# Patient Record
Sex: Female | Born: 1975 | Hispanic: Yes | Smoking: Never smoker
Health system: Southern US, Community
[De-identification: ages and names within clinical notes are randomized; demographics above are authoritative.]

## PROBLEM LIST (undated history)

## (undated) ENCOUNTER — Inpatient Hospital Stay (HOSPITAL_COMMUNITY): Payer: Self-pay

## (undated) DIAGNOSIS — R001 Bradycardia, unspecified: Secondary | ICD-10-CM

## (undated) DIAGNOSIS — D649 Anemia, unspecified: Secondary | ICD-10-CM

## (undated) DIAGNOSIS — T8332XA Displacement of intrauterine contraceptive device, initial encounter: Secondary | ICD-10-CM

## (undated) DIAGNOSIS — N83209 Unspecified ovarian cyst, unspecified side: Secondary | ICD-10-CM

## (undated) DIAGNOSIS — E039 Hypothyroidism, unspecified: Secondary | ICD-10-CM

## (undated) DIAGNOSIS — J189 Pneumonia, unspecified organism: Secondary | ICD-10-CM

## (undated) DIAGNOSIS — B9689 Other specified bacterial agents as the cause of diseases classified elsewhere: Secondary | ICD-10-CM

## (undated) DIAGNOSIS — K219 Gastro-esophageal reflux disease without esophagitis: Secondary | ICD-10-CM

## (undated) DIAGNOSIS — N939 Abnormal uterine and vaginal bleeding, unspecified: Secondary | ICD-10-CM

## (undated) DIAGNOSIS — T8389XA Other specified complication of genitourinary prosthetic devices, implants and grafts, initial encounter: Secondary | ICD-10-CM

## (undated) DIAGNOSIS — R7303 Prediabetes: Secondary | ICD-10-CM

## (undated) DIAGNOSIS — N760 Acute vaginitis: Secondary | ICD-10-CM

---
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
## 2001-06-30 ENCOUNTER — Ambulatory Visit (HOSPITAL_COMMUNITY): Admission: RE | Admit: 2001-06-30 | Discharge: 2001-06-30 | Payer: Self-pay | Admitting: *Deleted

## 2001-10-24 ENCOUNTER — Inpatient Hospital Stay (HOSPITAL_COMMUNITY): Admission: AD | Admit: 2001-10-24 | Discharge: 2001-10-24 | Payer: Self-pay | Admitting: *Deleted

## 2001-11-18 ENCOUNTER — Inpatient Hospital Stay (HOSPITAL_COMMUNITY): Admission: AD | Admit: 2001-11-18 | Discharge: 2001-11-20 | Payer: Self-pay | Admitting: *Deleted

## 2007-12-20 LAB — CONVERTED CEMR LAB: Pap Smear: NORMAL

## 2008-12-14 ENCOUNTER — Ambulatory Visit: Payer: Self-pay | Admitting: Diagnostic Radiology

## 2008-12-14 ENCOUNTER — Ambulatory Visit: Payer: Self-pay | Admitting: Internal Medicine

## 2008-12-14 ENCOUNTER — Emergency Department (HOSPITAL_BASED_OUTPATIENT_CLINIC_OR_DEPARTMENT_OTHER): Admission: EM | Admit: 2008-12-14 | Discharge: 2008-12-14 | Payer: Self-pay | Admitting: Emergency Medicine

## 2008-12-14 DIAGNOSIS — D649 Anemia, unspecified: Secondary | ICD-10-CM | POA: Insufficient documentation

## 2008-12-14 DIAGNOSIS — K8020 Calculus of gallbladder without cholecystitis without obstruction: Secondary | ICD-10-CM | POA: Insufficient documentation

## 2008-12-24 ENCOUNTER — Ambulatory Visit: Payer: Self-pay | Admitting: Diagnostic Radiology

## 2008-12-24 ENCOUNTER — Encounter (INDEPENDENT_AMBULATORY_CARE_PROVIDER_SITE_OTHER): Payer: Self-pay | Admitting: Surgery

## 2008-12-24 ENCOUNTER — Encounter: Payer: Self-pay | Admitting: Emergency Medicine

## 2008-12-24 ENCOUNTER — Inpatient Hospital Stay (HOSPITAL_COMMUNITY): Admission: EM | Admit: 2008-12-24 | Discharge: 2008-12-25 | Payer: Self-pay | Admitting: Surgery

## 2009-07-10 ENCOUNTER — Ambulatory Visit (HOSPITAL_COMMUNITY): Admission: RE | Admit: 2009-07-10 | Discharge: 2009-07-10 | Payer: Self-pay | Admitting: Internal Medicine

## 2009-07-17 ENCOUNTER — Encounter: Admission: RE | Admit: 2009-07-17 | Discharge: 2009-07-17 | Payer: Self-pay | Admitting: Internal Medicine

## 2010-03-23 HISTORY — PX: LAPAROSCOPIC CHOLECYSTECTOMY: SUR755

## 2010-06-26 LAB — COMPREHENSIVE METABOLIC PANEL
Albumin: 4 g/dL (ref 3.5–5.2)
BUN: 11 mg/dL (ref 6–23)
Calcium: 8.7 mg/dL (ref 8.4–10.5)
Glucose, Bld: 90 mg/dL (ref 70–99)
Total Protein: 7.4 g/dL (ref 6.0–8.3)

## 2010-06-26 LAB — CBC
HCT: 34.3 % — ABNORMAL LOW (ref 36.0–46.0)
Hemoglobin: 11.3 g/dL — ABNORMAL LOW (ref 12.0–15.0)
MCHC: 33 g/dL (ref 30.0–36.0)
Platelets: 297 10*3/uL (ref 150–400)
RDW: 16.5 % — ABNORMAL HIGH (ref 11.5–15.5)

## 2010-06-26 LAB — PROTIME-INR
INR: 0.9 (ref 0.00–1.49)
Prothrombin Time: 12.5 seconds (ref 11.6–15.2)

## 2010-06-26 LAB — URINALYSIS, ROUTINE W REFLEX MICROSCOPIC
Nitrite: NEGATIVE
Protein, ur: NEGATIVE mg/dL
Urobilinogen, UA: 0.2 mg/dL (ref 0.0–1.0)

## 2010-06-26 LAB — DIFFERENTIAL
Lymphocytes Relative: 44 % (ref 12–46)
Lymphs Abs: 2.7 10*3/uL (ref 0.7–4.0)
Monocytes Absolute: 0.5 10*3/uL (ref 0.1–1.0)
Monocytes Relative: 8 % (ref 3–12)
Neutro Abs: 2.8 10*3/uL (ref 1.7–7.7)
Neutrophils Relative %: 46 % (ref 43–77)

## 2010-06-26 LAB — URINE MICROSCOPIC-ADD ON

## 2010-06-26 LAB — PREGNANCY, URINE: Preg Test, Ur: NEGATIVE

## 2010-06-27 LAB — URINALYSIS, ROUTINE W REFLEX MICROSCOPIC
Ketones, ur: NEGATIVE mg/dL
Nitrite: NEGATIVE
Protein, ur: NEGATIVE mg/dL

## 2010-06-27 LAB — COMPREHENSIVE METABOLIC PANEL
Alkaline Phosphatase: 73 U/L (ref 39–117)
BUN: 13 mg/dL (ref 6–23)
Glucose, Bld: 99 mg/dL (ref 70–99)
Potassium: 3.9 mEq/L (ref 3.5–5.1)
Total Bilirubin: 0.5 mg/dL (ref 0.3–1.2)
Total Protein: 7.4 g/dL (ref 6.0–8.3)

## 2010-06-27 LAB — POCT CARDIAC MARKERS
CKMB, poc: <1 ng/mL — ABNORMAL LOW (ref 1.0–8.0)
Troponin i, poc: <0.05 ng/mL (ref 0.00–0.09)

## 2010-06-27 LAB — PREGNANCY, URINE: Preg Test, Ur: NEGATIVE

## 2010-06-27 LAB — DIFFERENTIAL
Basophils Absolute: 0 10*3/uL (ref 0.0–0.1)
Basophils Relative: 1 % (ref 0–1)
Monocytes Relative: 9 % (ref 3–12)
Neutro Abs: 4.2 10*3/uL (ref 1.7–7.7)
Neutrophils Relative %: 48 % (ref 43–77)

## 2010-06-27 LAB — CBC
HCT: 33.7 % — ABNORMAL LOW (ref 36.0–46.0)
Hemoglobin: 11.1 g/dL — ABNORMAL LOW (ref 12.0–15.0)
MCV: 77.2 fL — ABNORMAL LOW (ref 78.0–100.0)
RDW: 16.2 % — ABNORMAL HIGH (ref 11.5–15.5)

## 2011-01-31 ENCOUNTER — Encounter: Payer: Self-pay | Admitting: Emergency Medicine

## 2011-01-31 ENCOUNTER — Inpatient Hospital Stay (HOSPITAL_BASED_OUTPATIENT_CLINIC_OR_DEPARTMENT_OTHER)
Admission: AD | Admit: 2011-01-31 | Discharge: 2011-01-31 | Disposition: A | Discharge status: Home / Self Care | Payer: Self-pay | Attending: Obstetrics & Gynecology | Admitting: Obstetrics & Gynecology

## 2011-01-31 ENCOUNTER — Emergency Department (INDEPENDENT_AMBULATORY_CARE_PROVIDER_SITE_OTHER): Payer: Self-pay

## 2011-01-31 ENCOUNTER — Inpatient Hospital Stay (HOSPITAL_COMMUNITY): Payer: Self-pay

## 2011-01-31 DIAGNOSIS — R1032 Left lower quadrant pain: Secondary | ICD-10-CM

## 2011-01-31 DIAGNOSIS — B9689 Other specified bacterial agents as the cause of diseases classified elsewhere: Secondary | ICD-10-CM | POA: Insufficient documentation

## 2011-01-31 DIAGNOSIS — Z30431 Encounter for routine checking of intrauterine contraceptive device: Secondary | ICD-10-CM | POA: Insufficient documentation

## 2011-01-31 DIAGNOSIS — N83209 Unspecified ovarian cyst, unspecified side: Secondary | ICD-10-CM

## 2011-01-31 DIAGNOSIS — A499 Bacterial infection, unspecified: Secondary | ICD-10-CM | POA: Insufficient documentation

## 2011-01-31 DIAGNOSIS — R52 Pain, unspecified: Secondary | ICD-10-CM

## 2011-01-31 DIAGNOSIS — N760 Acute vaginitis: Secondary | ICD-10-CM | POA: Insufficient documentation

## 2011-01-31 LAB — CBC
MCV: 77 fL — ABNORMAL LOW (ref 78.0–100.0)
Platelets: 302 10*3/uL (ref 150–400)
RBC: 4.26 MIL/uL (ref 3.87–5.11)
WBC: 7.9 10*3/uL (ref 4.0–10.5)

## 2011-01-31 LAB — WET PREP, GENITAL: Trich, Wet Prep: NONE SEEN

## 2011-01-31 LAB — BASIC METABOLIC PANEL
CO2: 26 mEq/L (ref 19–32)
Chloride: 105 mEq/L (ref 96–112)
Creatinine, Ser: 0.7 mg/dL (ref 0.50–1.10)
Potassium: 3.9 mEq/L (ref 3.5–5.1)

## 2011-01-31 LAB — URINALYSIS, ROUTINE W REFLEX MICROSCOPIC
Hgb urine dipstick: NEGATIVE
Leukocytes, UA: NEGATIVE
Protein, ur: NEGATIVE mg/dL
Urobilinogen, UA: 0.2 mg/dL (ref 0.0–1.0)

## 2011-01-31 MED ORDER — IBUPROFEN 800 MG PO TABS: 800.0000 mg | ORAL_TABLET | Freq: Once | ORAL | Status: DC

## 2011-01-31 MED ORDER — IOHEXOL 300 MG/ML  SOLN: 100.0000 mL | Freq: Once | INTRAMUSCULAR | Status: DC | PRN

## 2011-01-31 MED ORDER — MORPHINE SULFATE 4 MG/ML IJ SOLN
4.0000 mg | Freq: Once | INTRAMUSCULAR | Status: AC
  Administered 2011-01-31: 4 mg via INTRAVENOUS
  Filled 2011-01-31: qty 1

## 2011-01-31 MED ORDER — ONDANSETRON HCL 4 MG/2ML IJ SOLN
4.0000 mg | Freq: Once | INTRAMUSCULAR | Status: AC
  Administered 2011-01-31: 4 mg via INTRAVENOUS
  Filled 2011-01-31: qty 2

## 2011-01-31 MED ORDER — HYDROCODONE-ACETAMINOPHEN 5-500 MG PO TABS: 1.0000 | ORAL_TABLET | ORAL | Status: AC | PRN

## 2011-01-31 NOTE — Progress Notes (Signed)
Pt came [facility] for an [place]. Pt presents [place] with complaints of left sided abdominal pain that she noticed 2 months ago and never went to the Dr. Rock Nephew says the pain was so bad today that she had to come to the Dr. The pain is constant and hurts worse when she is walking. Pt rates the pain 7/10

## 2011-01-31 NOTE — ED Notes (Signed)
Report to [place] at [facility].

## 2011-01-31 NOTE — ED Notes (Signed)
Pt c/o middle lower abd pain, onset this am; denies vaginal bleeding or d/c

## 2011-01-31 NOTE — ED Provider Notes (Signed)
US Transvaginal Non-ob  01/31/2011  *RADIOLOGY REPORT*  Clinical Data: Pelvic pain.  TRANSABDOMINAL AND TRANSVAGINAL ULTRASOUND OF PELVIS Technique:  Both transabdominal and transvaginal ultrasound examinations of the pelvis were performed. Transabdominal technique was performed for global imaging of the pelvis including uterus, ovaries, adnexal regions, and pelvic cul-de-sac.  Comparison: CT scan, same date.   It was necessary to proceed with endovaginal exam following the transabdominal exam to visualize the ovaries and endometrium.  Findings:  Uterus: Measures 9.1 x 5.5 x 5.9 cm.  No myometrial abnormalities.  Endometrium: Upper limits of normal in thickness measuring 13.3 mm. There is an IUD in the endometrial canal in the lower uterine segment.  It appears to protrude into the myometrium on both sides. In retrospect I think this is evident on the CT scan also.  Right ovary:  Measures 4.1 x 2.1 x 2.1 cm.  No cysts or masses. A corpus luteum cyst is noted.Normal blood flow.  Left ovary: Measures 3.8 x 2.0 x 3.2 cm.  No cysts or masses. Normal blood flow.  Other findings: A trace amount of free pelvic fluid is noted.  IMPRESSION:  1.  The IUD is in the lower uterine segment and the "T" portion appears to protrude into the myometrium on both sides. 2.  Normal ovaries. 3.  Trace free pelvic fluid.  Original Report Authenticated By: P. Loralie Champagne, M.D.   US Pelvis Complete  01/31/2011  *RADIOLOGY REPORT*  Clinical Data: Pelvic pain.  TRANSABDOMINAL AND TRANSVAGINAL ULTRASOUND OF PELVIS Technique:  Both transabdominal and transvaginal ultrasound examinations of the pelvis were performed. Transabdominal technique was performed for global imaging of the pelvis including uterus, ovaries, adnexal regions, and pelvic cul-de-sac.  Comparison: CT scan, same date.   It was necessary to proceed with endovaginal exam following the transabdominal exam to visualize the ovaries and endometrium.  Findings:  Uterus:  Measures 9.1 x 5.5 x 5.9 cm.  No myometrial abnormalities.  Endometrium: Upper limits of normal in thickness measuring 13.3 mm. There is an IUD in the endometrial canal in the lower uterine segment.  It appears to protrude into the myometrium on both sides. In retrospect I think this is evident on the CT scan also.  Right ovary:  Measures 4.1 x 2.1 x 2.1 cm.  No cysts or masses. A corpus luteum cyst is noted.Normal blood flow.  Left ovary: Measures 3.8 x 2.0 x 3.2 cm.  No cysts or masses. Normal blood flow.  Other findings: A trace amount of free pelvic fluid is noted.  IMPRESSION:  1.  The IUD is in the lower uterine segment and the "T" portion appears to protrude into the myometrium on both sides. 2.  Normal ovaries. 3.  Trace free pelvic fluid.  Original Report Authenticated By: P. Loralie Champagne, M.D.   Ct Abdomen Pelvis W Contrast  01/31/2011  *RADIOLOGY REPORT*  Clinical Data: Sudden onset left lower quadrant abdominal pain, prior cholecystectomy  CT ABDOMEN AND PELVIS WITH CONTRAST  Technique:  Multidetector CT imaging of the abdomen and pelvis was performed following the standard protocol during bolus administration of intravenous contrast.  Contrast:  100 ml Omnipaque-300 IV  Comparison: None.  Findings: Lung bases are clear.  Liver, spleen, pancreas, and adrenal glands are within normal limits.  Status post cholecystectomy.  No intrahepatic or extrahepatic ductal dilatation.  Kidneys within normal limits.  No hydronephrosis.  No evidence of bowel obstruction.  Normal appendix.  Mild colonic diverticulosis, without associated inflammatory changes.  No evidence of  abdominal aortic aneurysm.  No suspicious abdominopelvic lymphadenopathy.  Moderate pelvic ascites, possibly complicated by hemorrhage.  Uterus is mildly heterogeneous.  Indwelling IUD in the lower uterine segment. The right ovary is unremarkable.  Left ovary is notable for a 2.7x 2.0 cm collapsing corpus luteal cyst (series 2/image 62).   Bladder is within normal limits.  Visualized osseous structures are within normal limits.  IMPRESSION: 2.7 cm collapsing left ovarian corpus luteal cyst.  Moderate pelvic ascites, possibly complicated by hemorrhage.  IUD in the lower uterine segment.  Clinical correlation is suggested.  Original Report Authenticated By: Charline Bills, M.D.    In light of the newly discovered malpositioned IUD, if pt's pain is still persistent in 2-3 weeks, she is advised to seek treatment to have it replaced.  Dr. Marice Potter consulted.

## 2011-01-31 NOTE — ED Provider Notes (Signed)
History     CSN: [id number] Arrival date & time: 01/31/2011 10:57 AM   First MD Initiated Contact with Patient 01/31/11 1159      Chief Complaint  Patient presents with  . Abdominal Pain    (Consider location/radiation/quality/duration/timing/severity/associated sxs/prior treatment) HPI Comments: Pt c/o left lower abdominal pain that started this morning:pt states that she had a history of similar symptoms about 2 weeks ago and the symptoms resolved on there own:pt states that she was on her period last time and she had a large clot which she things may have been a miscarriage, but this time she is not on her period and the symptoms are worse:pt asked if she had a problem with her iud moving  Patient is a 35 y.o. female presenting with abdominal pain. The history is provided by the patient. No language interpreter was used.  Abdominal Pain The primary symptoms of the illness include abdominal pain. The primary symptoms of the illness do not include nausea, vomiting, dysuria, vaginal discharge or vaginal bleeding. The current episode started 6 to 12 hours ago. The onset of the illness was sudden. The problem has not changed since onset. The patient states that she believes she is currently not pregnant. The patient has not had a change in bowel habit. Symptoms associated with the illness do not include urgency, hematuria, frequency or back pain.    History reviewed. No pertinent past medical history.  History reviewed. No pertinent past surgical history.  No family history on file.  History  Substance Use Topics  . Smoking status: Never Smoker   . Smokeless tobacco: Not on file  . Alcohol Use: No    OB History    Grav Para Term Preterm Abortions TAB SAB Ect Mult Living                  Review of Systems  Gastrointestinal: Positive for abdominal pain. Negative for nausea and vomiting.  Genitourinary: Negative for dysuria, urgency, frequency, hematuria, vaginal bleeding and  vaginal discharge.  Musculoskeletal: Negative for back pain.  All other systems reviewed and are negative.    Allergies  Review of patient's allergies indicates no known allergies.  Home Medications  No current outpatient prescriptions on file.  BP 118/67  Pulse 71  Temp(Src) 97.6 F (36.4 C) (Oral)  Resp 18  Ht 5\' 3"  (1.6 m)  Wt 138 lb (62.596 kg)  BMI 24.45 kg/m2  SpO2 100%  LMP 12/31/2010  Physical Exam  Nursing note and vitals reviewed. Constitutional: She appears well-developed and well-nourished.  HENT:  Head: Normocephalic and atraumatic.  Eyes: Pupils are equal, round, and reactive to light.  Neck: Normal range of motion.  Cardiovascular: Normal rate and regular rhythm.   Pulmonary/Chest: Effort normal and breath sounds normal.  Abdominal: Soft. There is tenderness in the left lower quadrant.  Genitourinary: Cervix exhibits no motion tenderness. Vaginal discharge found.       Left adnexal tenderness noted  Musculoskeletal: Normal range of motion.  Neurological: She is alert.  Skin: Skin is warm and dry.  Psychiatric: She has a normal mood and affect.    ED Course  Procedures (including critical care time)  Labs Reviewed  WET PREP, GENITAL - Abnormal; Notable for the following:    Clue Cells, Wet Prep TOO NUMEROUS TO COUNT (*)    WBC, Wet Prep HPF POC TOO NUMEROUS TO COUNT (*)    All other components within normal limits  CBC - Abnormal; Notable for the following:  Hemoglobin 10.6 (*)    HCT 32.8 (*)    MCV 77.0 (*)    MCH 24.9 (*)    RDW 17.3 (*)    All other components within normal limits  PREGNANCY, URINE  URINALYSIS, ROUTINE W REFLEX MICROSCOPIC  BASIC METABOLIC PANEL  GC/CHLAMYDIA PROBE AMP, GENITAL  RPR   Ct Abdomen Pelvis W Contrast  01/31/2011  *RADIOLOGY REPORT*  Clinical Data: Sudden onset left lower quadrant abdominal pain, prior cholecystectomy  CT ABDOMEN AND PELVIS WITH CONTRAST  Technique:  Multidetector CT imaging of the  abdomen and pelvis was performed following the standard protocol during bolus administration of intravenous contrast.  Contrast:  100 ml Omnipaque-300 IV  Comparison: None.  Findings: Lung bases are clear.  Liver, spleen, pancreas, and adrenal glands are within normal limits.  Status post cholecystectomy.  No intrahepatic or extrahepatic ductal dilatation.  Kidneys within normal limits.  No hydronephrosis.  No evidence of bowel obstruction.  Normal appendix.  Mild colonic diverticulosis, without associated inflammatory changes.  No evidence of abdominal aortic aneurysm.  No suspicious abdominopelvic lymphadenopathy.  Moderate pelvic ascites, possibly complicated by hemorrhage.  Uterus is mildly heterogeneous.  Indwelling IUD in the lower uterine segment. The right ovary is unremarkable.  Left ovary is notable for a 2.7x 2.0 cm collapsing corpus luteal cyst (series 2/image 62).  Bladder is within normal limits.  Visualized osseous structures are within normal limits.  IMPRESSION: 2.7 cm collapsing left ovarian corpus luteal cyst.  Moderate pelvic ascites, possibly complicated by hemorrhage.  IUD in the lower uterine segment.  Clinical correlation is suggested.  Original Report Authenticated By: Charline Bills, M.D.     1. Bacterial vaginosis   2. Ovarian cyst       MDM  Pt going to be sent to [facility] to have ultrasound to rule our torsion        Teressa Lower, NP 01/31/11 1557

## 2011-01-31 NOTE — Progress Notes (Signed)
Midwife Drenda Freeze Cres-Dishmon made aware of pt's last Bp of 94/42, and states that it is running the same as previous BP's.

## 2011-01-31 NOTE — ED Provider Notes (Signed)
Pt sent from [facility] for further evaluation of LLQ pain.  Please see notes by Lonna Cobb, NP. Dr. Marice Potter consulted.  Ordered pelvic u/s to r/o ovarian torsion

## 2011-02-01 ENCOUNTER — Other Ambulatory Visit (HOSPITAL_COMMUNITY): Payer: Self-pay | Admitting: Advanced Practice Midwife

## 2011-02-01 DIAGNOSIS — R52 Pain, unspecified: Secondary | ICD-10-CM

## 2011-02-01 NOTE — ED Provider Notes (Signed)
Medical screening examination/treatment/procedure(s) were performed by non-physician practitioner and as supervising physician I was immediately available for consultation/collaboration.  Ethelda Chick, MD 02/01/11 [phone]

## 2011-02-02 LAB — GC/CHLAMYDIA PROBE AMP, GENITAL: GC Probe Amp, Genital: NEGATIVE

## 2011-03-02 ENCOUNTER — Ambulatory Visit (INDEPENDENT_AMBULATORY_CARE_PROVIDER_SITE_OTHER): Payer: Self-pay | Admitting: Advanced Practice Midwife

## 2011-03-02 ENCOUNTER — Encounter: Payer: Self-pay | Admitting: Advanced Practice Midwife

## 2011-03-02 DIAGNOSIS — Z30011 Encounter for initial prescription of contraceptive pills: Secondary | ICD-10-CM

## 2011-03-02 DIAGNOSIS — Z30432 Encounter for removal of intrauterine contraceptive device: Secondary | ICD-10-CM

## 2011-03-02 DIAGNOSIS — Z3009 Encounter for other general counseling and advice on contraception: Secondary | ICD-10-CM

## 2011-03-02 DIAGNOSIS — T8339XA Other mechanical complication of intrauterine contraceptive device, initial encounter: Secondary | ICD-10-CM

## 2011-03-02 MED ORDER — NORGESTIMATE-ETH ESTRADIOL 0.25-35 MG-MCG PO TABS: 1.0000 | ORAL_TABLET | Freq: Every day | ORAL | Status: DC

## 2011-03-02 NOTE — Patient Instructions (Addendum)
Levonorgestrel intrauterine device (IUD) Qu es este medicamento? El LEVONORGESTREL (DIU) es un dispositivo anticonceptivo (control de natalidad). Se utiliza para evitar el embarazo durante un perodo de hasta 5 aos. Este medicamento puede ser utilizado para otros usos; si tiene alguna pregunta consulte con su proveedor de atencin mdica o con su farmacutico. Qu le debo informar a mi profesional de la salud antes de tomar este medicamento? Necesita saber si usted presenta alguno de los siguientes problemas o situaciones: -exmen de Papanicolaou anormal -cncer de mama, cuello del tero o tero -diabetes -endometritis -si tiene una infeccin plvica o genital actual o en el pasado -tiene ms de una pareja sexual o si su pareja tiene ms de una pareja -enfermedad cardiaca -antecedente de embarazo tubrico o ectpico -problemas del sistema inmunolgico -DIU colocado -enfermedad heptica o tumor del hgado -problemas con la coagulacin o si toma diluyentes sanguneos -usa medicamentos intravenoso -forma inusual del tero -sangrado vaginal que no tiene explicacin -una reaccin alrgica o inusual al levonorgestrel, a otras hormonas, a la silicona o polietilenos, a otros medicamentos, alimentos, colorantes o conservantes -si est embarazada o buscando quedar embarazada -si est amamantando a un beb Cmo debo utilizar este medicamento? Un profesional de la salud coloca este dispositivo en el tero. Hable con su pediatra para informarse acerca del uso de este medicamento en nios. Puede requerir atencin especial. Sobredosis: Pngase en contacto inmediatamente con un centro toxicolgico o una sala de urgencia si usted cree que haya tomado demasiado medicamento. ATENCIN: Este medicamento es solo para usted. No comparta este medicamento con nadie. Qu sucede si me olvido de una dosis? No se aplica en este caso. Qu puede interactuar con este medicamento? No tome esta medicina con  ninguno de los siguientes medicamentos: -amprenavir -bosentano -fosamprenavir Esta medicina tambin puede interactuar con los siguientes medicamentos: -aprepitant -barbitricos para producir el sueo o para el tratamiento de convulsiones -bexaroteno -griseofulvina -medicamentos para tratar los convulsiones, tales como carbamazepina, etotona, felbamato, oxcarbazepina, fenitona, topiramato -modafinilo -pioglitazona -rifabutina -rifampicina -rifapentina -algunos medicamentos para tratar el virus VIH, tales como atazanavir, indinavir, lopinavir, nelfinavir, tipranavir, ritonavir -hierba de [place] -warfarina Puede ser que esta lista no menciona todas las posibles interacciones. Informe a su profesional de la salud de todos los productos a base de hierbas, medicamentos de venta libre o suplementos nutritivos que est tomando. Si usted fuma, consume bebidas alcohlicas o si utiliza drogas ilegales, indqueselo tambin a su profesional de la salud. Algunas sustancias pueden interactuar con su medicamento. A qu debo estar atento al usar este medicamento? Visite a su mdico o a su profesional de la salud para chequear su evolucin peridicamente. Visite a su mdico si usted o su pareja tiene relaciones sexuales con otras personas, se vuelve VIH positivo o contrae una enfermedad de transmisin sexual. Este medicamento no la protege de la infeccin por VIH (SIDA) ni de ninguna otra enfermedad de transmisin sexual. Puede controlar la ubicacin del DIU usted misma palpando con sus dedos limpios los hilos en la parte anterior de la vagina. No tire de los hilos. Es un buen hbito controlar la ubicacin del dispositivo despus de cada perodo menstrual. Si no slo siente los hilos sino que adems siente otra parte ms del DIU o si no puede sentir los hilos, consulte a su mdico inmediatamente. El DIU puede salirse por s solo. Puede quedar embarazada si el dispositivo se sale de su lugar. Utilice un  mtodo anticonceptivo adicional, como preservativos, y consulte a su proveedor de atencin mdica s observa que   el DIU se sali de su lugar. La utilizacin de tampones no cambia la posicin del DIU y no hay inconvenientes en usarlos durante su perodo. Qu efectos secundarios puedo tener al utilizar este medicamento? Efectos secundarios que debe informar a su mdico o a su profesional de la salud tan pronto como sea posible: -reacciones alrgicas como erupcin cutnea, picazn o urticarias, hinchazn de la cara, labios o lengua -fiebre, sntomas gripales -llagas genitales -alta presin sangunea -ausencia de un perodo menstrual durante 6 semanas mientras lo utiliza -dolor, hinchazn o calor en las piernas -dolor o sensibilidad del plvico -dolor de cabeza repentino o severo -signos de embarazo -calambres estomacales -falta de aliento repentina -problemas de coordinacin, del habla, al caminar -sangrado, flujo vaginal inusual -color amarillento de los ojos o la piel Efectos secundarios que, por lo general, no requieren atencin mdica (debe informarlos a su mdico o a su profesional de la salud si persisten o si son molestos): -acn -dolor de pecho -cambios en el deseo sexual o capacidad -cambios de peso -calambres, mareos o sensacin de desmayo mientras se introduce el dispositivo -dolor de cabeza -sangrado menstruales irregulares en los primeros 3 a 6 meses de usar -nuseas Puede ser que esta lista no menciona todos los posibles efectos secundarios. Comunquese a su mdico por asesoramiento mdico sobre los efectos secundarios. Usted puede informar los efectos secundarios a la FDA por telfono al 1-800-FDA-1088. Dnde debo guardar mi medicina? No se aplica en este caso. ATENCIN: Este folleto es un resumen. Puede ser que no cubra toda la posible informacin. Si usted tiene preguntas acerca de esta medicina, consulte con su mdico, su farmacutico o su profesional de la salud.   2012, Elsevier/Gold Standard. (05/21/2008 3:17:12 PM) 

## 2011-03-02 NOTE — Progress Notes (Signed)
  Subjective:    Patient ID: Paula Villegas, female    DOB: 1975/05/14, 35 y.o.   MRN: [id number]  HPI: Pt here for removal of Paragard IUD. Seen in [facility] 02/02/11 for abd pain. IUD found to be in lower uterine segment. Pain improved, but intermittent mild-mod pain persists. Desires Paragard removal and Mirena insertion. Up to date on Pap at health dept.    Review of Systems: Otherwise neg.     Objective:   Physical Exam: IUD strings and end of device seen at external os. Strings grasped w/ ring forceps. Removed w/out difficulty. Pt tolerated well.     Assessment & Plan:  Applied for financial assistance for Mirena. Will return for Mirena placement.  Rx Sprintec in interim.  Dorathy Kinsman 03/02/2011 5:08 PM

## 2011-05-22 ENCOUNTER — Encounter (HOSPITAL_COMMUNITY): Admission: EM | Disposition: A | Discharge status: Home / Self Care | Payer: Self-pay | Source: Home / Self Care

## 2011-05-22 ENCOUNTER — Emergency Department (HOSPITAL_COMMUNITY): Payer: Medicaid Other

## 2011-05-22 ENCOUNTER — Encounter (HOSPITAL_COMMUNITY): Payer: Self-pay | Admitting: *Deleted

## 2011-05-22 ENCOUNTER — Encounter (HOSPITAL_COMMUNITY): Payer: Self-pay

## 2011-05-22 ENCOUNTER — Emergency Department (HOSPITAL_COMMUNITY): Payer: Medicaid Other | Admitting: *Deleted

## 2011-05-22 ENCOUNTER — Observation Stay (HOSPITAL_COMMUNITY)
Admission: EM | Admit: 2011-05-22 | Discharge: 2011-05-23 | Disposition: A | Discharge status: Home / Self Care | Payer: Medicaid Other | Attending: Surgery | Admitting: Surgery

## 2011-05-22 DIAGNOSIS — K37 Unspecified appendicitis: Secondary | ICD-10-CM | POA: Diagnosis present

## 2011-05-22 DIAGNOSIS — K3580 Unspecified acute appendicitis: Secondary | ICD-10-CM

## 2011-05-22 LAB — CBC
Hemoglobin: 11.3 g/dL — ABNORMAL LOW (ref 12.0–15.0)
Platelets: 299 10*3/uL (ref 150–400)
RBC: 4.49 MIL/uL (ref 3.87–5.11)
WBC: 14.9 10*3/uL — ABNORMAL HIGH (ref 4.0–10.5)

## 2011-05-22 LAB — LIPASE, BLOOD: Lipase: 14 U/L (ref 11–59)

## 2011-05-22 LAB — COMPREHENSIVE METABOLIC PANEL
BUN: 12 mg/dL (ref 6–23)
CO2: 24 mEq/L (ref 19–32)
Calcium: 8.8 mg/dL (ref 8.4–10.5)
Chloride: 107 mEq/L (ref 96–112)
Creatinine, Ser: 0.88 mg/dL (ref 0.50–1.10)
GFR calc non Af Amer: 83 mL/min — ABNORMAL LOW (ref >90)
Total Bilirubin: 0.8 mg/dL (ref 0.3–1.2)

## 2011-05-22 LAB — URINALYSIS, ROUTINE W REFLEX MICROSCOPIC
Ketones, ur: 15 mg/dL — AB
Nitrite: NEGATIVE
Specific Gravity, Urine: 1.029 (ref 1.005–1.030)
pH: 6 (ref 5.0–8.0)

## 2011-05-22 LAB — DIFFERENTIAL
Lymphocytes Relative: 12 % (ref 12–46)
Lymphs Abs: 1.8 10*3/uL (ref 0.7–4.0)
Monocytes Relative: 11 % (ref 3–12)
Neutro Abs: 11.5 10*3/uL — ABNORMAL HIGH (ref 1.7–7.7)
Neutrophils Relative %: 77 % (ref 43–77)

## 2011-05-22 LAB — URINE MICROSCOPIC-ADD ON

## 2011-05-22 LAB — PREGNANCY, URINE: Preg Test, Ur: NEGATIVE

## 2011-05-22 SURGERY — APPENDECTOMY, LAPAROSCOPIC
Anesthesia: General | Site: Abdomen | Wound class: Contaminated

## 2011-05-22 MED ORDER — ONDANSETRON HCL 4 MG/2ML IJ SOLN
4.0000 mg | Freq: Once | INTRAMUSCULAR | Status: AC
  Administered 2011-05-22: 4 mg via INTRAVENOUS
  Filled 2011-05-22: qty 2

## 2011-05-22 MED ORDER — FENTANYL CITRATE 0.05 MG/ML IJ SOLN
INTRAMUSCULAR | Status: DC | PRN
  Administered 2011-05-22 (×2): 25 ug via INTRAVENOUS
  Administered 2011-05-22: 50 ug via INTRAVENOUS
  Administered 2011-05-22 (×2): 25 ug via INTRAVENOUS
  Administered 2011-05-22: 100 ug via INTRAVENOUS

## 2011-05-22 MED ORDER — ACETAMINOPHEN 650 MG RE SUPP: 650.0000 mg | Freq: Four times a day (QID) | RECTAL | Status: DC | PRN

## 2011-05-22 MED ORDER — ONDANSETRON HCL 4 MG/2ML IJ SOLN: 4.0000 mg | Freq: Four times a day (QID) | INTRAMUSCULAR | Status: DC | PRN

## 2011-05-22 MED ORDER — ACETAMINOPHEN 325 MG PO TABS
650.0000 mg | ORAL_TABLET | ORAL | Status: DC | PRN
  Administered 2011-05-23: 650 mg via ORAL

## 2011-05-22 MED ORDER — MIDAZOLAM HCL 5 MG/5ML IJ SOLN
INTRAMUSCULAR | Status: DC | PRN
  Administered 2011-05-22: 2 mg via INTRAVENOUS

## 2011-05-22 MED ORDER — GLYCOPYRROLATE 0.2 MG/ML IJ SOLN
INTRAMUSCULAR | Status: DC | PRN
  Administered 2011-05-22: .5 mg via INTRAVENOUS

## 2011-05-22 MED ORDER — HYDROMORPHONE HCL PF 2 MG/ML IJ SOLN
INTRAMUSCULAR | Status: AC
  Filled 2011-05-22: qty 1

## 2011-05-22 MED ORDER — SODIUM CHLORIDE 0.9 % IV BOLUS (SEPSIS)
1000.0000 mL | Freq: Once | INTRAVENOUS | Status: AC
  Administered 2011-05-22: 1000 mL via INTRAVENOUS

## 2011-05-22 MED ORDER — HYDROMORPHONE HCL PF 1 MG/ML IJ SOLN
1.0000 mg | INTRAMUSCULAR | Status: DC | PRN
  Administered 2011-05-22: 2 mg via INTRAVENOUS

## 2011-05-22 MED ORDER — KCL IN DEXTROSE-NACL 20-5-0.45 MEQ/L-%-% IV SOLN
INTRAVENOUS | Status: DC
  Administered 2011-05-22: 14:00:00 via INTRAVENOUS
  Filled 2011-05-22 (×5): qty 1000

## 2011-05-22 MED ORDER — HYDROMORPHONE HCL PF 1 MG/ML IJ SOLN
1.0000 mg | Freq: Once | INTRAMUSCULAR | Status: AC
  Administered 2011-05-22: 1 mg via INTRAVENOUS
  Filled 2011-05-22: qty 1

## 2011-05-22 MED ORDER — LACTATED RINGERS IV SOLN
INTRAVENOUS | Status: DC | PRN
  Administered 2011-05-22: 17:00:00 via INTRAVENOUS

## 2011-05-22 MED ORDER — BUPIVACAINE-EPINEPHRINE 0.25% -1:200000 IJ SOLN
INTRAMUSCULAR | Status: DC | PRN
  Administered 2011-05-22: 20 mL

## 2011-05-22 MED ORDER — DIPHENHYDRAMINE HCL 12.5 MG/5ML PO ELIX
12.5000 mg | ORAL_SOLUTION | Freq: Four times a day (QID) | ORAL | Status: DC | PRN
  Filled 2011-05-22: qty 10

## 2011-05-22 MED ORDER — ROCURONIUM BROMIDE 100 MG/10ML IV SOLN
INTRAVENOUS | Status: DC | PRN
  Administered 2011-05-22: 30 mg via INTRAVENOUS

## 2011-05-22 MED ORDER — LIDOCAINE HCL (CARDIAC) 20 MG/ML IV SOLN
INTRAVENOUS | Status: DC | PRN
  Administered 2011-05-22: 50 mg via INTRAVENOUS

## 2011-05-22 MED ORDER — ONDANSETRON HCL 4 MG/2ML IJ SOLN
INTRAMUSCULAR | Status: DC | PRN
  Administered 2011-05-22: 4 mg via INTRAVENOUS

## 2011-05-22 MED ORDER — HYDROMORPHONE HCL PF 1 MG/ML IJ SOLN
1.0000 mg | INTRAMUSCULAR | Status: DC | PRN
  Administered 2011-05-22: 1 mg via INTRAVENOUS
  Filled 2011-05-22: qty 1

## 2011-05-22 MED ORDER — SODIUM CHLORIDE 0.9 % IR SOLN
Status: DC | PRN
  Administered 2011-05-22: 1000 mL

## 2011-05-22 MED ORDER — ONDANSETRON HCL 4 MG PO TABS: 4.0000 mg | ORAL_TABLET | Freq: Four times a day (QID) | ORAL | Status: DC | PRN

## 2011-05-22 MED ORDER — HYDROCODONE-ACETAMINOPHEN 5-325 MG PO TABS: 1.0000 | ORAL_TABLET | ORAL | Status: DC | PRN

## 2011-05-22 MED ORDER — KETOROLAC TROMETHAMINE 30 MG/ML IJ SOLN
INTRAMUSCULAR | Status: DC | PRN
  Administered 2011-05-22: 30 mg via INTRAVENOUS

## 2011-05-22 MED ORDER — HYDROMORPHONE HCL PF 1 MG/ML IJ SOLN
0.2500 mg | INTRAMUSCULAR | Status: DC | PRN
  Administered 2011-05-22: 0.5 mg via INTRAVENOUS

## 2011-05-22 MED ORDER — ONDANSETRON HCL 4 MG/2ML IJ SOLN: 4.0000 mg | Freq: Once | INTRAMUSCULAR | Status: DC | PRN

## 2011-05-22 MED ORDER — SODIUM CHLORIDE 0.9 % IV SOLN
1.0000 g | INTRAVENOUS | Status: DC
  Administered 2011-05-22: 1 g via INTRAVENOUS
  Filled 2011-05-22 (×2): qty 1

## 2011-05-22 MED ORDER — ONDANSETRON HCL 4 MG/2ML IJ SOLN
4.0000 mg | Freq: Four times a day (QID) | INTRAMUSCULAR | Status: DC | PRN
  Administered 2011-05-22: 4 mg via INTRAVENOUS
  Filled 2011-05-22: qty 2

## 2011-05-22 MED ORDER — ACETAMINOPHEN 325 MG PO TABS
650.0000 mg | ORAL_TABLET | Freq: Four times a day (QID) | ORAL | Status: DC | PRN
  Filled 2011-05-22: qty 2

## 2011-05-22 MED ORDER — METOCLOPRAMIDE HCL 5 MG/ML IJ SOLN
INTRAMUSCULAR | Status: DC | PRN
  Administered 2011-05-22: 10 mg via INTRAVENOUS

## 2011-05-22 MED ORDER — IOHEXOL 300 MG/ML  SOLN
80.0000 mL | Freq: Once | INTRAMUSCULAR | Status: AC | PRN
  Administered 2011-05-22: 80 mL via INTRAVENOUS

## 2011-05-22 MED ORDER — KCL IN DEXTROSE-NACL 20-5-0.45 MEQ/L-%-% IV SOLN
INTRAVENOUS | Status: DC
  Administered 2011-05-22: 20:00:00 via INTRAVENOUS
  Filled 2011-05-22 (×3): qty 1000

## 2011-05-22 MED ORDER — NEOSTIGMINE METHYLSULFATE 1 MG/ML IJ SOLN
INTRAMUSCULAR | Status: DC | PRN
  Administered 2011-05-22: 3 mg via INTRAVENOUS

## 2011-05-22 MED ORDER — PROPOFOL 10 MG/ML IV EMUL
INTRAVENOUS | Status: DC | PRN
  Administered 2011-05-22: 200 mg via INTRAVENOUS

## 2011-05-22 MED ORDER — DEXAMETHASONE SODIUM PHOSPHATE 4 MG/ML IJ SOLN
INTRAMUSCULAR | Status: DC | PRN
  Administered 2011-05-22: 4 mg via INTRAVENOUS

## 2011-05-22 MED ORDER — DIPHENHYDRAMINE HCL 50 MG/ML IJ SOLN: 12.5000 mg | Freq: Four times a day (QID) | INTRAMUSCULAR | Status: DC | PRN

## 2011-05-22 SURGICAL SUPPLY — 46 items
APL SKNCLS STERI-STRIP NONHPOA (GAUZE/BANDAGES/DRESSINGS) ×1
APPLIER CLIP ROT 10 11.4 M/L (STAPLE)
APR CLP MED LRG 11.4X10 (STAPLE)
BAG SPEC RTRVL LRG 6X4 10 (ENDOMECHANICALS) ×1
BENZOIN TINCTURE PRP APPL 2/3 (GAUZE/BANDAGES/DRESSINGS) ×2 IMPLANT
CANISTER SUCTION 2500CC (MISCELLANEOUS) ×2 IMPLANT
CHLORAPREP W/TINT 26ML (MISCELLANEOUS) ×2 IMPLANT
CLIP APPLIE ROT 10 11.4 M/L (STAPLE) IMPLANT
CLOSURE STERI STRIP 1/2 X4 (GAUZE/BANDAGES/DRESSINGS) ×1 IMPLANT
CLOTH BEACON ORANGE TIMEOUT ST (SAFETY) ×2 IMPLANT
COVER SURGICAL LIGHT HANDLE (MISCELLANEOUS) ×2 IMPLANT
CUTTER LINEAR ENDO 35 ETS (STAPLE) IMPLANT
CUTTER LINEAR ENDO 35 ETS TH (STAPLE) IMPLANT
DECANTER SPIKE VIAL GLASS SM (MISCELLANEOUS) ×2 IMPLANT
DRAPE UTILITY 15X26 W/TAPE STR (DRAPE) ×4 IMPLANT
DRSG TEGADERM 2.38X2.75 (GAUZE/BANDAGES/DRESSINGS) ×1 IMPLANT
ELECT REM PT RETURN 9FT ADLT (ELECTROSURGICAL) ×2
ELECTRODE REM PT RTRN 9FT ADLT (ELECTROSURGICAL) ×1 IMPLANT
ENDOLOOP SUT PDS II  0 18 (SUTURE)
ENDOLOOP SUT PDS II 0 18 (SUTURE) IMPLANT
GAUZE SPONGE 2X2 8PLY STRL LF (GAUZE/BANDAGES/DRESSINGS) ×1 IMPLANT
GLOVE BIO SURGEON STRL SZ 6.5 (GLOVE) ×2 IMPLANT
GLOVE SURG ORTHO 8.0 STRL STRW (GLOVE) ×2 IMPLANT
GOWN BRE IMP SLV AUR LG STRL (GOWN DISPOSABLE) ×1 IMPLANT
GOWN STRL NON-REIN LRG LVL3 (GOWN DISPOSABLE) ×4 IMPLANT
GOWN STRL REIN XL XLG (GOWN DISPOSABLE) ×2 IMPLANT
KIT BASIN OR (CUSTOM PROCEDURE TRAY) ×2 IMPLANT
KIT ROOM TURNOVER OR (KITS) ×2 IMPLANT
NS IRRIG 1000ML POUR BTL (IV SOLUTION) ×2 IMPLANT
PAD ARMBOARD 7.5X6 YLW CONV (MISCELLANEOUS) ×4 IMPLANT
POUCH SPECIMEN RETRIEVAL 10MM (ENDOMECHANICALS) ×2 IMPLANT
RELOAD /EVU35 (ENDOMECHANICALS) IMPLANT
RELOAD CUTTER ETS 35MM STAND (ENDOMECHANICALS) IMPLANT
SCALPEL HARMONIC ACE (MISCELLANEOUS) ×2 IMPLANT
SET IRRIG TUBING LAPAROSCOPIC (IRRIGATION / IRRIGATOR) ×2 IMPLANT
SPECIMEN JAR SMALL (MISCELLANEOUS) ×2 IMPLANT
SPONGE GAUZE 2X2 STER 10/PKG (GAUZE/BANDAGES/DRESSINGS) ×1
SUT MNCRL AB 4-0 PS2 18 (SUTURE) ×2 IMPLANT
TOWEL OR 17X24 6PK STRL BLUE (TOWEL DISPOSABLE) ×2 IMPLANT
TOWEL OR 17X26 10 PK STRL BLUE (TOWEL DISPOSABLE) ×2 IMPLANT
TRAY FOLEY CATH 14FR (SET/KITS/TRAYS/PACK) ×2 IMPLANT
TRAY LAPAROSCOPIC (CUSTOM PROCEDURE TRAY) ×2 IMPLANT
TROCAR XCEL BLUNT TIP 100MML (ENDOMECHANICALS) ×2 IMPLANT
TROCAR Z-THREAD FIOS 11X100 BL (TROCAR) ×2 IMPLANT
TROCAR Z-THREAD FIOS 5X100MM (TROCAR) ×2 IMPLANT
WATER STERILE IRR 1000ML POUR (IV SOLUTION) IMPLANT

## 2011-05-22 NOTE — ED Notes (Signed)
Pt to ct 

## 2011-05-22 NOTE — ED Notes (Signed)
Trauma PA at bedside, to be admitted and taken to OR.

## 2011-05-22 NOTE — H&P (Signed)
[facility] ([facility]) - ATTENDING: Patient seen and examined in the OR holding area.  EHR reviewed.  The risks and benefits of the procedure have been discussed at length with the patient.  The patient understands the proposed procedure, potential alternative treatments, and the course of recovery to be expected.  All of the patient's questions have been answered at this time.  The patient wishes to proceed with surgery.  Velora Heckler, MD, [facility] Office: [phone]

## 2011-05-22 NOTE — ED Notes (Signed)
[id number] Ready 

## 2011-05-22 NOTE — Preoperative (Signed)
Beta Blockers   Reason not to administer Beta Blockers:Not Applicable 

## 2011-05-22 NOTE — Transfer of Care (Signed)
Immediate Anesthesia Transfer of Care Note  Patient: Paula Villegas  Procedure(s) Performed: Procedure(s) (LRB): APPENDECTOMY LAPAROSCOPIC (N/A)  Patient Location: PACU  Anesthesia Type: General  Level of Consciousness: alert , oriented, patient cooperative and responds to stimulation  Airway & Oxygen Therapy: Patient Spontanous Breathing and Patient connected to nasal cannula oxygen  Post-op Assessment: Report given to PACU RN and Post -op Vital signs reviewed and stable  Post vital signs: Reviewed  Complications: No apparent anesthesia complications

## 2011-05-22 NOTE — ED Provider Notes (Signed)
History     CSN: [id number]  Arrival date & time 05/22/11  0865   First MD Initiated Contact with Patient 05/22/11 0900      No chief complaint on file.   HPI This generally well female presents with one day of abdominal pain.  He notes that his symptoms began gradually, approximately 24 hours ago.  Since onset the pain has been persistent.  The pain is sharp.  The pain is focally in the right lower quadrant.  Over the past few hours the pain has radiated towards her umbilicus.  No relief with anything.  The pain seems worse with particular positions.  She also complains of nausea.  She denies vomiting or diarrhea.  She denies fevers, chills, cough, dyspnea.  She also denies dysuria, vaginal complaints. The patient was evaluated for abdominal pain several months ago.  She notes that this pain is dissimilar to pain. She has a distant history of cholecystectomy. Past Medical History  Diagnosis Date  . No pertinent past medical history     Past Surgical History  Procedure Date  . Cholecystectomy     Family History  Problem Relation Age of Onset  . Hypertension Mother     History  Substance Use Topics  . Smoking status: Never Smoker   . Smokeless tobacco: Never Used  . Alcohol Use: No    OB History    Grav Para Term Preterm Abortions TAB SAB Ect Mult Living   4 3 2 1 1  1   3       Review of Systems  Constitutional:       HPI  HENT:       HPI otherwise negative  Eyes: Negative.   Respiratory:       HPI, otherwise negative  Cardiovascular:       HPI, otherwise nmegative  Gastrointestinal: Negative for vomiting.  Genitourinary:       HPI, otherwise negative  Musculoskeletal:       HPI, otherwise negative  Skin: Negative.   Neurological: Negative for syncope.    Allergies  Review of patient's allergies indicates no known allergies.  Home Medications  No current outpatient prescriptions on file.  There were no vitals taken for this visit.  Physical Exam    Nursing note and vitals reviewed. Constitutional: She is oriented to person, place, and time. She appears well-developed and well-nourished. No distress.  HENT:  Head: Normocephalic and atraumatic.  Eyes: Conjunctivae and EOM are normal.  Cardiovascular: Normal rate and regular rhythm.   Pulmonary/Chest: Effort normal and breath sounds normal. No stridor. No respiratory distress.  Abdominal: Normal appearance. She exhibits no distension. There is tenderness in the right upper quadrant, right lower quadrant and periumbilical area. There is guarding and tenderness at McBurney's point. There is no rigidity, no rebound and no CVA tenderness. No hernia.  Musculoskeletal: She exhibits no edema.  Neurological: She is alert and oriented to person, place, and time. No cranial nerve deficit.  Skin: Skin is warm and dry.  Psychiatric: She has a normal mood and affect.   Assessment/Plan: Rule out appendicitis.  Symptoms may be due to GU etiology as well. ED Course  Procedures (including critical care time)   Labs Reviewed  COMPREHENSIVE METABOLIC PANEL  CBC  DIFFERENTIAL  LIPASE, BLOOD  URINALYSIS, ROUTINE W REFLEX MICROSCOPIC  PREGNANCY, URINE   No results found.   No diagnosis found.   CT Reviewed by me  Pulse ox 100% ra -normal MDM  This generally  well 36 year old female presents with one day of abdominal pain, nausea.  On exam she is tenderness in her right lower quadrant.  The patient has leukocytosis.  Given her description of pain, her physical exam findings there was concern for appendicitis.  The patient's CT scan demonstrates this entity surgery has been called to evaluate the patient.        Gerhard Munch, MD 05/22/11 [phone]

## 2011-05-22 NOTE — Op Note (Signed)
Paula Villegas, SPEICH           ACCOUNT NO.:  [id number]  MEDICAL RECORD NO.:  [id number]  LOCATION:  5118                         FACILITY:  [facility]  PHYSICIAN:  Velora Heckler, MD      DATE OF BIRTH:  January 23, 1976  DATE OF PROCEDURE:  05/22/2011                               OPERATIVE REPORT   PREOPERATIVE DIAGNOSIS:  Acute appendicitis.  POSTOPERATIVE DIAGNOSIS:  Acute appendicitis.  PROCEDURE:  Laparoscopic appendectomy.  SURGEON:  Velora Heckler, MD, FACS  ANESTHESIA:  General.  ESTIMATED BLOOD LOSS:  Minimal.  PREPARATION:  ChloraPrep.  COMPLICATIONS:  None.  INDICATIONS:  The patient is a 36 year old Hispanic female who presented to the emergency department with 24-hour history of abdominal pain localizing to the right lower quadrant.  CT scan of abdomen and pelvis confirmed findings consistent with acute appendicitis.  The patient was prepared for the operating room.  BODY OF REPORT:  Procedure was done in OR #17 at the [facility].  The patient was brought to the operating room, placed in supine position on the operating room table.  Following administration of general anesthesia, the patient was positioned and then prepped and draped in the usual strict aseptic fashion.  After ascertaining that an adequate level of anesthesia had been achieved, an infraumbilical incision was reopened with a #15 blade.  Dissection was carried down to the fascia.  Fascia was incised in the midline, and the peritoneal cavity was entered cautiously.  A 0 Vicryl pursestring suture was placed in the fascia.  An Hasson cannula was introduced under direct vision and secured with a pursestring suture.  Abdomen was insufflated with carbon dioxide.  Laparoscope was introduced and the abdomen explored.  Operative ports were placed in the right upper quadrant and left lower quadrant.  There is an inflammatory process in the right lower quadrant.  There is adherent omentum.   Omentum was mobilized gently with blunt dissection.  The appendix is adhered to the lateral pelvic sidewall.  It was gently mobilized with blunt dissection. Appendix was closely approximated to the wall of the cecum.  With care, the mesoappendix was divided with the Harmonic Scalpel.  Appendix was dissected away from the cecal wall.  Remainder of the mesoappendix was divided with the Harmonic scalpel.  Dissection was carried down to the base of the appendix.  The base of the appendix was transected at its junction with the cecal wall with the Endo-GIA stapler.  The staple line appears complete with good hemostasis.  Remainder of the mesoappendix was then transected with the Harmonic scalpel, and the appendix was completely freed from the right lower quadrant.  It was inserted into an EndoCatch bag and withdrawn through the umbilical port without difficulty.  It was submitted to Pathology for review.  Right lower quadrant was irrigated with warm saline.  Good hemostasis was noted.  Fluid was evacuated.  Staple line shows good hemostasis. Cecal wall appears intact without any evidence of thermal injury. Pneumoperitoneum was released.  Ports were removed under direct vision. Good hemostasis was noted at all port sites.  0 Vicryl pursestring suture at the umbilicus was tied securely.  Wounds were anesthetized with local anesthetic.  Skin incisions were closed with interrupted 4-0 Monocryl subcuticular sutures.  Wounds were washed and dried, and benzoin Steri-Strips were applied.  Sterile dressings were applied.  The patient was awakened from anesthesia and brought to the recovery room. The patient tolerated the procedure well.   Velora Heckler, MD, FACS  TMG/MEDQ  D:  05/22/2011  T:  05/22/2011  Job:  [id number]

## 2011-05-22 NOTE — ED Notes (Signed)
PT TO OR.

## 2011-05-22 NOTE — ED Notes (Signed)
Pt complains of abd apin in right lower quad and center of abd. Denies n/v/d, urinary complaints or vaginal complaints. Gall bladder removed.

## 2011-05-22 NOTE — H&P (Signed)
Chief Complaint: Abd pain HPI: Paula Villegas is an 36 y.o. female who began having abd pain yesterday morning. It was mostly in the mid low abdomen. She had a BM but no better. She has been nauseated, but not vomited. She denies fever, chills. The pain worsened and focalized to the (R)LQ and she came to the ER this morning. Workup finds evidence of appendicitis on CT and labs. Surgery eval requested.  Past Medical History:  Past Medical History  Diagnosis Date  . No pertinent past medical history     Past Surgical History:  Past Surgical History  Procedure Date  . Cholecystectomy, October 2010-Dr. Cornett     Family History:  Family History  Problem Relation Age of Onset  . Hypertension Mother     Social History:  reports that she has never smoked. She has never used smokeless tobacco. She reports that she does not drink alcohol or use illicit drugs.  Allergies: No Known Allergies  Medications: No current outpatient prescriptions on file as of 05/22/2011.    Please HPI for pertinent positives, otherwise complete 10 system ROS negative.  Blood pressure 107/63, pulse 75, temperature 98.5 F (36.9 C), temperature source Oral, resp. rate 16, SpO2 99.00%. There is no height or weight on file to calculate BMI.   General Appearance:  Alert, cooperative, no distress, appears stated age, nontoxic  Head:  Normocephalic, without obvious abnormality, atraumatic  ENT: Unremarkable  Neck: Supple, symmetrical, trachea midline, no adenopathy, thyroid: not enlarged, symmetric, no tenderness/mass/nodules  Lungs:   Clear to auscultation bilaterally, no w/r/r, respirations unlabored without use of accessory muscles.  Chest Wall:  No tenderness or deformity  Heart:  Regular rate and rhythm, S1, S2 normal, no murmur, rub or gallop. Carotids 2+ without bruit.  Abdomen:   Soft. Flat, but point tender RLQ with +guarding and rebound. Bowel sounds active all four quadrants,  no masses, no  organomegaly.  Genitalia:  Normal. No hernias  Rectal:  Deferred.  Extremities: Extremities normal, atraumatic, no cyanosis or edema  Pulses: 2+ and symmetric  Skin: Skin color, texture, turgor normal, no rashes or lesions  Neurologic: Normal affect, no gross deficits.   Results for orders placed during the hospital encounter of 05/22/11 (from the past 48 hour(s))  COMPREHENSIVE METABOLIC PANEL     Status: Abnormal   Collection Time   05/22/11  9:16 AM      Component Value Range Comment   Sodium 139  135 - 145 (mEq/L)    Potassium 3.8  3.5 - 5.1 (mEq/L)    Chloride 107  96 - 112 (mEq/L)    CO2 24  19 - 32 (mEq/L)    Glucose, Bld 114 (*) 70 - 99 (mg/dL)    BUN 12  6 - 23 (mg/dL)    Creatinine, Ser 4.09  0.50 - 1.10 (mg/dL)    Calcium 8.8  8.4 - 10.5 (mg/dL)    Total Protein 7.5  6.0 - 8.3 (g/dL)    Albumin 3.4 (*) 3.5 - 5.2 (g/dL)    AST 12  0 - 37 (U/L)    ALT 7  0 - 35 (U/L)    Alkaline Phosphatase 52  39 - 117 (U/L)    Total Bilirubin 0.8  0.3 - 1.2 (mg/dL)    GFR calc non Af Amer 83 (*) >90 (mL/min)    GFR calc Af Amer >90  >90 (mL/min)   CBC     Status: Abnormal   Collection Time  05/22/11  9:16 AM      Component Value Range Comment   WBC 14.9 (*) 4.0 - 10.5 (K/uL)    RBC 4.49  3.87 - 5.11 (MIL/uL)    Hemoglobin 11.3 (*) 12.0 - 15.0 (g/dL)    HCT 91.4 (*) 78.2 - 46.0 (%)    MCV 76.6 (*) 78.0 - 100.0 (fL)    MCH 25.2 (*) 26.0 - 34.0 (pg)    MCHC 32.8  30.0 - 36.0 (g/dL)    RDW 95.6 (*) 21.3 - 15.5 (%)    Platelets 299  150 - 400 (K/uL)   DIFFERENTIAL     Status: Abnormal   Collection Time   05/22/11  9:16 AM      Component Value Range Comment   Neutrophils Relative 77  43 - 77 (%)    Neutro Abs 11.5 (*) 1.7 - 7.7 (K/uL)    Lymphocytes Relative 12  12 - 46 (%)    Lymphs Abs 1.8  0.7 - 4.0 (K/uL)    Monocytes Relative 11  3 - 12 (%)    Monocytes Absolute 1.6 (*) 0.1 - 1.0 (K/uL)    Eosinophils Relative 0  0 - 5 (%)    Eosinophils Absolute 0.0  0.0 - 0.7 (K/uL)     Basophils Relative 0  0 - 1 (%)    Basophils Absolute 0.0  0.0 - 0.1 (K/uL)   LIPASE, BLOOD     Status: Normal   Collection Time   05/22/11  9:16 AM      Component Value Range Comment   Lipase 14  11 - 59 (U/L)   URINALYSIS, ROUTINE W REFLEX MICROSCOPIC     Status: Abnormal   Collection Time   05/22/11  9:35 AM      Component Value Range Comment   Color, Urine AMBER (*) YELLOW  BIOCHEMICALS MAY BE AFFECTED BY COLOR   APPearance CLOUDY (*) CLEAR     Specific Gravity, Urine 1.029  1.005 - 1.030     pH 6.0  5.0 - 8.0     Glucose, UA NEGATIVE  NEGATIVE (mg/dL)    Hgb urine dipstick NEGATIVE  NEGATIVE     Bilirubin Urine SMALL (*) NEGATIVE     Ketones, ur 15 (*) NEGATIVE (mg/dL)    Protein, ur NEGATIVE  NEGATIVE (mg/dL)    Urobilinogen, UA 0.2  0.0 - 1.0 (mg/dL)    Nitrite NEGATIVE  NEGATIVE     Leukocytes, UA LARGE (*) NEGATIVE    PREGNANCY, URINE     Status: Normal   Collection Time   05/22/11  9:35 AM      Component Value Range Comment   Preg Test, Ur NEGATIVE  NEGATIVE    URINE MICROSCOPIC-ADD ON     Status: Abnormal   Collection Time   05/22/11  9:35 AM      Component Value Range Comment   Squamous Epithelial / LPF MANY (*) RARE     WBC, UA 21-50  <3 (WBC/hpf)    RBC / HPF 0-2  <3 (RBC/hpf)    Bacteria, UA RARE  RARE     Urine-Other MUCOUS PRESENT      Ct Abdomen Pelvis W Contrast  05/22/2011  *RADIOLOGY REPORT*  Clinical Data: Right lower quadrant pain, question appendicitis.  CT ABDOMEN AND PELVIS WITH CONTRAST  Technique:  Multidetector CT imaging of the abdomen and pelvis was performed following the standard protocol during bolus administration of intravenous contrast.  Contrast: 80mL OMNIPAQUE IOHEXOL 300 MG/ML  IJ SOLN  Comparison:  01/31/2011  Findings: Lung bases are clear.  No effusions.  Heart is normal size.  Prior cholecystectomy.  No focal lesion in the liver.  The appendix is visualized.  The mid to distal portion of the appendix is dilated with surrounding inflammatory  change compatible with acute appendicitis.  Distal appendix measures up to 12 mm.  Spleen, pancreas, adrenals and kidneys are normal.  Trace free fluid in the pelvis.  Uterus and adnexa as well as urinary bladder grossly unremarkable.  No acute bony abnormality  IMPRESSION: Mid to distal appendix is dilated with surrounding inflammatory change compatible with acute appendicitis.  Original Report Authenticated By: Cyndie Chime, M.D.    Assessment/Plan Principal Problem:  *Appendicitis Will start IV abx and IVF Needs to go to OR for appendectomy. Explained procedure and risks. Explained post-op course and limitations. Will obtain informed consent.   Marianna Fuss PA-C 05/22/2011, 12:33 PM

## 2011-05-22 NOTE — Anesthesia Preprocedure Evaluation (Signed)
Anesthesia Evaluation  Patient identified by MRN, date of birth, ID band Patient awake    Reviewed: Allergy & Precautions, H&P , NPO status , Patient's Chart, lab work & pertinent test results  Airway Mallampati: I      Dental   Pulmonary  breath sounds clear to auscultation        Cardiovascular Rhythm:Regular Rate:Normal     Neuro/Psych    GI/Hepatic   Endo/Other    Renal/GU      Musculoskeletal   Abdominal   Peds  Hematology   Anesthesia Other Findings   Reproductive/Obstetrics                           Anesthesia Physical Anesthesia Plan  ASA: I  Anesthesia Plan: General   Post-op Pain Management:    Induction: Intravenous  Airway Management Planned: Oral ETT  Additional Equipment:   Intra-op Plan:   Post-operative Plan: Extubation in OR  Informed Consent: I have reviewed the patients History and Physical, chart, labs and discussed the procedure including the risks, benefits and alternatives for the proposed anesthesia with the patient or authorized representative who has indicated his/her understanding and acceptance.   Dental advisory given  Plan Discussed with:   Anesthesia Plan Comments:         Anesthesia Quick Evaluation

## 2011-05-22 NOTE — Brief Op Note (Signed)
05/22/2011  6:20 PM  PATIENT:  Paula Villegas  36 y.o. female  PRE-OPERATIVE DIAGNOSIS:  Acute Appendicitis  POST-OPERATIVE DIAGNOSIS:  Acute Appendicitis  PROCEDURE:  Procedure(s) (LRB): APPENDECTOMY LAPAROSCOPIC (N/A)  SURGEON:  Surgeon(s) and Role:    * Velora Heckler, MD - Primary  ASSISTANTS: none   ANESTHESIA:   general  EBL:  Total I/O In: 2000 [I.V.:2000] Out: -   BLOOD ADMINISTERED:none  DRAINS: none   LOCAL MEDICATIONS USED:  MARCAINE     SPECIMEN:  Excision  DISPOSITION OF SPECIMEN:  PATHOLOGY  COUNTS:  YES  TOURNIQUET:  * No tourniquets in log *  DICTATION: .Other Dictation: Dictation Number [id number]  PLAN OF CARE: Admit for overnight observation  PATIENT DISPOSITION:  PACU - hemodynamically stable.   Delay start of Pharmacological VTE agent (>24hrs) due to surgical blood loss or risk of bleeding: yes  Velora Heckler, MD, [facility] Office: [phone]

## 2011-05-22 NOTE — Anesthesia Postprocedure Evaluation (Signed)
  Anesthesia Post-op Note  Patient: Paula Villegas  Procedure(s) Performed: Procedure(s) (LRB): APPENDECTOMY LAPAROSCOPIC (N/A)  Patient Location: PACU  Anesthesia Type: General  Level of Consciousness: awake  Airway and Oxygen Therapy: Patient Spontanous Breathing  Post-op Pain: mild  Post-op Assessment: Post-op Vital signs reviewed  Post-op Vital Signs: stable  Complications: No apparent anesthesia complications

## 2011-05-22 NOTE — Anesthesia Procedure Notes (Signed)
Procedure Name: Intubation Date/Time: 05/22/2011 5:34 PM Performed by: Wray Kearns A Pre-anesthesia Checklist: Patient identified, Timeout performed, Emergency Drugs available, Suction available and Patient being monitored Patient Re-evaluated:Patient Re-evaluated prior to inductionOxygen Delivery Method: Circle system utilized Preoxygenation: Pre-oxygenation with 100% oxygen Intubation Type: IV induction and Cricoid Pressure applied Ventilation: Mask ventilation without difficulty Laryngoscope Size: Mac and 3 Grade View: Grade I Tube type: Oral Tube size: 7.5 mm Number of attempts: 1 Airway Equipment and Method: Stylet and LTA kit utilized Placement Confirmation: ETT inserted through vocal cords under direct vision and breath sounds checked- equal and bilateral Secured at: 21 cm Tube secured with: Tape Dental Injury: Teeth and Oropharynx as per pre-operative assessment

## 2011-05-23 LAB — CBC
Hemoglobin: 9.9 g/dL — ABNORMAL LOW (ref 12.0–15.0)
Platelets: 251 10*3/uL (ref 150–400)
RBC: 3.92 MIL/uL (ref 3.87–5.11)

## 2011-05-23 MED ORDER — HYDROCODONE-ACETAMINOPHEN 5-325 MG PO TABS: 1.0000 | ORAL_TABLET | ORAL | Status: DC | PRN

## 2011-05-23 NOTE — Progress Notes (Signed)
1 Day Post-Op  Subjective: Pt ok. Sore. Voiding well No N/V, tol reg diet  Objective: Vital signs in last 24 hours: Temp:  [97.5 F (36.4 C)-98.7 F (37.1 C)] 98 F (36.7 C) (03/02 0549) Pulse Rate:  [63-88] 63  (03/02 0549) Resp:  [14-22] 18  (03/02 0549) BP: (91-127)/(46-68) 94/49 mmHg (03/02 0549) SpO2:  [96 %-100 %] 99 % (03/02 0549) Weight:  [60.37 kg (133 lb 1.5 oz)] 60.37 kg (133 lb 1.5 oz) (03/02 0213)    Intake/Output this shift:    Physical Exam: BP 94/49  Pulse 63  Temp(Src) 98 F (36.7 C) (Oral)  Resp 18  Ht 5\' 4"  (1.626 m)  Wt 60.37 kg (133 lb 1.5 oz)  BMI 22.85 kg/m2  SpO2 99%  LMP 05/08/2011 Lungs: CTA without w/r/r Heart: Regular Abdomen: soft, ND, appropriately tender   Incisions all c/d/i without erythema or hematoma. Ext: No edema or tenderness   Labs: CBC  Basename 05/23/11 0630 05/22/11 0916  WBC 8.9 14.9*  HGB 9.9* 11.3*  HCT 30.5* 34.4*  PLT 251 299   BMET  Basename 05/22/11 0916  NA 139  K 3.8  CL 107  CO2 24  GLUCOSE 114*  BUN 12  CREATININE 0.88  CALCIUM 8.8   LFT  Basename 05/22/11 0916  PROT 7.5  ALBUMIN 3.4*  AST 12  ALT 7  ALKPHOS 52  BILITOT 0.8  BILIDIR --  IBILI --  LIPASE 14   PT/INR No results found for this basename: LABPROT:2,INR:2 in the last 72 hours ABG No results found for this basename: PHART:2,PCO2:2,PO2:2,HCO3:2 in the last 72 hours  Studies/Results: Ct Abdomen Pelvis W Contrast  05/22/2011  *RADIOLOGY REPORT*  Clinical Data: Right lower quadrant pain, question appendicitis.  CT ABDOMEN AND PELVIS WITH CONTRAST  Technique:  Multidetector CT imaging of the abdomen and pelvis was performed following the standard protocol during bolus administration of intravenous contrast.  Contrast: 80mL OMNIPAQUE IOHEXOL 300 MG/ML IJ SOLN  Comparison:  01/31/2011  Findings: Lung bases are clear.  No effusions.  Heart is normal size.  Prior cholecystectomy.  No focal lesion in the liver.  The appendix is  visualized.  The mid to distal portion of the appendix is dilated with surrounding inflammatory change compatible with acute appendicitis.  Distal appendix measures up to 12 mm.  Spleen, pancreas, adrenals and kidneys are normal.  Trace free fluid in the pelvis.  Uterus and adnexa as well as urinary bladder grossly unremarkable.  No acute bony abnormality  IMPRESSION: Mid to distal appendix is dilated with surrounding inflammatory change compatible with acute appendicitis.  Original Report Authenticated By: Cyndie Chime, M.D.    Assessment: Principal Problem:  *Appendicitis   Procedure(s): APPENDECTOMY LAPAROSCOPIC  Plan: DC home  LOS: 1 day    Marianna Fuss PA-C 05/23/2011 9:05 AM

## 2011-05-23 NOTE — Discharge Summary (Signed)
Physician Discharge Summary  Patient ID: Paula Villegas MRN: [id number] DOB/AGE: [birth year] 35 y.o.  Admit date: 05/22/2011 Discharge date: 05/23/2011  Admission Diagnoses: Appendicitis Discharge Diagnoses:  Principal Problem:  *Appendicitis    Procedures: Procedure(s): APPENDECTOMY LAPAROSCOPIC  Discharged Condition: good  Hospital Course: HPI: Paula Villegas is an 36 y.o. female who began having abd pain yesterday morning. It was mostly in the mid low abdomen. She had a BM but no better. She has been nauseated, but not vomited. She denies fever, chills.  The pain worsened and focalized to the (R)LQ and she came to the ER this morning.  Workup finds evidence of appendicitis on CT and labs.  Surgery eval requested.  She was admitted and taken to the OR on 3/1. She underwent uncomplicated lap appy. No intra-op or post-op issues or complications. She is stable for discharge on POD#1  Consults: None   Discharge Exam: Blood pressure 94/49, pulse 63, temperature 98 F (36.7 C), temperature source Oral, resp. rate 18, height 5\' 4"  (1.626 m), weight 60.37 kg (133 lb 1.5 oz), last menstrual period 05/08/2011, SpO2 99.00%. Lungs: CTA without w/r/r Heart: Regular Abdomen: soft, ND, appropriately tender   Incisions all c/d/i without erythema or hematoma. Ext: No edema or tenderness   Disposition: 01-Home or Self Care  Discharge Orders    Future Orders Please Complete By Expires   Diet general      Increase activity slowly      May shower / Bathe      No dressing needed      Call MD for:  severe uncontrolled pain      Call MD for:  persistant nausea and vomiting      Call MD for:  temperature >100.4      Call MD for:  redness, tenderness, or signs of infection (pain, swelling, redness, odor or green/yellow discharge around incision site)        Medication List  As of 05/23/2011  9:08 AM   TAKE these medications         HYDROcodone-acetaminophen 5-325 MG per tablet   Commonly known as: NORCO   Take 1-2 tablets by mouth every 4 (four) hours as needed.           Follow-up Information    Follow up with CCS,MD, MD on 06/02/2011. (DOW clinic 2:50pm)    Contact information:   [facility] [address] [place] [address] [phone]          Signed: Alyse Low 05/23/2011, 9:08 AM

## 2011-05-23 NOTE — Progress Notes (Signed)
[facility] ([facility]) - ATTENDING: Agree. Velora Heckler, MD, [facility] Office: [phone]

## 2011-05-23 NOTE — Discharge Instructions (Signed)
[facility] ______[facility] °LAPAROSCOPIC SURGERY: POST OP INSTRUCTIONS °Always review your discharge instruction sheet given to you by the facility where your surgery was performed. °IF YOU HAVE DISABILITY OR FAMILY LEAVE FORMS, YOU MUST BRING THEM TO THE OFFICE FOR PROCESSING.   °DO NOT GIVE THEM TO YOUR DOCTOR. ° °1. A prescription for pain medication may be given to you upon discharge.  Take your pain medication as prescribed, if needed.  If narcotic pain medicine is not needed, then you may take acetaminophen (Tylenol) or ibuprofen (Advil) as needed. °2. Take your usually prescribed medications unless otherwise directed. °3. If you need a refill on your pain medication, please contact your pharmacy.  They will contact our office to request authorization. Prescriptions will not be filled after 5pm or on week-ends. °4. You should follow a light diet the first few days after arrival home, such as soup and crackers, etc.  Be sure to include lots of fluids daily. °5. Most patients will experience some swelling and bruising in the area of the incisions.  Ice packs will help.  Swelling and bruising can take several days to resolve.  °6. It is common to experience some constipation if taking pain medication after surgery.  Increasing fluid intake and taking a stool softener (such as Colace) will usually help or prevent this problem from occurring.  A mild laxative (Milk of Magnesia or Miralax) should be taken according to package instructions if there are no bowel movements after 48 hours. °7. Unless discharge instructions indicate otherwise, you may remove your bandages 24-48 hours after surgery, and you may shower at that time.  You may have steri-strips (small skin tapes) in place directly over the incision.  These strips should be left on the skin for 7-10 days.  If your surgeon used skin glue on the incision, you may shower in 24 hours.  The glue will flake off over the next 2-3 weeks.  Any sutures or  staples will be removed at the office during your follow-up visit. °8. ACTIVITIES:  You may resume regular (light) daily activities beginning the next day--such as daily self-care, walking, climbing stairs--gradually increasing activities as tolerated.  You may have sexual intercourse when it is comfortable.  Refrain from any heavy lifting or straining until approved by your doctor. °a. You may drive when you are no longer taking prescription pain medication, you can comfortably wear a seatbelt, and you can safely maneuver your car and apply brakes. °b. RETURN TO WORK:  __________________________________________________________ °9. You should see your doctor in the office for a follow-up appointment approximately 2-3 weeks after your surgery.  Make sure that you call for this appointment within a day or two after you arrive home to insure a convenient appointment time. °10. OTHER INSTRUCTIONS: __________________________________________________________________________________________________________________________ __________________________________________________________________________________________________________________________ °WHEN TO CALL YOUR DOCTOR: °1. Fever over 101.0 °2. Inability to urinate °3. Continued bleeding from incision. °4. Increased pain, redness, or drainage from the incision. °5. Increasing abdominal pain ° °The clinic staff is available to answer your questions during regular business hours.  Please don’t hesitate to call and ask to speak to one of the nurses for clinical concerns.  If you have a medical emergency, go to the nearest emergency room or call 911.  A surgeon from [facility] is always on call at the hospital. °[address], [address], [place], [place]  [address] ? [address] [address], [place], [place]   [address] °[phone] ? [phone] ? FAX [phone] °Web site:   [email or web address] °

## 2011-05-26 ENCOUNTER — Encounter (HOSPITAL_COMMUNITY): Payer: Self-pay | Admitting: Surgery

## 2011-06-02 ENCOUNTER — Ambulatory Visit (INDEPENDENT_AMBULATORY_CARE_PROVIDER_SITE_OTHER): Payer: Self-pay | Admitting: General Surgery

## 2011-06-02 ENCOUNTER — Encounter (INDEPENDENT_AMBULATORY_CARE_PROVIDER_SITE_OTHER): Payer: Self-pay | Admitting: General Surgery

## 2011-06-02 VITALS — BP 120/74 | HR 68 | Temp 97.8°F | Resp 18 | Ht 62.75 in | Wt 131.0 lb

## 2011-06-02 DIAGNOSIS — Z9889 Other specified postprocedural states: Secondary | ICD-10-CM

## 2011-06-02 DIAGNOSIS — K3580 Unspecified acute appendicitis: Secondary | ICD-10-CM

## 2011-06-02 NOTE — Progress Notes (Signed)
Paula Villegas 11-25-1975 [id number] 06/02/2011   History of Present Illness: Paula Villegas is a  36 y.o. female who presents today status post lap appy by Dr. Gerrit Friends.  Pathology reveals acute suppurative appendicitis with necrosis and serositis.  The patient is tolerating a regular diet, having normal bowel movements, has good pain control.  She  is back to most normal activities.   Physical Exam: Abd: soft, nontender, active bowel sounds, nondistended.  All incisions are well healed.  Impression: 1.  Acute appendicitis, s/p lap appy  Plan: She  is able to return to normal activities. She  may follow up on a prn basis.

## 2011-06-02 NOTE — Patient Instructions (Signed)
Follow up as needed

## 2012-03-23 DIAGNOSIS — N83209 Unspecified ovarian cyst, unspecified side: Secondary | ICD-10-CM

## 2012-03-23 HISTORY — DX: Unspecified ovarian cyst, unspecified side: N83.209

## 2012-03-23 HISTORY — PX: APPENDECTOMY: SHX54

## 2012-10-04 LAB — OB RESULTS CONSOLE RUBELLA ANTIBODY, IGM: Rubella: IMMUNE

## 2012-10-05 ENCOUNTER — Encounter (HOSPITAL_COMMUNITY): Payer: Self-pay

## 2012-10-05 ENCOUNTER — Inpatient Hospital Stay (HOSPITAL_COMMUNITY): Payer: Self-pay

## 2012-10-05 ENCOUNTER — Inpatient Hospital Stay (HOSPITAL_COMMUNITY)
Admission: AD | Admit: 2012-10-05 | Discharge: 2012-10-05 | Disposition: A | Discharge status: Home / Self Care | Payer: Self-pay | Source: Ambulatory Visit | Attending: Obstetrics & Gynecology | Admitting: Obstetrics & Gynecology

## 2012-10-05 DIAGNOSIS — O2390 Unspecified genitourinary tract infection in pregnancy, unspecified trimester: Secondary | ICD-10-CM | POA: Insufficient documentation

## 2012-10-05 DIAGNOSIS — O209 Hemorrhage in early pregnancy, unspecified: Secondary | ICD-10-CM | POA: Insufficient documentation

## 2012-10-05 DIAGNOSIS — B9689 Other specified bacterial agents as the cause of diseases classified elsewhere: Secondary | ICD-10-CM | POA: Insufficient documentation

## 2012-10-05 DIAGNOSIS — N949 Unspecified condition associated with female genital organs and menstrual cycle: Secondary | ICD-10-CM | POA: Insufficient documentation

## 2012-10-05 DIAGNOSIS — A499 Bacterial infection, unspecified: Secondary | ICD-10-CM | POA: Insufficient documentation

## 2012-10-05 DIAGNOSIS — N760 Acute vaginitis: Secondary | ICD-10-CM | POA: Insufficient documentation

## 2012-10-05 LAB — WET PREP, GENITAL
Trich, Wet Prep: NONE SEEN
Yeast Wet Prep HPF POC: NONE SEEN

## 2012-10-05 LAB — URINALYSIS, ROUTINE W REFLEX MICROSCOPIC
Bilirubin Urine: NEGATIVE
Nitrite: NEGATIVE
Protein, ur: NEGATIVE mg/dL
Specific Gravity, Urine: 1.02 (ref 1.005–1.030)
Urobilinogen, UA: 0.2 mg/dL (ref 0.0–1.0)

## 2012-10-05 LAB — URINE MICROSCOPIC-ADD ON

## 2012-10-05 LAB — CBC
HCT: 33.1 % — ABNORMAL LOW (ref 36.0–46.0)
MCH: 24.6 pg — ABNORMAL LOW (ref 26.0–34.0)
MCHC: 32.9 g/dL (ref 30.0–36.0)
MCV: 74.7 fL — ABNORMAL LOW (ref 78.0–100.0)
Platelets: 280 10*3/uL (ref 150–400)
RDW: 17.9 % — ABNORMAL HIGH (ref 11.5–15.5)
WBC: 7.3 10*3/uL (ref 4.0–10.5)

## 2012-10-05 MED ORDER — METRONIDAZOLE 500 MG PO TABS: 500.0000 mg | ORAL_TABLET | Freq: Two times a day (BID) | ORAL | Status: DC

## 2012-10-05 NOTE — MAU Note (Signed)
Patient is in with c/o spotting since last night. She denies recent intercourse or pain.

## 2012-10-05 NOTE — MAU Note (Signed)
Patient states she had a positive pregnancy test x 3 at home. States she started spotting bright red last night and a little more than spotting this am. Denies pain.

## 2012-10-05 NOTE — MAU Provider Note (Signed)
History     CSN: [id number]  Arrival date and time: 10/05/12 0865   First Provider Initiated Contact with Patient 10/05/12 1028      Chief Complaint  Patient presents with  . Possible Pregnancy  . Vaginal Bleeding   HPI  Pt is here with report of vaginal bleeding and "little" pelvic pain.  Patient's last menstrual period was 08/06/2012.  Uncertain with dates.  Reports small amount of bleeding in June x 1.  Spotting of blood last night, but the bleeding has picked up today.     Past Medical History  Diagnosis Date  . No pertinent past medical history     Past Surgical History  Procedure Laterality Date  . Cholecystectomy    . Laparoscopic appendectomy  05/22/2011    Procedure: APPENDECTOMY LAPAROSCOPIC;  Surgeon: Velora Heckler, MD;  Location: [facility] OR;  Service: General;  Laterality: N/A;    Family History  Problem Relation Age of Onset  . Hypertension Mother     History  Substance Use Topics  . Smoking status: Never Smoker   . Smokeless tobacco: Never Used  . Alcohol Use: No    Allergies: No Known Allergies  No prescriptions prior to admission    Review of Systems  Gastrointestinal: Positive for abdominal pain (cramping).  Genitourinary:       Vaginal bleeding  All other systems reviewed and are negative.   Physical Exam   Blood pressure 117/69, pulse 62, temperature 99.2 F (37.3 C), temperature source Oral, resp. rate 16, height 5\' 3"  (1.6 m), weight 64.411 kg (142 lb), last menstrual period 08/06/2012, SpO2 100.00%.  Physical Exam  Constitutional: She is oriented to person, place, and time. She appears well-developed and well-nourished. No distress.  HENT:  Head: Normocephalic.  Neck: Normal range of motion. Neck supple.  Cardiovascular: Normal rate, regular rhythm and normal heart sounds.   Respiratory: Effort normal and breath sounds normal. No respiratory distress.  GI: Soft. There is no tenderness.  Genitourinary: Uterus is enlarged. Right  adnexum displays no mass, no tenderness and no fullness. Left adnexum displays no mass, no tenderness and no fullness. There is bleeding (blood tinged mucus) around the vagina.  Musculoskeletal: Normal range of motion.  Neurological: She is alert and oriented to person, place, and time.  Skin: Skin is warm and dry.    MAU Course  Procedures  Results for orders placed during the hospital encounter of 10/05/12 (from the past 24 hour(s))  URINALYSIS, ROUTINE W REFLEX MICROSCOPIC     Status: Abnormal   Collection Time    10/05/12 10:10 AM      Result Value Range   Color, Urine YELLOW  YELLOW   APPearance CLEAR  CLEAR   Specific Gravity, Urine 1.020  1.005 - 1.030   pH 6.5  5.0 - 8.0   Glucose, UA NEGATIVE  NEGATIVE mg/dL   Hgb urine dipstick MODERATE (*) NEGATIVE   Bilirubin Urine NEGATIVE  NEGATIVE   Ketones, ur NEGATIVE  NEGATIVE mg/dL   Protein, ur NEGATIVE  NEGATIVE mg/dL   Urobilinogen, UA 0.2  0.0 - 1.0 mg/dL   Nitrite NEGATIVE  NEGATIVE   Leukocytes, UA TRACE (*) NEGATIVE  URINE MICROSCOPIC-ADD ON     Status: Abnormal   Collection Time    10/05/12 10:10 AM      Result Value Range   Squamous Epithelial / LPF FEW (*) RARE   WBC, UA 0-2  <3 WBC/hpf   RBC / HPF 0-2  <3 RBC/hpf  Urine-Other MUCOUS PRESENT    POCT PREGNANCY, URINE     Status: Abnormal   Collection Time    10/05/12 10:14 AM      Result Value Range   Preg Test, Ur POSITIVE (*) NEGATIVE  WET PREP, GENITAL     Status: Abnormal   Collection Time    10/05/12 10:50 AM      Result Value Range   Yeast Wet Prep HPF POC NONE SEEN  NONE SEEN   Trich, Wet Prep NONE SEEN  NONE SEEN   Clue Cells Wet Prep HPF POC MANY (*) NONE SEEN   WBC, Wet Prep HPF POC MANY (*) NONE SEEN  CBC     Status: Abnormal   Collection Time    10/05/12 11:09 AM      Result Value Range   WBC 7.3  4.0 - 10.5 K/uL   RBC 4.43  3.87 - 5.11 MIL/uL   Hemoglobin 10.9 (*) 12.0 - 15.0 g/dL   HCT 16.1 (*) 09.6 - 04.5 %   MCV 74.7 (*) 78.0 - 100.0  fL   MCH 24.6 (*) 26.0 - 34.0 pg   MCHC 32.9  30.0 - 36.0 g/dL   RDW 40.9 (*) 81.1 - 91.4 %   Platelets 280  150 - 400 K/uL  ABO/RH     Status: None   Collection Time    10/05/12 11:09 AM      Result Value Range   ABO/RH(D) O POS    HCG, QUANTITATIVE, PREGNANCY     Status: Abnormal   Collection Time    10/05/12 11:10 AM      Result Value Range   hCG, Beta Chain, Quant, S [id number] (*) <5 mIU/mL   Ultrasound:  Assessment and Plan  Bleeding in Early Pregnancy Bacterial Vaginosis  Plan: Discharge to home RX Flagyl Bleeding precautions given Begin prenatal care  [facility] 10/05/2012, 10:42 AM

## 2012-10-06 LAB — GC/CHLAMYDIA PROBE AMP: CT Probe RNA: NEGATIVE

## 2012-11-10 LAB — CULTURE, OB URINE: Urine Culture, OB: NEGATIVE

## 2012-11-10 LAB — OB RESULTS CONSOLE HIV ANTIBODY (ROUTINE TESTING): HIV: NONREACTIVE

## 2012-11-10 LAB — CYTOLOGY - PAP: CYTOLOGY - PAP: NEGATIVE

## 2012-11-10 LAB — OB RESULTS CONSOLE VARICELLA ZOSTER ANTIBODY, IGG: Varicella: IMMUNE

## 2012-11-10 LAB — OB RESULTS CONSOLE GC/CHLAMYDIA
CHLAMYDIA, DNA PROBE: NEGATIVE
GC PROBE AMP, GENITAL: NEGATIVE

## 2012-11-10 LAB — GLUCOSE TOLERANCE, 1 HOUR: Glucose, 1 Hour GTT: 76

## 2013-01-02 ENCOUNTER — Other Ambulatory Visit (HOSPITAL_COMMUNITY): Payer: Self-pay | Admitting: Nurse Practitioner

## 2013-01-02 DIAGNOSIS — Z3689 Encounter for other specified antenatal screening: Secondary | ICD-10-CM

## 2013-01-06 ENCOUNTER — Ambulatory Visit (HOSPITAL_COMMUNITY)
Admission: RE | Admit: 2013-01-06 | Discharge: 2013-01-06 | Disposition: A | Discharge status: Home / Self Care | Payer: Self-pay | Source: Ambulatory Visit | Attending: Nurse Practitioner | Admitting: Nurse Practitioner

## 2013-01-06 ENCOUNTER — Ambulatory Visit (HOSPITAL_COMMUNITY): Admission: RE | Admit: 2013-01-06 | Payer: Self-pay | Source: Ambulatory Visit

## 2013-01-06 DIAGNOSIS — Z3689 Encounter for other specified antenatal screening: Secondary | ICD-10-CM

## 2013-02-02 ENCOUNTER — Other Ambulatory Visit (HOSPITAL_COMMUNITY): Payer: Self-pay | Admitting: Nurse Practitioner

## 2013-02-02 DIAGNOSIS — O09529 Supervision of elderly multigravida, unspecified trimester: Secondary | ICD-10-CM

## 2013-02-10 ENCOUNTER — Ambulatory Visit (HOSPITAL_COMMUNITY)
Admission: RE | Admit: 2013-02-10 | Discharge: 2013-02-10 | Disposition: A | Discharge status: Home / Self Care | Payer: Self-pay | Source: Ambulatory Visit | Attending: Nurse Practitioner | Admitting: Nurse Practitioner

## 2013-02-10 DIAGNOSIS — Z3689 Encounter for other specified antenatal screening: Secondary | ICD-10-CM | POA: Insufficient documentation

## 2013-02-10 DIAGNOSIS — O09529 Supervision of elderly multigravida, unspecified trimester: Secondary | ICD-10-CM | POA: Insufficient documentation

## 2013-02-10 NOTE — Progress Notes (Signed)
Paula Villegas  was seen today for an ultrasound appointment.  See full report in [facility].  Impression: Single IUP a 24 5/7 weeks Advanced maternal age Normal interval anatomy - the fetal anatomic survey is now complete Fetal growth is appropriate (73rd %tile) Fundal placenta without previa - the previously noted succenturate lobe is not appreciated on today's study Normal amniotic fluid volume  Recommendations: Follow-up ultrasounds as clinically indicated.   Alpha Gula, MD

## 2013-03-13 LAB — OB RESULTS CONSOLE HGB/HCT, BLOOD
HCT: 38 %
HEMOGLOBIN: 12.6 g/dL

## 2013-03-13 LAB — OB RESULTS CONSOLE RPR: RPR: NONREACTIVE

## 2013-03-22 LAB — GLUCOSE TOLERANCE, 3 HOURS
Glucose, 1 Hour GTT: 146
Glucose, Fasting: 71 mg/dL (ref 60–109)

## 2013-03-23 HISTORY — PX: INTRAUTERINE DEVICE INSERTION: SHX323

## 2013-03-23 NOTE — L&D Delivery Note (Signed)
Delivery Note At 2:53 PM a viable female was delivered via Vaginal, Spontaneous Delivery (Presentation: Left Occiput Anterior).  APGAR: 9, 9; weight .   Placenta status: Intact, Spontaneous.  Cord: 3 vessels with the following complications: None.  Cord pH: NA  Anesthesia: None  Episiotomy: None Lacerations: 2nd degree;Perineal Suture Repair: 3.0 monocryl on CT Est. Blood Loss (mL): 300  Mom to postpartum.  Baby to Couplet care / Skin to Skin.  NSVD over 2nd degree tear. Viable female to maternal abodmen. Active management of 3rd stage with pit and traction delivered intact placenta w/ 3v cord. Tear repaired in usual fasion w/ 3.0 monocryl. EBL 300. hemostatis and counts correct.  Paula Villegas, Paula Villegas 05/18/2013, 3:16 PM

## 2013-03-27 ENCOUNTER — Inpatient Hospital Stay (HOSPITAL_COMMUNITY)
Admission: AD | Admit: 2013-03-27 | Discharge: 2013-03-27 | Disposition: A | Discharge status: Home / Self Care | Payer: Self-pay | Source: Ambulatory Visit | Attending: Obstetrics & Gynecology | Admitting: Obstetrics & Gynecology

## 2013-03-27 ENCOUNTER — Encounter (HOSPITAL_COMMUNITY): Payer: Self-pay | Admitting: *Deleted

## 2013-03-27 DIAGNOSIS — B3731 Acute candidiasis of vulva and vagina: Secondary | ICD-10-CM

## 2013-03-27 DIAGNOSIS — O4700 False labor before 37 completed weeks of gestation, unspecified trimester: Secondary | ICD-10-CM | POA: Insufficient documentation

## 2013-03-27 DIAGNOSIS — B373 Candidiasis of vulva and vagina: Secondary | ICD-10-CM

## 2013-03-27 LAB — URINALYSIS, ROUTINE W REFLEX MICROSCOPIC
Bilirubin Urine: NEGATIVE
GLUCOSE, UA: NEGATIVE mg/dL
HGB URINE DIPSTICK: NEGATIVE
Ketones, ur: NEGATIVE mg/dL
Nitrite: NEGATIVE
PH: 6.5 (ref 5.0–8.0)
Protein, ur: NEGATIVE mg/dL
SPECIFIC GRAVITY, URINE: 1.02 (ref 1.005–1.030)
Urobilinogen, UA: 1 mg/dL (ref 0.0–1.0)

## 2013-03-27 LAB — URINE MICROSCOPIC-ADD ON

## 2013-03-27 LAB — OB RESULTS CONSOLE GC/CHLAMYDIA
Chlamydia: NEGATIVE
GC PROBE AMP, GENITAL: NEGATIVE

## 2013-03-27 MED ORDER — BETAMETHASONE SOD PHOS & ACET 6 (3-3) MG/ML IJ SUSP: 12.0000 mg | Freq: Once | INTRAMUSCULAR | Status: DC

## 2013-03-27 MED ORDER — NIFEDIPINE 10 MG PO CAPS
10.0000 mg | ORAL_CAPSULE | Freq: Three times a day (TID) | ORAL | Status: DC
  Administered 2013-03-27: 10 mg via ORAL
  Filled 2013-03-27: qty 1

## 2013-03-27 MED ORDER — FLUCONAZOLE 150 MG PO TABS: 150.0000 mg | ORAL_TABLET | Freq: Once | ORAL | Status: DC

## 2013-03-27 MED ORDER — NIFEDIPINE 10 MG PO CAPS: 10.0000 mg | ORAL_CAPSULE | Freq: Three times a day (TID) | ORAL | Status: DC

## 2013-03-27 MED ORDER — BETAMETHASONE SOD PHOS & ACET 6 (3-3) MG/ML IJ SUSP
12.0000 mg | INTRAMUSCULAR | Status: DC
  Administered 2013-03-27: 12 mg via INTRAMUSCULAR
  Filled 2013-03-27: qty 2

## 2013-03-27 NOTE — MAU Note (Signed)
abd pain and contractions, started last Thurs, comes and goes.

## 2013-03-27 NOTE — Discharge Instructions (Signed)
Informacin sobre Government social research officer  (Preterm Labor Information) El parto prematuro comienza antes de la semana 37 de [place]. La duracin de un embarazo normal es de 39 a 41 semanas.  CAUSAS  Generalmente no hay una causa que pueda identificarse del motivo por el que una mujer comienza un trabajo de parto prematuro. Sin embargo, una de las causas conocidas ms frecuentes son las infecciones. Las infecciones del tero, el cuello, la vagina, el lquido [place], la vejiga, los riones y Teacher, adult education de los pulmones (neumona) pueden hacer que el trabajo de parto se inicie. Otras causas que pueden sospecharse son:   Infecciones urogenitales, como infecciones por hongos y vaginosis bacteriana.   Anormalidades uterinas (forma del tero, sptum uterino, fibromas, hemorragias en la placenta).   Un cuello que ha sido operado (puede ser que no permanezca cerrado).   Malformaciones del feto.   Gestaciones mltiples (mellizos, trillizos y ms).   Ruptura del saco amnitico.  FACTORES DE RIESGO   Historia previa de parto prematuro.   Tener ruptura prematura de las membranas (RPM).   La placenta cubre la abertura del cuello (placenta previa).   La placenta se separa del tero (abrupcin placentaria).   El cuello es demasiado dbil para contener al beb en el tero (cuello incompetente).   Hay mucho lquido en el saco amnitico (polihidramnios).   Consumo de drogas o hbito de fumar durante Firefighter.   No aumentar de peso lo suficiente durante el [employer].   Mujeres menores de 18 aos o mayores de [address] [place].   Nivel socioeconmico bajo.   Pertenecer a Engineer, production. SNTOMAS  Los signos y sntomas del trabajo de parto prematuro son:   Public librarian similares a los Designer, jewellery, dolor abdominal o dolor de espalda.  Contracciones uterinas regulares, tan frecuentes como seis por hora, sin importar su intensidad (pueden ser suaves o dolorosas).  Contracciones que comienzan  en la parte superior del tero y se expanden hacia abajo, a la zona inferior del abdomen y la espalda.   Sensacin de aumento de presin en la pelvis.   Aparece una secrecin acuosa o sanguinolenta por la vagina.  TRATAMIENTO  Segn el tiempo del embarazo y otras [place], el mdico puede indicar reposo en cama. Si es necesario, le indicarn medicamentos para TEFL teacher las contracciones y para Customer service manager los pulmones del feto. Si el trabajo de parto se inicia antes de las 34 semanas de [place], se recomienda la hospitalizacin. El tratamiento depende de las condiciones en que se encuentren usted y el feto.  QU DEBE HACER SI PIENSA QUE EST EN TRABAJO DE PARTO PREMATURO?  Comunquese con su mdico inmediatamente. Debe concurrir al hospital para ser controlada inmediatamente.  CMO PUEDE EVITAR EL TRABAJO DE PARTO PREMATURO EN FUTUROS EMBARAZOS?  Usted debe:   Si fuma, abandonar el hbito.  Mantener un peso saludable y evitar sustancias qumicas y drogas innecesarias.  Controlar todo tipo de infeccin.  Informe a su mdico si tiene una historia conocida de trabajo de parto prematuro. Document Released: 06/16/2007 Document Revised: 11/09/2012 [facility] Patient Information 2014 [place], [place].  Infeccin por cndida en adultos (Candida Infection, Adult) Una infeccin por Cndida (tambin llamado infeccin por hongos levaduriformes, o infeccin por Monilia) es un crecimiento excesivo de hongos que puede ocurrir en cualquier parte del cuerpo. Una infeccin por cndida comnmente ocurre en las zonas ms calientes y hmedas del cuerpo. Usualmente, la infeccin permanece localizada pero puede diseminarse y convertirse en una infeccin sistmica. Una infeccin por cndida puede ser signo  de un trastorno ms grave como diabetes, leucemia, o SIDA. Una infeccin por cndida puede ocurrir tanto en hombres como en mujeres. En mujeres, la vaginitis Cndida es una infeccin vaginal. Se trata de una  de las causas ms frecuentes de la vaginitis. Los hombres no suelen tener sntomas [place] que se le aparecen otros problemas. Pueden descubrir que tienen una infeccin por hongos porque su compaera sexual los tiene. Es ms probable que los hombres no circuncisos adquieran una infeccin por hongo que aquellos que estn circuncindados. Esto se debe a que el glande no circuncidado no est expuesto al aire y no se mantiene tan seco como un glande circuncidado. Los ONEOKadultos mayores pueden desarrollar infecciones por cndida en la zona de la dentadura. CAUSAS Mujeres  Antibiticos.  Medicamento con esteroides Network engineerdurante mucho tiempo.  Tener sobrepeso (obesidad).  Diabetes.  Sistema inmune deficiente.  Ciertas enfermedades.  Medicamentos inmunosupresores para pacientes con trasplante de rganos.  Quimioterapia.  El Rhodhissembarazo.  Menstruacin.  Estrs o fatiga.  Consumo de drogas de forma intravenosa.  Anticonceptivos orales.  Utilizar ropa ajustada en la zona de la entrepierna.  Contagio a partir de un compaero sexual que ya tiene la infeccin.  Los espermicidas.  Catteres intravenosos, urinarios, u de otro tipo. Hombres  Contagio a partir de una compaera sexual que ya tiene la infeccin.  Tener sexo oral o anal con una persona que tiene la infeccin.  Los espermicidas.  Diabetes.  Antibiticos.  Sistema inmune deficiente.  Medicamentos que suprimen el sistema inmune.  El uso de drogas por va intravenosa.  Intravenosa, urinarias o catteres otros. SNTOMAS Mujeres  Flujo vaginal espeso y blanco.  Picazn vaginal.  Enrojecimiento e hinchazn en la zona de la vagina.  Irritacin de los labios de la vagina y perineo.  lceras en labios vaginales y perineo.  Relaciones sexuales dolorosas.  Bajo nivel de Bankerazcar en la sangre (hipoglucemia).  Dolor al Beatrix Shipperorinar.  Infecciones en la vejiga.  Problemas intestinales como constipacin, indigestin, mal aliento,  hinchazn, gases, diarrea o heces blandas. Hombres  Primero los hombres desarrollan problemas intestinales como constipacin, indigestin, mal aliento, hinchazn, gases, diarrea o heces blandas.  Piel del pene seca y Sudanquebradiza con picazn o molestias.  Prurito de Estate agentla ingle.  Piel seca y escamosa.  Pie de atleta.  Hipoglucemia. DIAGNSTICO Mujeres  Se revisa el historial y se realiza un anlisis.  La supuracin se observa con un microscopio.  Deber tomarse un cultivo de Administrator, artsla secrecin. Hombres  Se revisa el historial y se realiza un anlisis.  Se observar cualquier secrecin proveniente del pene y la piel quebrada en el microscopio y se Education officer, environmentalrealizar un cultivo.  Podrn realizarle cultivos de heces. TRATAMIENTO Mujeres  Supositorios y cremas antifngicos vaginales.  Cremas medicadas para disminuir la picazn e irritacin de la zona externa de la vagina.  Aplique una bolsa caliente en la zona del perineo para disminuir molestias o inflamacin.  Medicamentos antifngicos orales.  Supositorios vaginales o cremas medicinales, para infecciones repetidas o recurrentes.  Lave y seque la zona por completo antes de Comptrolleraplicar la crema.  La ingesta de yogur con lactobacillus le ayudar con la prevencin y Terminousel tratamiento.  Si las cremas y supositorios no funcionan, la aplicacin de solucin violeta de genciana puede ayudar. Hombres  Cremas anti hongos y medicamentos orales anti hongos.  A veces el tratamiento deber continuar por 30 das despus de que los sntomas desaparecen para prevenir la recurrencia. INSTRUCCIONES PARA EL CUIDADO DOMICILIARIO Mujeres  Utilice ropa interior de  algodn y evite las ropas ajustadas.  Evite el papel higinico de color o perfumado y los tampones o toallitas con desodorante.  No utilice duchas vaginales.  Mantenga la diabetes bajo control.  Tome todos los medicamentos tal como se le indic.  Mantenga su piel limpia y [place].  Consuma  leche o yogur con lactobacillus de [place] regular. Si contrae infecciones por levaduras frecuentes y cree saber cul es la infeccin, existen medicamentos de [employer]. Si la infeccin no parece curarse en 3 das, hable con el mdico.  Comunique a su compaero sexual que padece una infeccin por hongos. El tambin Network engineer, en especial si la infeccin no desaparece o es recurrente. Hombres  Mantenga su piel limpia y [place].  Mantenga la diabetes bajo control.  Tome todos los medicamentos tal como se le indic.  Comunique a su compaera sexual que sufre una infeccin por hongos. SOLICITE ANTENCIN MDICA SI:  Los sntomas no desaparecen o empeoran luego de 1 semana de tratamiento.  Usted tiene una temperatura oral de ms de 38,9 C (102 F).  Presenta dificultades para tragar o para comer durante un tiempo prolongado.  Aparecen ampollas en los genitales.  Si aparece una hemorragia vaginal y no es el momento del perodo.  Siente dolor abdominal.  Presentara problemas intestinales como los ya descritos.  Si se siente dbil o desfalleciente.  Siente dolor al Geographical information systems officer u observa una mayor cantidad [place].  Tiene dolor durante las [employer]. ASEGRESE DE QUE:  Comprende esas instrucciones para el alta mdica.  Controlar su enfermedad.  Pedir ayuda de inmediato si no mejora o empeora. Document Released: 03/09/2005 Document Revised: 06/01/2011 [facility] Patient Information 2014 [place], [place].

## 2013-03-27 NOTE — MAU Provider Note (Signed)
History     CSN: [id number]  Arrival date and time: 03/27/13 1313   First Provider Initiated Contact with Patient 03/27/13 1449      Chief Complaint  Patient presents with  . Labor Eval   HPI Pt is a 37yo [id number] at 31.1. She presents with 5 days of worsening contractions, as often as every 10 minutes but usually more like every 20-30 minutes. She has not had any bleeding or loss of fluid and is feeling normal fetal movement. [facility] for rouitine prenatal visit and her cervix was checked. At that time it was 1 cm dilated. Patient reports a prior pregnancy where she began having contractions at 32 weeks and was admitted for 4 days before giving birth.   OB History   Grav Para Term Preterm Abortions TAB SAB Ect Mult Living   5 3 2 1 1  1   3       Past Medical History  Diagnosis Date  . No pertinent past medical history   . Preterm labor     PTL x2, PTD x1    Past Surgical History  Procedure Laterality Date  . Cholecystectomy    . Laparoscopic appendectomy  05/22/2011    Procedure: APPENDECTOMY LAPAROSCOPIC;  Surgeon: Velora Hecklerodd M Gerkin, MD;  [facility];  Service: General;  Laterality: N/A;    Family History  Problem Relation Age of Onset  . Hypertension Mother   . Hearing loss Neg Hx     History  Substance Use Topics  . Smoking status: Never Smoker   . Smokeless tobacco: Never Used  . Alcohol Use: No    Allergies: No Known Allergies  Prescriptions prior to admission  Medication Sig Dispense Refill  . Prenatal Vit-Fe Fumarate-FA (PRENATAL MULTIVITAMIN) TABS tablet Take 1 tablet by mouth daily at 12 noon.      . ranitidine (ZANTAC) 150 MG tablet Take 150 mg by mouth at bedtime.        ROS No bleeding, LOF, endorses lower abdominal pain and vaginal itching Physical Exam   Blood pressure 108/73, pulse 88, temperature 98 F (36.7 C), temperature source Oral, resp. rate 16, height 5' 2.5" (1.588 m), weight 70.217 kg (154 lb 12.8  oz), last menstrual period 08/06/2012, SpO2 100.00%.  Physical Exam Gen: healthy woman, NAD, lying in bed Dilation: 1 Effacement (%): Thick Station: Ballotable Exam by:: Bacilio Abascal CNM  MAU Course  Procedures  MDM   Assessment and Plan  Given patient's history of preterm birth with idiopathic onset of labor at 32 weeks and not being treated with 17P, we will initiate BMZ treatment now. Patient to return at same time tomorrow for repeat injection. Will prescribe procardia to decrease contractions and consider repeat exam and fetal fibronectin when patient returns tomorrow.  Beverely Lowdamo, Elena 03/27/2013, 3:26 PM   . Evaluation and management procedures were performed by Resident physician under my supervision/collaboration. Chart reviewed, patient examined by me and I agree with management and plan. I consulted with Dr. Marice Potterove re POC and I did cx exam. FHR  Reactive and UI on EFM.  Results for orders placed during the hospital encounter of 03/27/13 (from the past 24 hour(s))  URINALYSIS, ROUTINE W REFLEX MICROSCOPIC     Status: Abnormal   Collection Time    03/27/13  1:45 PM      Result Value Range   Color, Urine YELLOW  YELLOW   APPearance HAZY (*) CLEAR   Specific Gravity, Urine 1.020  1.005 - 1.030   pH 6.5  5.0 - 8.0   Glucose, UA NEGATIVE  NEGATIVE mg/dL   Hgb urine dipstick NEGATIVE  NEGATIVE   Bilirubin Urine NEGATIVE  NEGATIVE   Ketones, ur NEGATIVE  NEGATIVE mg/dL   Protein, ur NEGATIVE  NEGATIVE mg/dL   Urobilinogen, UA 1.0  0.0 - 1.0 mg/dL   Nitrite NEGATIVE  NEGATIVE   Leukocytes, UA SMALL (*) NEGATIVE  URINE MICROSCOPIC-ADD ON     Status: Abnormal   Collection Time    03/27/13  1:45 PM      Result Value Range   Squamous Epithelial / LPF FEW (*) RARE   Crystals CA OXALATE CRYSTALS (*) NEGATIVE  Leianne Callins Colin Mulders, CNM 03/27/2013 5:50 PM

## 2013-03-27 NOTE — MAU Note (Signed)
Patient states she has been having contractions irregular since last week. States she [facility] and was 1 cm. Sent to MAU for evaluation. Denies bleeding or leaking and reports good fetal movement.

## 2013-03-28 ENCOUNTER — Inpatient Hospital Stay (HOSPITAL_COMMUNITY)
Admission: AD | Admit: 2013-03-28 | Discharge: 2013-03-28 | Disposition: A | Discharge status: Home / Self Care | Payer: Self-pay | Source: Ambulatory Visit | Attending: Obstetrics and Gynecology | Admitting: Obstetrics and Gynecology

## 2013-03-28 ENCOUNTER — Encounter (HOSPITAL_COMMUNITY): Payer: Self-pay | Admitting: *Deleted

## 2013-03-28 DIAGNOSIS — R109 Unspecified abdominal pain: Secondary | ICD-10-CM | POA: Insufficient documentation

## 2013-03-28 DIAGNOSIS — O368390 Maternal care for abnormalities of the fetal heart rate or rhythm, unspecified trimester, not applicable or unspecified: Secondary | ICD-10-CM | POA: Insufficient documentation

## 2013-03-28 DIAGNOSIS — O4700 False labor before 37 completed weeks of gestation, unspecified trimester: Secondary | ICD-10-CM | POA: Insufficient documentation

## 2013-03-28 DIAGNOSIS — O368190 Decreased fetal movements, unspecified trimester, not applicable or unspecified: Secondary | ICD-10-CM | POA: Insufficient documentation

## 2013-03-28 DIAGNOSIS — O368191 Decreased fetal movements, unspecified trimester, fetus 1: Secondary | ICD-10-CM

## 2013-03-28 MED ORDER — BETAMETHASONE SOD PHOS & ACET 6 (3-3) MG/ML IJ SUSP
12.0000 mg | Freq: Once | INTRAMUSCULAR | Status: AC
  Administered 2013-03-28: 12 mg via INTRAMUSCULAR
  Filled 2013-03-28: qty 2

## 2013-03-28 NOTE — Progress Notes (Signed)
Dr Ike Benedom on unit and aware of pt's admission and status. In to see pt

## 2013-03-28 NOTE — Progress Notes (Signed)
DR Ike Benedom discussed d/c plan with pt. Written and verbal d/c instructions given and understanding voiced

## 2013-03-28 NOTE — MAU Note (Signed)
Pt here for 2nd Betamethasone injection. States stomach is hurting and baby not moving like usual today.

## 2013-03-28 NOTE — MAU Provider Note (Signed)
First Provider Initiated Contact with Patient 03/28/13 1719      Chief Complaint:  Injections, Abdominal Pain and Decreased Fetal Movement   Paula Villegas is  38 y.o. [phone] at 1852w2d presents complaining of Injections, Abdominal Pain and Decreased Fetal Movement Pt states that she has been having occasional contractions but had significant decreased fetal movement this morning. Pt has had some movement this afternoon but not as much as normal.   Obstetrical/Gynecological History: OB History   Grav Para Term Preterm Abortions TAB SAB Ect Mult Living   5 3 2 1 1  1   3      Past Medical History: Past Medical History  Diagnosis Date  . No pertinent past medical history   . Preterm labor     PTL x2, PTD x1    Past Surgical History: Past Surgical History  Procedure Laterality Date  . Cholecystectomy    . Laparoscopic appendectomy  05/22/2011    Procedure: APPENDECTOMY LAPAROSCOPIC;  Surgeon: Velora Hecklerodd M Gerkin, MD;  [facility];  Service: General;  Laterality: N/A;    Family History: Family History  Problem Relation Age of Onset  . Hypertension Mother   . Hearing loss Neg Hx     Social History: History  Substance Use Topics  . Smoking status: Never Smoker   . Smokeless tobacco: Never Used  . Alcohol Use: No    Allergies: No Known Allergies  Meds:  Prescriptions prior to admission  Medication Sig Dispense Refill  . fluconazole (DIFLUCAN) 150 MG tablet Take 1 tablet (150 mg total) by mouth once.  2 tablet  0  . NIFEdipine (PROCARDIA) 10 MG capsule Take 1 capsule (10 mg total) by mouth every 8 (eight) hours.  30 capsule  0  . Prenatal Vit-Fe Fumarate-FA (PRENATAL MULTIVITAMIN) TABS tablet Take 1 tablet by mouth daily at 12 noon.      . ranitidine (ZANTAC) 150 MG tablet Take 150 mg by mouth at bedtime.        Review of Systems -   Review of Systems  No f/c, sob, n/v, d/c, urinary symptoms  No LOF, no VB, +ctx occasional Dec fetal movement    Physical Exam   Blood pressure 115/64, pulse 66, temperature 98.3 F (36.8 C), resp. rate 18, last menstrual period 08/06/2012. GENERAL: Well-developed, well-nourished female in no acute distress.     FHT:  Baseline rate 120s bpm   Variability moderate  Accelerations present   Decelerations one early Contractions: Every 10+ mins  Limited 3rd trim US performed by Dr. Birdena Jubileedom SIUP, FHT 145, Vtx, post/fund placenta, AFI 13.1   Labs: No results found for this or any previous visit (from the past 24 hour(s)). Imaging Studies:  No results found.  Assessment: Paula Villegas is  38 y.o. [phone] at 5152w2d presents with dec fetal movement and receiving her steroid injection.  Reassuring and reactive NST, normal AFI, reassured. Discharged home to follow up on Friday in clinic.   Shaleigh Laubscher RYAN 1/6/20155:31 PM

## 2013-03-28 NOTE — MAU Note (Signed)
I haven't felt the baby move much today. More white d/c today than yest and did see pink d/c x 1. My stomach is hurting around the middle section. Pain is constant and cramping

## 2013-03-29 ENCOUNTER — Inpatient Hospital Stay (HOSPITAL_COMMUNITY)
Admission: AD | Admit: 2013-03-29 | Discharge: 2013-03-29 | Disposition: A | Discharge status: Home / Self Care | Payer: Self-pay | Source: Ambulatory Visit | Attending: Obstetrics and Gynecology | Admitting: Obstetrics and Gynecology

## 2013-03-29 ENCOUNTER — Encounter (HOSPITAL_COMMUNITY): Payer: Self-pay

## 2013-03-29 DIAGNOSIS — O4700 False labor before 37 completed weeks of gestation, unspecified trimester: Secondary | ICD-10-CM | POA: Insufficient documentation

## 2013-03-29 DIAGNOSIS — O368190 Decreased fetal movements, unspecified trimester, not applicable or unspecified: Secondary | ICD-10-CM | POA: Insufficient documentation

## 2013-03-29 DIAGNOSIS — O368131 Decreased fetal movements, third trimester, fetus 1: Secondary | ICD-10-CM

## 2013-03-29 NOTE — MAU Provider Note (Signed)
Attestation of Attending Supervision of Advanced Practitioner (CNM/NP): Evaluation and management procedures were performed by the Advanced Practitioner under my supervision and collaboration.  I have reviewed the Advanced Practitioner's note and chart, and I agree with the management and plan.  Remigio Mcmillon 03/29/2013 7:32 AM   

## 2013-03-29 NOTE — MAU Note (Signed)
Only felt baby once today at 4pm. Decreased fetal movement since Sunday. Contractions 4x per hour since 7pm. Denies LOF or VB. Denies fever or cough, but does have a sore throat.

## 2013-03-29 NOTE — MAU Note (Signed)
Chief Complaint:  Decreased Fetal Movement  Contact first initiated at 9:01 PM     HPI: Paula Villegas is a 38 y.o. [id number] at 9135w3d who presents to maternity admissions reporting decreased fetal movement.  Pt states since she was started on Procardia two days ago, she feels the baby has not been moving as much as previous.  She was seen in the MAU yesterday for similar complaint, had bedside US performed by Dr. Ike Benedom ( SIUP, FHT 145, Vtx, post/fund placenta, AFI 13.1) which was reassuring, had a reassuring strip and was sent home with f/u Friday in clinic.  Pt since that time states she has only felt the baby move 3-4x's despite having changed position.  Denies regular contractions and denies any abdominal pain, vaginal d/c or bleeding, HA, edema.   Pregnancy Course: Uncomplicated   Past Medical History: Past Medical History  Diagnosis Date  . No pertinent past medical history   . Preterm labor     PTL x2, PTD x1    Past obstetric history: OB History  Gravida Para Term Preterm AB SAB TAB Ectopic Multiple Living  5 3 2 1 1 1    3     # Outcome Date GA Lbr Len/2nd Weight Sex Delivery Anes PTL Lv  5 CUR           4 SAB 2006          3 TRM 2003     SVD     2 PRE 1998 7529w0d    SVD   Y  1 TRM 1994     SVD         Past Surgical History: Past Surgical History  Procedure Laterality Date  . Cholecystectomy    . Laparoscopic appendectomy  05/22/2011    Procedure: APPENDECTOMY LAPAROSCOPIC;  Surgeon: Velora Hecklerodd M Gerkin, MD;  [facility];  Service: General;  Laterality: N/A;  . Appendectomy       Family History: Family History  Problem Relation Age of Onset  . Hypertension Mother   . Hearing loss Neg Hx     Social History: History  Substance Use Topics  . Smoking status: Never Smoker   . Smokeless tobacco: Never Used  . Alcohol Use: No    Allergies: No Known Allergies  Meds:  Prescriptions prior to admission  Medication Sig Dispense Refill  . NIFEdipine (PROCARDIA) 10  MG capsule Take 1 capsule (10 mg total) by mouth every 8 (eight) hours.  30 capsule  0  . Prenatal Vit-Fe Fumarate-FA (PRENATAL MULTIVITAMIN) TABS tablet Take 1 tablet by mouth daily at 12 noon.      . ranitidine (ZANTAC) 150 MG tablet Take 150 mg by mouth 2 (two) times daily as needed for heartburn.         ROS: Pertinent findings in history of present illness.  Physical Exam  Temperature 99 F (37.2 C), temperature source Oral, resp. rate 18, height 5\' 2"  (1.575 m), weight 69.763 kg (153 lb 12.8 oz), last menstrual period 08/06/2012. GENERAL: Well-developed, well-nourished female in no acute distress.  HEENT: normocephalic HEART: normal rate RESP: normal effort ABDOMEN: Soft, non-tender, gravid appropriate for gestational age EXTREMITIES: Nontender, no edema NEURO: alert and oriented    FHT:  Baseline 150 , moderate variability, accelerations present, no decelerations Contractions: q 10-15 mins, irregular    Labs: No results found for this or any previous visit (from the past 24 hour(s)).  Imaging:  No results found. MAU Course:   Assessment: 1.  Decreased fetal movement, third trimester, fetus 1     Plan: Reassuring/Reactive NST and exam Discharge home Labor precautions and fetal kick counts explained at length with pt and her brother today.  Close f/u with her OB or return to MAU if continued decreased fetal movement.     Medication List    ASK your doctor about these medications       NIFEdipine 10 MG capsule  Commonly known as:  PROCARDIA  Take 1 capsule (10 mg total) by mouth every 8 (eight) hours.     prenatal multivitamin Tabs tablet  Take 1 tablet by mouth daily at 12 noon.     ranitidine 150 MG tablet  Commonly known as:  ZANTAC  Take 150 mg by mouth 2 (two) times daily as needed for heartburn.        Briscoe Deutscher, DO 03/29/2013 9:05 PM  I have seen and examined this patient and I agree with the above. Cam Hai 10:25  PM 03/29/2013

## 2013-03-29 NOTE — Discharge Instructions (Signed)
Evaluacin de los movimientos fetales  (Fetal Movement Counts) Nombre del paciente: __________________________________________________ Paula ChapmanFecha de parto estimada: ____________________ Paula HammanLa evaluacin de los movimientos fetales es muy recomendable en los embarazos de alto riesgo, pero tambin es una buena idea que lo hagan todas las Paula Villegas. El Paula Villegas le indicar que comience a contarlos a las 28 semanas de Paula Villegas. Los movimientos fetales suelen aumentar:   Despus de Animatoruna comida completa.  Despus de la actividad fsica.  Despus de comer o beber Paula Villegas dulce o fro.  En reposo. Preste atencin cuando sienta que el beb est ms activo. Esto le ayudar a notar un patrn de ciclos de vigilia y sueo de su beb y cules son los factores que contribuyen a un aumento de los movimientos fetales. Es importante llevar a cabo un recuento de movimientos fetales, al mismo tiempo cada da, cuando el beb normalmente est ms activo.  CMO CONTAR LOS MOVIMIENTOS FETALES 1. Busque un lugar tranquilo y cmodo para sentarse o recostarse sobre el lado izquierdo. Al recostarse sobre su lado izquierdo, le proporciona una mejor circulacin de [name] y oxgeno al beb. 2. Anote el da y la hora en una hoja de papel o en un diario. 3. Comience contando las pataditas, revoloteos, chasquidos, vueltas o pinchazos en un perodo de 2 horas. Debe sentir al menos 10 movimientos en 2 horas. 4. Si no siente 10 movimientos en 2 horas, espere 2  3 horas y cuente de nuevo. Busque cambios en el patrn o si no cuenta lo suficiente en 2 horas. SOLICITE ATENCIN MDICA SI:   Siente menos de 10 pataditas en 2 horas, en dos intentos.  No hay movimientos durante una hora.  El patrn se modifica o le lleva ms tiempo Art gallery managercada da contar las 10 pataditas.  Siente que el beb no se mueve como lo hace habitualmente. Fecha: ____________ Movimientos: ____________ Paula BornHora de inicio: ____________ Paula BornHora de finalizacin: ____________  Paula NonesFecha:  ____________ Movimientos: ____________ Paula BornHora de inicio: ____________ Paula BornHora de finalizacin: ____________  Paula NonesFecha: ____________ Movimientos: ____________ Paula BornHora de inicio: ____________ Paula BornHora de finalizacin: ____________  Paula NonesFecha: ____________ Movimientos: ____________ Paula BornHora de inicio: ____________ Paula BornHora de finalizacin: ____________  Paula NonesFecha: ____________ Movimientos: ____________ Paula BornHora de inicio: ____________ Paula BornHora de finalizacin: ____________  Paula NonesFecha: ____________ Movimientos: ____________ Paula BornHora de inicio: ____________ Paula RussianHora de finalizacin: ____________  Paula NonesFecha: ____________ Movimientos: ____________ Paula BornHora de inicio: ____________ Paula BornHora de finalizacin: ____________  Paula NonesFecha: ____________ Movimientos: ____________ Paula BornHora de inicio: ____________ Paula BornHora de finalizacin: ____________  Paula NonesFecha: ____________ Movimientos: ____________ Paula BornHora de inicio: ____________ Paula BornHora de finalizacin: ____________  Paula NonesFecha: ____________ Movimientos: ____________ Paula BornHora de inicio: ____________ Paula RussianHora de finalizacin: ____________

## 2013-03-30 LAB — OB RESULTS CONSOLE GBS: STREP GROUP B AG: POSITIVE

## 2013-04-05 ENCOUNTER — Encounter (HOSPITAL_COMMUNITY): Payer: Self-pay | Admitting: *Deleted

## 2013-04-05 ENCOUNTER — Inpatient Hospital Stay (HOSPITAL_COMMUNITY)
Admission: AD | Admit: 2013-04-05 | Discharge: 2013-04-06 | Disposition: A | Discharge status: Home / Self Care | Payer: Medicaid Other | Source: Ambulatory Visit | Attending: Obstetrics and Gynecology | Admitting: Obstetrics and Gynecology

## 2013-04-05 DIAGNOSIS — O4700 False labor before 37 completed weeks of gestation, unspecified trimester: Secondary | ICD-10-CM | POA: Insufficient documentation

## 2013-04-05 DIAGNOSIS — O479 False labor, unspecified: Secondary | ICD-10-CM

## 2013-04-05 LAB — URINE MICROSCOPIC-ADD ON

## 2013-04-05 LAB — URINALYSIS, ROUTINE W REFLEX MICROSCOPIC
Bilirubin Urine: NEGATIVE
Glucose, UA: NEGATIVE mg/dL
Hgb urine dipstick: NEGATIVE
Ketones, ur: 15 mg/dL — AB
Nitrite: NEGATIVE
PH: 5.5 (ref 5.0–8.0)
Protein, ur: NEGATIVE mg/dL
Urobilinogen, UA: 0.2 mg/dL (ref 0.0–1.0)

## 2013-04-05 MED ORDER — NIFEDIPINE 10 MG PO CAPS
10.0000 mg | ORAL_CAPSULE | ORAL | Status: DC | PRN
  Administered 2013-04-05 (×3): 10 mg via ORAL
  Filled 2013-04-05 (×3): qty 1

## 2013-04-05 MED ORDER — LACTATED RINGERS IV SOLN
INTRAVENOUS | Status: DC
  Administered 2013-04-05 (×2): via INTRAVENOUS

## 2013-04-05 MED ORDER — TERBUTALINE SULFATE 1 MG/ML IJ SOLN
0.2500 mg | Freq: Once | INTRAMUSCULAR | Status: AC
  Administered 2013-04-05: 0.25 mg via SUBCUTANEOUS
  Filled 2013-04-05: qty 1

## 2013-04-05 NOTE — MAU Note (Signed)
Pt G5 P3 at 32.3wks having contractions since 0700. Taking procardia which is not relieving the contractions. Yellowish thick discharge with a little pink color.

## 2013-04-05 NOTE — MAU Provider Note (Signed)
History     CSN: [id number]  Arrival date and time: 04/05/13 1928   None     Chief Complaint  Patient presents with  . Contractions   HPI Ms Paula Villegas, Paula Villegas is a 38yo [id number] @ 32.3wks who presents for eval of ctx that have increased this afternoon despite regular Procardia dosing. Reports seeing a small pink spot on her pantyliner earlier today. Denies leaking or BRB. No recent IC. No dysuria, N/V/D or fever. [facility] and has been remarkable for 1) hx of 32wk del with 2nd baby 2) AMA   OB History   Grav Para Term Preterm Abortions TAB SAB Ect Mult Living   5 3 2 1 1  1   3       Past Medical History  Diagnosis Date  . No pertinent past medical history   . Preterm labor     PTL x2, PTD x1    Past Surgical History  Procedure Laterality Date  . Cholecystectomy    . Laparoscopic appendectomy  05/22/2011    Procedure: APPENDECTOMY LAPAROSCOPIC;  Surgeon: Velora Hecklerodd M Gerkin, MD;  [facility];  Service: General;  Laterality: N/A;  . Appendectomy      Family History  Problem Relation Age of Onset  . Hypertension Mother   . Hearing loss Neg Hx     History  Substance Use Topics  . Smoking status: Never Smoker   . Smokeless tobacco: Never Used  . Alcohol Use: No    Allergies: No Known Allergies  Prescriptions prior to admission  Medication Sig Dispense Refill  . NIFEdipine (PROCARDIA) 10 MG capsule Take 1 capsule (10 mg total) by mouth every 8 (eight) hours.  30 capsule  0  . Prenatal Vit-Fe Fumarate-FA (PRENATAL MULTIVITAMIN) TABS tablet Take 1 tablet by mouth daily at 12 noon.      . ranitidine (ZANTAC) 150 MG tablet Take 150 mg by mouth 2 (two) times daily as needed for heartburn.         ROS Physical Exam   Blood pressure 110/58, pulse 71, temperature 98 F (36.7 C), temperature source Oral, resp. rate 18, height 5\' 2"  (1.575 m), weight 69.31 kg (152 lb 12.8 oz), last menstrual period 08/06/2012.  Physical Exam  Constitutional:  She is oriented to person, place, and time. She appears well-developed.  HENT:  Head: Normocephalic.  Neck: Normal range of motion.  Cardiovascular: Normal rate.   Respiratory: Effort normal.  GI:  EFM 130s +accels, no decels Ctx q 4-8 mins with interspersed irritability  Genitourinary: Vagina normal.  Cx 1/thick/-3 (unchanged)  Musculoskeletal: Normal range of motion.  Neurological: She is alert and oriented to person, place, and time.  Skin: Skin is warm and dry.  Psychiatric: She has a normal mood and affect. Her behavior is normal. Thought content normal.   Urinalysis    Component Value Date/Time   COLORURINE YELLOW 04/05/2013 2010   APPEARANCEUR CLEAR 04/05/2013 2010   LABSPEC >1.030* 04/05/2013 2010   PHURINE 5.5 04/05/2013 2010   GLUCOSEU NEGATIVE 04/05/2013 2010   HGBUR NEGATIVE 04/05/2013 2010   BILIRUBINUR NEGATIVE 04/05/2013 2010   KETONESUR 15* 04/05/2013 2010   PROTEINUR NEGATIVE 04/05/2013 2010   UROBILINOGEN 0.2 04/05/2013 2010   NITRITE NEGATIVE 04/05/2013 2010   LEUKOCYTESUR SMALL* 04/05/2013 2010     MAU Course  Procedures  MDM Will give Procardia 10mg  q 20 mins x max of 3 doses as well as IVF and reeval. Cx unchanged, but  still with ctx approx 5-6/hr. Rev'd with Dr Emelda Fear- will give one dose of SQ Terb. Still with some ctx after terb, but had some chest discomfort with terb so will give Ambien and d/c home. S/p 2 L IVF.  Assessment and Plan  IUP at 32.3wks Preterm ctx without cx change  D/C home with Ambien Preterm labor precautions F/U at [facility] as sched  Cam Hai 04/05/2013, 9:39 PM

## 2013-04-06 ENCOUNTER — Inpatient Hospital Stay (HOSPITAL_COMMUNITY)
Admission: AD | Admit: 2013-04-06 | Discharge: 2013-04-10 | DRG: 778 | Disposition: A | Discharge status: Home / Self Care | Payer: Medicaid Other | Source: Ambulatory Visit | Attending: Obstetrics & Gynecology | Admitting: Obstetrics & Gynecology

## 2013-04-06 ENCOUNTER — Encounter (HOSPITAL_COMMUNITY): Payer: Self-pay | Admitting: *Deleted

## 2013-04-06 DIAGNOSIS — O4700 False labor before 37 completed weeks of gestation, unspecified trimester: Principal | ICD-10-CM | POA: Diagnosis present

## 2013-04-06 DIAGNOSIS — Z22330 Carrier of Group B streptococcus: Secondary | ICD-10-CM

## 2013-04-06 DIAGNOSIS — O9989 Other specified diseases and conditions complicating pregnancy, childbirth and the puerperium: Secondary | ICD-10-CM

## 2013-04-06 DIAGNOSIS — O99891 Other specified diseases and conditions complicating pregnancy: Secondary | ICD-10-CM

## 2013-04-06 DIAGNOSIS — O09529 Supervision of elderly multigravida, unspecified trimester: Secondary | ICD-10-CM | POA: Diagnosis present

## 2013-04-06 LAB — CBC
HCT: 36.5 % (ref 36.0–46.0)
Hemoglobin: 12.5 g/dL (ref 12.0–15.0)
MCH: 30.9 pg (ref 26.0–34.0)
MCHC: 34.2 g/dL (ref 30.0–36.0)
MCV: 90.1 fL (ref 78.0–100.0)
PLATELETS: 262 10*3/uL (ref 150–400)
RBC: 4.05 MIL/uL (ref 3.87–5.11)
RDW: 13.9 % (ref 11.5–15.5)
WBC: 9.7 10*3/uL (ref 4.0–10.5)

## 2013-04-06 LAB — TYPE AND SCREEN
ABO/RH(D): O POS
ANTIBODY SCREEN: NEGATIVE

## 2013-04-06 MED ORDER — MAGNESIUM SULFATE BOLUS VIA INFUSION
4.0000 g | Freq: Once | INTRAVENOUS | Status: AC
  Administered 2013-04-06: 4 g via INTRAVENOUS
  Filled 2013-04-06: qty 500

## 2013-04-06 MED ORDER — PRENATAL MULTIVITAMIN CH
1.0000 | ORAL_TABLET | Freq: Every day | ORAL | Status: DC
  Administered 2013-04-07 – 2013-04-10 (×4): 1 via ORAL
  Filled 2013-04-06 (×4): qty 1

## 2013-04-06 MED ORDER — DOCUSATE SODIUM 100 MG PO CAPS
100.0000 mg | ORAL_CAPSULE | Freq: Every day | ORAL | Status: DC
  Administered 2013-04-06 – 2013-04-10 (×5): 100 mg via ORAL
  Filled 2013-04-06 (×5): qty 1

## 2013-04-06 MED ORDER — ZOLPIDEM TARTRATE 5 MG PO TABS: 5.0000 mg | ORAL_TABLET | Freq: Every evening | ORAL | Status: DC | PRN

## 2013-04-06 MED ORDER — FAMOTIDINE 20 MG PO TABS
20.0000 mg | ORAL_TABLET | Freq: Every day | ORAL | Status: DC
  Administered 2013-04-06 – 2013-04-10 (×5): 20 mg via ORAL
  Filled 2013-04-06 (×5): qty 1

## 2013-04-06 MED ORDER — MAGNESIUM SULFATE 40 G IN LACTATED RINGERS - SIMPLE
2.0000 g/h | INTRAVENOUS | Status: DC
  Administered 2013-04-06: 2 g/h via INTRAVENOUS
  Administered 2013-04-07 – 2013-04-08 (×2): 3 g/h via INTRAVENOUS
  Filled 2013-04-06 (×4): qty 500

## 2013-04-06 MED ORDER — ACETAMINOPHEN 325 MG PO TABS
650.0000 mg | ORAL_TABLET | ORAL | Status: DC | PRN
  Administered 2013-04-07 – 2013-04-09 (×3): 650 mg via ORAL
  Filled 2013-04-06 (×3): qty 2

## 2013-04-06 MED ORDER — LACTATED RINGERS IV SOLN
INTRAVENOUS | Status: DC
  Administered 2013-04-06 – 2013-04-08 (×5): via INTRAVENOUS
  Administered 2013-04-08: 100 mL via INTRAVENOUS
  Administered 2013-04-09: 01:00:00 via INTRAVENOUS

## 2013-04-06 MED ORDER — CALCIUM CARBONATE ANTACID 500 MG PO CHEW
2.0000 | CHEWABLE_TABLET | ORAL | Status: DC | PRN
  Administered 2013-04-07: 400 mg via ORAL
  Filled 2013-04-06 (×2): qty 1

## 2013-04-06 MED ORDER — ZOLPIDEM TARTRATE 5 MG PO TABS
5.0000 mg | ORAL_TABLET | Freq: Every evening | ORAL | Status: DC | PRN
  Administered 2013-04-06: 5 mg via ORAL
  Filled 2013-04-06: qty 1

## 2013-04-06 MED ORDER — TERBUTALINE SULFATE 1 MG/ML IJ SOLN: 0.2500 mg | Freq: Once | INTRAMUSCULAR | Status: DC

## 2013-04-06 NOTE — MAU Provider Note (Signed)
`````  Attestation of Attending Supervision of Advanced Practitioner: Evaluation and management procedures were performed by the PA/NP/CNM/OB Fellow under my supervision/collaboration. Chart reviewed and agree with management and plan.  Derion Kreiter V 04/06/2013 3:22 PM

## 2013-04-06 NOTE — MAU Note (Signed)
Pt discharged last night from MAU pt states she had some vaginal bleeding when she got home , "just a little drop" today.

## 2013-04-06 NOTE — Progress Notes (Signed)
Paula Paula Villegas Paula Villegas is a 38 y.o. [phone] at 7722w4d  admitted for Preterm labor  Subjective: Pt is comfortable with less painful ctx with magnesium  Objective: BP 105/63  Pulse 71  Temp(Src) 98.1 F (36.7 Villegas) (Oral)  Resp 16  Ht 5\' 2"  (1.575 m)  Wt 69.31 kg (152 lb 12.8 oz)  BMI 27.94 kg/m2  SpO2 98%  LMP 08/06/2012      FHT:  FHR: 130s bpm, variability: moderate,  accelerations:  Present,  decelerations:  Absent UC:   regular, every 5-6 minutes SVE:   Dilation: 3 Effacement (%): Thick Station: -2 Exam by:: Dr Ike Benedom  Labs: Lab Results  Component Value Date   WBC 9.7 04/06/2013   HGB 12.5 04/06/2013   HCT 36.5 04/06/2013   MCV 90.1 04/06/2013   PLT 262 04/06/2013    Assessment / Plan: Preterm labor without cervical change over 2hrs  Labor: Attempting tocolysis with magnesium Fetal Wellbeing:  Category I Pain Control:  will tx as needed I/D:  GBS +, will treat if patient starts making cervical change Anticipated MOD:  NSVD  Paula Paula Villegas 04/06/2013, 6:10 PM

## 2013-04-06 NOTE — H&P (Signed)
Agree with admission as outlined above  Adam PhenixJames G Arnold, MD 04/06/2013

## 2013-04-06 NOTE — Consult Note (Signed)
Neonatology Consult  Note:  At the request of the patients obstetrician Dr. Roselie Awkward I met with Ms. Tassinari who is at 59 4 wks currently with pregnancy complicated by PTL, history of 32 week preterm delivery and multiple MAU visits for contractions during this pregnancy.   She was started on Procardia however is now on magnesium for tocolysis.  s/p betamethasone on 1/5 and 1/6.  Her son is now 40 yrs old and was born in [place].  She remembers very little of his NICU course however he was not intubated and seems to have had an uneventful course.  She states that he is now doing very well.  We reviewed initial delivery room management, including CPAP, [place], and low but certainly possible need for intubation for surfactant administration.  We discussed feeding immaturity and need for full po intake with multiple days of good weight gain before discharge.  We reviewed increased risk of jaundice, infection, and temperature instability.    Discussed likely length of stay. Thank you for allowing [place] to participate in her care.  Please call with questions.  Higinio Roger, DO  Neonatologist   The total length of face-to-face or floor / unit time for this encounter was 20 minutes.

## 2013-04-06 NOTE — Progress Notes (Signed)
Very  Small amount of blood noted

## 2013-04-06 NOTE — Plan of Care (Signed)
Problem: Consults Goal: Birthing Suites Patient Information Press F2 to bring up selections list   Pt < [redacted] weeks EGA     

## 2013-04-06 NOTE — Progress Notes (Signed)
Uterine irritability with occasional contractions noted

## 2013-04-06 NOTE — Discharge Instructions (Signed)
Informacin sobre el parto prematuro  (Preterm Labor Information) El parto prematuro comienza antes de la semana 37 de embarazo. La duracin de un embarazo normal es de 39 a 41 semanas.  CAUSAS  Generalmente no hay una causa que pueda identificarse del motivo por el que una mujer comienza un trabajo de parto prematuro. Sin embargo, una de las causas conocidas ms frecuentes son las infecciones. Las infecciones del tero, el cuello, la vagina, el lquido amnitico, la vejiga, los riones y hasta de los pulmones (neumona) pueden hacer que el trabajo de parto se inicie. Otras causas que pueden sospecharse son:   Infecciones urogenitales, como infecciones por hongos y vaginosis bacteriana.   Anormalidades uterinas (forma del tero, sptum uterino, fibromas, hemorragias en la placenta).   Un cuello que ha sido operado (puede ser que no permanezca cerrado).   Malformaciones del feto.   Gestaciones mltiples (mellizos, trillizos y ms).   Ruptura del saco amnitico.  FACTORES DE RIESGO   Historia previa de parto prematuro.   Tener ruptura prematura de las membranas (RPM).   La placenta cubre la abertura del cuello (placenta previa).   La placenta se separa del tero (abrupcin placentaria).   El cuello es demasiado dbil para contener al beb en el tero (cuello incompetente).   Hay mucho lquido en el saco amnitico (polihidramnios).   Consumo de drogas o hbito de fumar durante el embarazo.   No aumentar de peso lo suficiente durante el embarazo.   Mujeres menores de 18 aos o mayores de 35 aos.   Nivel socioeconmico bajo.   Pertenecer a la raza afroamericana. SNTOMAS  Los signos y sntomas del trabajo de parto prematuro son:   Clicos similares a los menstruales, dolor abdominal o dolor de espalda.  Contracciones uterinas regulares, tan frecuentes como seis por hora, sin importar su intensidad (pueden ser suaves o dolorosas).  Contracciones que comienzan  en la parte superior del tero y se expanden hacia abajo, a la zona inferior del abdomen y la espalda.   Sensacin de aumento de presin en la pelvis.   Aparece una secrecin acuosa o sanguinolenta por la vagina.  TRATAMIENTO  Segn el tiempo del embarazo y otras circunstancias, el mdico puede indicar reposo en cama. Si es necesario, le indicarn medicamentos para detener las contracciones y para madurar los pulmones del feto. Si el trabajo de parto se inicia antes de las 34 semanas de embarazo, se recomienda la hospitalizacin. El tratamiento depende de las condiciones en que se encuentren usted y el feto.  QU DEBE HACER SI PIENSA QUE EST EN TRABAJO DE PARTO PREMATURO?  Comunquese con su mdico inmediatamente. Debe concurrir al hospital para ser controlada inmediatamente.  CMO PUEDE EVITAR EL TRABAJO DE PARTO PREMATURO EN FUTUROS EMBARAZOS?  Usted debe:   Si fuma, abandonar el hbito.  Mantener un peso saludable y evitar sustancias qumicas y drogas innecesarias.  Controlar todo tipo de infeccin.  Informe a su mdico si tiene una historia conocida de trabajo de parto prematuro. Document Released: 06/16/2007 Document Revised: 11/09/2012 [employer] Patient Information 2014 [employer].  

## 2013-04-06 NOTE — H&P (Signed)
Paula Villegas is a 38 y.o. female [phone] at [redacted]w[redacted]d presenting for contractions and bleeding. She reports she has been having contractions off and on for the past few weeks and has been taking procardia for over a week. She has been having some small bloody streaks in her mucous discharge since she was checked in the MAU last night. She reports that her contractions had become more rare but today are getting more painful.   Maternal Medical History:  Reason for admission: Contractions.  Preterm labor, cervical dilation over the past 16 hours from 1cm to 3cm.  Contractions: Onset was less than 1 hour ago.   Frequency: irregular.   Perceived severity is moderate.    Fetal activity: Perceived fetal activity is normal.   Last perceived fetal movement was within the past hour.    Prenatal complications: Bleeding and preterm labor.   Prenatal Complications - Diabetes: none.    OB History   Grav Para Term Preterm Abortions TAB SAB Ect Mult Living   5 3 2 1 1  1   3      Past Medical History  Diagnosis Date  . No pertinent past medical history   . Preterm labor     PTL x2, PTD x1   Past Surgical History  Procedure Laterality Date  . Cholecystectomy    . Laparoscopic appendectomy  05/22/2011    Procedure: APPENDECTOMY LAPAROSCOPIC;  Surgeon: Velora Heckler, MD;  Location: [facility] OR;  Service: General;  Laterality: N/A;  . Appendectomy     Family History: family history includes Hypertension in her mother. There is no history of Hearing loss. Social History:  reports that she has never smoked. She has never used smokeless tobacco. She reports that she does not drink alcohol or use illicit drugs.   Prenatal Transfer Tool  Maternal Diabetes: No Genetic Screening: Normal Maternal Ultrasounds/Referrals: Normal Fetal Ultrasounds or other Referrals:  None Maternal Substance Abuse:  No Significant Maternal Medications:  None Significant Maternal Lab Results:  None Other Comments:   None  Review of Systems  Gastrointestinal: Positive for abdominal pain.  Neurological: Negative for headaches.  All other systems reviewed and are negative.   Dilation: 3 Effacement (%): Thick Station: -2 Exam by:: Dr. Ike Bene Blood pressure 104/66, pulse 88, temperature 98.3 F (36.8 C), temperature source Oral, resp. rate 18, last menstrual period 08/06/2012.  Maternal Exam:  Uterine Assessment: Contraction strength is moderate.  Contraction duration is 60 seconds. Abdomen: Patient reports no abdominal tenderness. Fetal presentation: vertex  Introitus: Normal vulva. Vagina is positive for vaginal discharge.  Ferning test: not done.  Nitrazine test: not done.  Pelvis: adequate for delivery.   Cervix: Cervix evaluated by sterile speculum exam and digital exam.     Fetal Exam Fetal Monitor Review: Mode: ultrasound.   Baseline rate: 130.  Variability: moderate (6-25 bpm).   Pattern: accelerations present and variable decelerations.    Fetal State Assessment: Category II - tracings are indeterminate.     Physical Exam  Nursing note and vitals reviewed. Constitutional: She is oriented to person, place, and time. She appears well-developed and well-nourished. No distress.  HENT:  Head: Normocephalic and atraumatic.  Eyes: Conjunctivae are normal. Right eye exhibits no discharge. Left eye exhibits no discharge. No scleral icterus.  Neck: Normal range of motion.  Cardiovascular: Normal rate, regular rhythm and normal heart sounds.   No murmur heard. Respiratory: Effort normal and breath sounds normal. No respiratory distress. She has no wheezes.  GI: Soft.  Gravid uterus  Genitourinary: Uterus normal. Vaginal discharge found.  Musculoskeletal: Normal range of motion. She exhibits no edema and no tenderness.  Neurological: She is alert and oriented to person, place, and time.  Skin: Skin is warm and dry. She is not diaphoretic.  Psychiatric: She has a normal mood and affect.  Her behavior is normal.    Prenatal labs: ABO, Rh: --/--/O POS (07/16 1109) Antibody:  neg Rubella:  immune RPR:   neg HBsAg:   neg HIV:   neg GBS:   positive  Assessment/Plan: Pt is a 38 y.o. [id number] at 2847w4d with a history of 32 week preterm delivery and multiple MAU visits for contractions during this pregnancy. She presents today with a small amount of bleeding and contractions which are less frequent than yesterday. She has dilated her cervix from 1cm to 3cm since last night and is contracting about ~5-10 minutes on the monitors.  - FWB: reactive on monitors, see above, vertex presentation on cervical check and u/s - admit to antenatal for preterm labor - initiate mag tocolysis, 4g bolus followed by 2g/hr continuous - continuous toco and fetal monitoring - neonatology consult  - s/p betamethasone on 1/5 and 1/6. Will not repeat at this time - will start GBS prophylaxis if unable to tocolyse pt and labor continues to progress  Beverely Lowdamo, Elena 04/06/2013, 4:34 PM  I spoke with and examined patient and agree with resident's note and plan of care.  Tawana ScaleMichael Ryan Marshea Wisher, MD OB Fellow 04/06/2013 6:00 PM

## 2013-04-07 LAB — MAGNESIUM: Magnesium: 6.1 mg/dL (ref 1.5–2.5)

## 2013-04-07 MED ORDER — SALINE SPRAY 0.65 % NA SOLN
1.0000 | NASAL | Status: DC | PRN
  Administered 2013-04-07: 1 via NASAL
  Filled 2013-04-07: qty 44

## 2013-04-07 NOTE — Progress Notes (Signed)
Patient feels dizzy. She is also experiencing nasal congestion.  BP 90/40.  I left message for Dr Ike Benedom to call after he is done in a delivery.

## 2013-04-07 NOTE — Progress Notes (Signed)
UR completed 

## 2013-04-07 NOTE — Progress Notes (Signed)
Patient ID: Paula Villegas, female   DOB: [birth year], 38 y.o.   MRN: [id number] FACULTY PRACTICE ANTEPARTUM(COMPREHENSIVE) NOTE  Paula Villegas is a 38 y.o. [id number] at 6322w5d by early ultrasound who is admitted for Preterm labor.   Fetal presentation is cephalic. Length of Stay:  1  Days  Subjective: Few contractions, less strong Patient reports the fetal movement as active. Patient reports uterine contraction  activity as irregular, every 15-20 minutes. Patient reports  vaginal bleeding as spotting. Patient describes fluid per vagina as None.  Vitals:  Blood pressure 88/46, pulse 82, temperature 98 F (36.7 C), temperature source Oral, resp. rate 16, height 5\' 2"  (1.575 m), weight 152 lb 12.8 oz (69.31 kg), last menstrual period 08/06/2012, SpO2 98.00%. Physical Examination:  General appearance - alert, well appearing, and in no distress Heart - normal rate and regular rhythm Abdomen - soft, nontender, nondistended Fundal Height:  size equals dates Cervical Exam: Evaluated by digital exam., Position: posterior, Dilation: 2cm, Thickness: 2 cm and Consistency: soft Floating and fetal presentation is cephalic. Extremities: extremities normal Membranes:intact  Fetal Monitoring:  Baseline: 145 bpm, Variability: Good {> 6 bpm), Accelerations: Reactive and Decelerations: Absent  Labs:  Results for orders placed during the hospital encounter of 04/06/13 (from the past 24 hour(s))  CBC   Collection Time    04/06/13  4:37 PM      Result Value Range   WBC 9.7  4.0 - 10.5 K/uL   RBC 4.05  3.87 - 5.11 MIL/uL   Hemoglobin 12.5  12.0 - 15.0 g/dL   HCT 13.036.5  86.536.0 - 78.446.0 %   MCV 90.1  78.0 - 100.0 fL   MCH 30.9  26.0 - 34.0 pg   MCHC 34.2  30.0 - 36.0 g/dL   RDW 69.613.9  29.511.5 - 28.415.5 %   Platelets 262  150 - 400 K/uL  TYPE AND SCREEN   Collection Time    04/06/13  4:37 PM      Result Value Range   ABO/RH(D) O POS     Antibody Screen NEG     Sample Expiration 04/09/2013       Imaging Studies:      Currently EPIC will not allow sonographic studies to automatically populate into notes.  In the meantime, copy and paste results into note or free text.  Medications:  Scheduled . docusate sodium  100 mg Oral Daily  . famotidine  20 mg Oral Daily  . prenatal multivitamin  1 tablet Oral Q1200   I have reviewed the patient's current medications.  ASSESSMENT: Patient Active Problem List   Diagnosis Date Noted  . Preterm labor 04/06/2013  . Appendicitis 05/22/2011  . IUD migration 03/02/2011  . ANEMIA-NOS 12/14/2008  . BILIARY COLIC 12/14/2008    PLAN: Continue magnesium increase to 3 grams per hour but consider d/c after 24 hr  Alyda Megna 04/07/2013,6:56 AM

## 2013-04-07 NOTE — Progress Notes (Signed)
Dr Ike Benedom gave order to stop magnesium sulfate and get magnesium level drawn.

## 2013-04-08 LAB — MAGNESIUM: Magnesium: 7 mg/dL (ref 1.5–2.5)

## 2013-04-08 MED ORDER — NIFEDIPINE 10 MG PO CAPS
10.0000 mg | ORAL_CAPSULE | Freq: Three times a day (TID) | ORAL | Status: DC
  Administered 2013-04-08 – 2013-04-10 (×7): 10 mg via ORAL
  Filled 2013-04-08 (×7): qty 1

## 2013-04-08 NOTE — Progress Notes (Signed)
Patient ID: Paula Villegas, female   DOB: 09/25/1975, 38 y.o.   MRN: [id number] Patient ID: Paula Villegas, female   DOB: 09/25/1975, 38 y.o.   MRN: [id number] FACULTY PRACTICE ANTEPARTUM(COMPREHENSIVE) NOTE  Paula Villegas is a 38 y.o. [id number] at 7431w6d  by early ultrasound who is admitted for Preterm labor.   Fetal presentation is cephalic. Length of Stay:  2  Days  Subjective: Still having contractions despite Mag level of 7, she is actually having chest pain and I am cutting the Mag back to 2 Patient reports the fetal movement as active. Patient reports uterine contraction  activity as irregular, every 15-20 minutes. Patient reports  vaginal bleeding as spotting. Patient describes fluid per vagina as None.  Vitals:  Blood pressure 109/62, pulse 83, temperature 98.2 F (36.8 C), temperature source Oral, resp. rate 16, height 5\' 2"  (1.575 m), weight 152 lb 12.8 oz (69.31 kg), last menstrual period 08/06/2012, SpO2 100.00%. Physical Examination:  General appearance - alert, well appearing, and in no distress Heart - normal rate and regular rhythm Abdomen - soft, nontender, nondistended Fundal Height:  size equals dates Cervical Exam: Evaluated by digital exam., Position: posterior, Dilation: 2cm, Thickness: 2 cm and Consistency: soft Floating and fetal presentation is cephalic. Extremities: extremities normal Membranes:intact  Fetal Monitoring:  Baseline: 145 bpm, Variability: Good {> 6 bpm), Accelerations: Reactive and Decelerations: Absent  Labs:  Results for orders placed during the hospital encounter of 04/06/13 (from the past 24 hour(s))  MAGNESIUM   Collection Time    04/07/13 12:34 PM      Result Value Range   Magnesium 6.1 (*) 1.5 - 2.5 mg/dL  MAGNESIUM   Collection Time    04/08/13  6:44 AM      Result Value Range   Magnesium 7.0 (*) 1.5 - 2.5 mg/dL    Imaging Studies:      Currently EPIC will not allow sonographic studies to automatically populate  into notes.  In the meantime, copy and paste results into note or free text.  Medications:  Scheduled . docusate sodium  100 mg Oral Daily  . famotidine  20 mg Oral Daily  . NIFEdipine  10 mg Oral Q8H  . prenatal multivitamin  1 tablet Oral Q1200   I have reviewed the patient's current medications.  ASSESSMENT: Patient Active Problem List   Diagnosis Date Noted  . Preterm labor 04/06/2013  . Appendicitis 05/22/2011  . IUD migration 03/02/2011  . ANEMIA-NOS 12/14/2008  . BILIARY COLIC 12/14/2008    Restart procardia and begin to wean magnesium, s/p betamethasone last week  Niyana Chesbro H 04/08/2013,7:39 AM

## 2013-04-09 DIAGNOSIS — O9989 Other specified diseases and conditions complicating pregnancy, childbirth and the puerperium: Secondary | ICD-10-CM

## 2013-04-09 DIAGNOSIS — O09529 Supervision of elderly multigravida, unspecified trimester: Secondary | ICD-10-CM

## 2013-04-09 DIAGNOSIS — O99891 Other specified diseases and conditions complicating pregnancy: Secondary | ICD-10-CM

## 2013-04-09 DIAGNOSIS — Z22330 Carrier of Group B streptococcus: Secondary | ICD-10-CM

## 2013-04-09 DIAGNOSIS — O4700 False labor before 37 completed weeks of gestation, unspecified trimester: Principal | ICD-10-CM

## 2013-04-09 LAB — TYPE AND SCREEN
ABO/RH(D): O POS
ANTIBODY SCREEN: NEGATIVE

## 2013-04-09 MED ORDER — SODIUM CHLORIDE 0.9 % IV SOLN: 250.0000 mL | INTRAVENOUS | Status: DC | PRN

## 2013-04-09 MED ORDER — TERBUTALINE SULFATE 1 MG/ML IJ SOLN
0.2500 mg | Freq: Once | INTRAMUSCULAR | Status: AC
  Administered 2013-04-09: 0.25 mg via SUBCUTANEOUS
  Filled 2013-04-09: qty 1

## 2013-04-09 MED ORDER — SODIUM CHLORIDE 0.9 % IJ SOLN: 3.0000 mL | INTRAMUSCULAR | Status: DC | PRN

## 2013-04-09 MED ORDER — SODIUM CHLORIDE 0.9 % IJ SOLN
3.0000 mL | Freq: Two times a day (BID) | INTRAMUSCULAR | Status: DC
  Administered 2013-04-09: 3 mL via INTRAVENOUS

## 2013-04-09 NOTE — Progress Notes (Signed)
Patient ID: Paula Villegas, female   DOB: [birth year], 38 y.o.   MRN: [id number] FACULTY PRACTICE ANTEPARTUM(COMPREHENSIVE) NOTE  Paula Villegas is a 38 y.o. [id number] at 8575w0d  by early ultrasound who is admitted for Preterm labor.   Fetal presentation is cephalic. Length of Stay:  3  Days  Subjective: Magnesium sulfate discontinued yesterday, on Procardia. Still having irregular contractions Patient reports the fetal movement as active. Patient reports uterine contraction  activity as irregular, every 15-20 minutes. Patient reports  vaginal bleeding as spotting. Patient describes fluid per vagina as None.  Vitals:  Blood pressure 97/56, pulse 76, temperature 98 F (36.7 C), temperature source Oral, resp. rate 18, height 5\' 2"  (1.575 m), weight 152 lb 12.8 oz (69.31 kg), last menstrual period 08/06/2012, SpO2 98.00%. Physical Examination: General appearance - alert, well appearing, and in no distress Abdomen - soft, nontender, nondistended Fundal Height:  size equals dates Cervical Exam: Evaluated by digital exam., Position: posterior, Dilation: 2.5 cm, Thick, High and fetal presentation is cephalic. Extremities: extremities normal Membranes:intact  Fetal Monitoring:  Baseline: 145 bpm, Variability: Good {> 6 bpm), Accelerations: Reactive and Decelerations: Absent  Labs:  No results found for this or any previous visit (from the past 24 hour(s)).  Medications:  Scheduled . docusate sodium  100 mg Oral Daily  . famotidine  20 mg Oral Daily  . NIFEdipine  10 mg Oral Q8H  . prenatal multivitamin  1 tablet Oral Q1200  . sodium chloride  3 mL Intravenous Q12H   I have reviewed the patient's current medications.  ASSESSMENT: Patient Active Problem List   Diagnosis Date Noted  . Preterm labor 04/06/2013  . Appendicitis 05/22/2011  . IUD migration 03/02/2011  . ANEMIA-NOS 12/14/2008  . BILIARY COLIC 12/14/2008    PLAN: Continue Procardia s/p betamethasone last week on  1/5 and 1/6 May discharge later today if stable Routine antental care   [facility] LLCNYANWU,UGONNA A, M.D. 04/09/2013,7:38 AM

## 2013-04-10 NOTE — Discharge Instructions (Signed)
Informacin sobre el parto prematuro  (Preterm Labor Information) El parto prematuro comienza antes de la semana 37 de embarazo. La duracin de un embarazo normal es de 39 a 41 semanas.  CAUSAS  Generalmente no hay una causa que pueda identificarse del motivo por el que una mujer comienza un trabajo de parto prematuro. Sin embargo, una de las causas conocidas ms frecuentes son las infecciones. Las infecciones del tero, el cuello, la vagina, el lquido amnitico, la vejiga, los riones y hasta de los pulmones (neumona) pueden hacer que el trabajo de parto se inicie. Otras causas que pueden sospecharse son:   Infecciones urogenitales, como infecciones por hongos y vaginosis bacteriana.   Anormalidades uterinas (forma del tero, sptum uterino, fibromas, hemorragias en la placenta).   Un cuello que ha sido operado (puede ser que no permanezca cerrado).   Malformaciones del feto.   Gestaciones mltiples (mellizos, trillizos y ms).   Ruptura del saco amnitico.  FACTORES DE RIESGO   Historia previa de parto prematuro.   Tener ruptura prematura de las membranas (RPM).   La placenta cubre la abertura del cuello (placenta previa).   La placenta se separa del tero (abrupcin placentaria).   El cuello es demasiado dbil para contener al beb en el tero (cuello incompetente).   Hay mucho lquido en el saco amnitico (polihidramnios).   Consumo de drogas o hbito de fumar durante el embarazo.   No aumentar de peso lo suficiente durante el embarazo.   Mujeres menores de 18 aos o mayores de 35 aos.   Nivel socioeconmico bajo.   Pertenecer a la raza afroamericana. SNTOMAS  Los signos y sntomas del trabajo de parto prematuro son:   Clicos similares a los menstruales, dolor abdominal o dolor de espalda.  Contracciones uterinas regulares, tan frecuentes como seis por hora, sin importar su intensidad (pueden ser suaves o dolorosas).  Contracciones que comienzan  en la parte superior del tero y se expanden hacia abajo, a la zona inferior del abdomen y la espalda.   Sensacin de aumento de presin en la pelvis.   Aparece una secrecin acuosa o sanguinolenta por la vagina.  TRATAMIENTO  Segn el tiempo del embarazo y otras circunstancias, el mdico puede indicar reposo en cama. Si es necesario, le indicarn medicamentos para detener las contracciones y para madurar los pulmones del feto. Si el trabajo de parto se inicia antes de las 34 semanas de embarazo, se recomienda la hospitalizacin. El tratamiento depende de las condiciones en que se encuentren usted y el feto.  QU DEBE HACER SI PIENSA QUE EST EN TRABAJO DE PARTO PREMATURO?  Comunquese con su mdico inmediatamente. Debe concurrir al hospital para ser controlada inmediatamente.  CMO PUEDE EVITAR EL TRABAJO DE PARTO PREMATURO EN FUTUROS EMBARAZOS?  Usted debe:   Si fuma, abandonar el hbito.  Mantener un peso saludable y evitar sustancias qumicas y drogas innecesarias.  Controlar todo tipo de infeccin.  Informe a su mdico si tiene una historia conocida de trabajo de parto prematuro. Document Released: 06/16/2007 Document Revised: 11/09/2012 [employer] Patient Information 2014 [employer].  

## 2013-04-10 NOTE — Progress Notes (Signed)
Pt voiced no concerns regarding dc instructions  [facility]

## 2013-04-10 NOTE — Discharge Summary (Signed)
Physician Discharge Summary  Patient ID: Paula GarbeMaria C Villegas MRN: [id number] DOB/AGE: 40-Sep-1977 40 y.o.  Admit date: 04/06/2013 Discharge date: 04/10/2013  Admission Diagnoses: 3764w1d PTL Discharge Diagnoses:  Active Problems:   Preterm labor   Discharged Condition: stable  Hospital Course: Magnesium tocolysis, resumed her procardai, received Betamethasone in her previous hospitalization  Consults: None  Significant Diagnostic Studies: none  Treatments: magnesium tocolysis  Discharge Exam: Blood pressure 114/53, pulse 56, temperature 97.9 F (36.6 C), temperature source Oral, resp. rate 18, height 5\' 2"  (1.575 m), weight 152 lb 12.8 oz (69.31 kg), last menstrual period 08/06/2012, SpO2 96.00%. Benign abdomen  Disposition: 01-Home or Self Care  Discharge Orders   Future Orders Complete By Expires   Diet - low sodium heart healthy  As directed    Increase activity slowly  As directed    Sexual Activity Restrictions  As directed    Comments:     No sex       Medication List         NIFEdipine 10 MG capsule  Commonly known as:  PROCARDIA  Take 1 capsule (10 mg total) by mouth every 8 (eight) hours.     prenatal multivitamin Tabs tablet  Take 1 tablet by mouth daily at 12 noon.     ranitidine 150 MG tablet  Commonly known as:  ZANTAC  Take 150 mg by mouth daily as needed for heartburn.           Follow-up Information   [facility] ([facility])    Contact information:   [address] [place] [id number]       Signed: Lazaro ArmsURE,LUTHER H 04/10/2013, 8:06 AM

## 2013-04-10 NOTE — Progress Notes (Signed)
Patient ID: Paula Villegas, female   DOB: September 09, 1975, 38 y.o.   MRN: [id number] CTSP by Marrion CoyLisa Brewer, RN, with concern pt is uncomfortable with UCs and having bloody show. Looks comfortable.  UCs irregular; FHR 140s, reactive VE: post, soft 2-3/thick/-3, scant dark show (no change.  A/P [id number] at 7435w5d. PTUCs Will discharge on Procardia as planned. Labor precautions and F/U at [facility] reviewed.

## 2013-04-10 NOTE — Progress Notes (Signed)
Pt states that she is having lower abd pressure that comes and goes and is spotting dark red MD is aware of spotting

## 2013-04-11 ENCOUNTER — Telehealth: Payer: Self-pay | Admitting: General Practice

## 2013-04-11 NOTE — Telephone Encounter (Signed)
Patient called to front office requesting a note for work for light duty at work. Patient's new OB appt with us is 1/29. Told patient that unfortunately since she has not been seen here in the clinic by one of our doctors we could not provide that note at this time but she could ask the provider she sees next week about that kind of note. Patient verbalized understanding and stated that was fine. Patient had no further questions

## 2013-04-20 ENCOUNTER — Encounter: Payer: Self-pay | Admitting: Obstetrics and Gynecology

## 2013-04-20 ENCOUNTER — Ambulatory Visit (INDEPENDENT_AMBULATORY_CARE_PROVIDER_SITE_OTHER): Payer: Medicaid Other | Admitting: Obstetrics and Gynecology

## 2013-04-20 DIAGNOSIS — O09529 Supervision of elderly multigravida, unspecified trimester: Secondary | ICD-10-CM

## 2013-04-20 DIAGNOSIS — O09893 Supervision of other high risk pregnancies, third trimester: Secondary | ICD-10-CM

## 2013-04-20 DIAGNOSIS — O09213 Supervision of pregnancy with history of pre-term labor, third trimester: Principal | ICD-10-CM

## 2013-04-20 DIAGNOSIS — O09219 Supervision of pregnancy with history of pre-term labor, unspecified trimester: Secondary | ICD-10-CM

## 2013-04-20 DIAGNOSIS — O09523 Supervision of elderly multigravida, third trimester: Secondary | ICD-10-CM

## 2013-04-20 LAB — POCT URINALYSIS DIP (DEVICE)
BILIRUBIN URINE: NEGATIVE
Glucose, UA: NEGATIVE mg/dL
Hgb urine dipstick: NEGATIVE
KETONES UR: NEGATIVE mg/dL
Nitrite: NEGATIVE
PROTEIN: NEGATIVE mg/dL
Specific Gravity, Urine: 1.02 (ref 1.005–1.030)
Urobilinogen, UA: 0.2 mg/dL (ref 0.0–1.0)
pH: 6.5 (ref 5.0–8.0)

## 2013-04-20 NOTE — Progress Notes (Signed)
Patient given an appointment with Fredrik RiggerJohn Triplett, PA on 04/24/13 at 815 [facility]

## 2013-04-20 NOTE — Progress Notes (Signed)
Patient was not seen today. Chart reviewed. Patient with h/o preterm delivery x 2 and declined 17-P. Patient s/p BMZ 1/5 and 1/6. Patient now at 6454w4d no need to transfer care this late in gestation as no further interventions will be taken. [facility] Misty StanleyLisa will ensure patient has follow up appointment with HD

## 2013-04-20 NOTE — Addendum Note (Signed)
Addended by: Franchot MimesALFARO, Taji Barretto on: 04/20/2013 10:27 AM   Modules accepted: Orders

## 2013-04-21 LAB — PRESCRIPTION MONITORING PROFILE (19 PANEL)
Amphetamine/Meth: NEGATIVE ng/mL
Barbiturate Screen, Urine: NEGATIVE ng/mL
Benzodiazepine Screen, Urine: NEGATIVE ng/mL
Buprenorphine, Urine: NEGATIVE ng/mL
CANNABINOID SCRN UR: NEGATIVE ng/mL
CARISOPRODOL, URINE: NEGATIVE ng/mL
CREATININE, URINE: 133.98 mg/dL (ref >20.0)
Cocaine Metabolites: NEGATIVE ng/mL
Fentanyl, Ur: NEGATIVE ng/mL
MDMA URINE: NEGATIVE ng/mL
MEPERIDINE UR: NEGATIVE ng/mL
METHADONE SCREEN, URINE: NEGATIVE ng/mL
METHAQUALONE SCREEN (URINE): NEGATIVE ng/mL
NITRITES URINE, INITIAL: NEGATIVE ug/mL
OXYCODONE SCRN UR: NEGATIVE ng/mL
Opiate Screen, Urine: NEGATIVE ng/mL
PHENCYCLIDINE, UR: NEGATIVE ng/mL
Propoxyphene: NEGATIVE ng/mL
Tapentadol, urine: NEGATIVE ng/mL
Tramadol Scrn, Ur: NEGATIVE ng/mL
Zolpidem, Urine: NEGATIVE ng/mL
pH, Initial: 6.7 pH (ref 4.5–8.9)

## 2013-04-24 ENCOUNTER — Encounter: Payer: Self-pay | Admitting: *Deleted

## 2013-05-06 ENCOUNTER — Inpatient Hospital Stay (HOSPITAL_COMMUNITY)
Admission: AD | Admit: 2013-05-06 | Discharge: 2013-05-06 | Disposition: A | Discharge status: Home / Self Care | Payer: Medicaid Other | Source: Ambulatory Visit | Attending: Obstetrics & Gynecology | Admitting: Obstetrics & Gynecology

## 2013-05-06 ENCOUNTER — Encounter (HOSPITAL_COMMUNITY): Payer: Self-pay | Admitting: *Deleted

## 2013-05-06 DIAGNOSIS — O4700 False labor before 37 completed weeks of gestation, unspecified trimester: Secondary | ICD-10-CM | POA: Insufficient documentation

## 2013-05-06 LAB — URINALYSIS, ROUTINE W REFLEX MICROSCOPIC
BILIRUBIN URINE: NEGATIVE
Glucose, UA: NEGATIVE mg/dL
HGB URINE DIPSTICK: NEGATIVE
KETONES UR: 40 mg/dL — AB
Leukocytes, UA: NEGATIVE
Nitrite: NEGATIVE
Protein, ur: NEGATIVE mg/dL
Specific Gravity, Urine: 1.01 (ref 1.005–1.030)
UROBILINOGEN UA: 0.2 mg/dL (ref 0.0–1.0)
pH: 6 (ref 5.0–8.0)

## 2013-05-06 NOTE — Discharge Instructions (Signed)

## 2013-05-06 NOTE — MAU Note (Signed)
Patient presents with complaint of irregular contractions since yesterday that progressed to every 10 minutes at midnight and have remained so but gotten stronger.

## 2013-05-08 ENCOUNTER — Inpatient Hospital Stay (HOSPITAL_COMMUNITY)
Admission: AD | Admit: 2013-05-08 | Discharge: 2013-05-09 | Disposition: A | Discharge status: Home / Self Care | Payer: Medicaid Other | Source: Ambulatory Visit | Attending: Obstetrics & Gynecology | Admitting: Obstetrics & Gynecology

## 2013-05-08 ENCOUNTER — Encounter (HOSPITAL_COMMUNITY): Payer: Self-pay | Admitting: *Deleted

## 2013-05-08 DIAGNOSIS — O09893 Supervision of other high risk pregnancies, third trimester: Secondary | ICD-10-CM

## 2013-05-08 DIAGNOSIS — O368390 Maternal care for abnormalities of the fetal heart rate or rhythm, unspecified trimester, not applicable or unspecified: Secondary | ICD-10-CM | POA: Insufficient documentation

## 2013-05-08 DIAGNOSIS — O09523 Supervision of elderly multigravida, third trimester: Secondary | ICD-10-CM

## 2013-05-08 DIAGNOSIS — O479 False labor, unspecified: Secondary | ICD-10-CM | POA: Insufficient documentation

## 2013-05-08 DIAGNOSIS — O09529 Supervision of elderly multigravida, unspecified trimester: Secondary | ICD-10-CM

## 2013-05-08 DIAGNOSIS — O09213 Supervision of pregnancy with history of pre-term labor, third trimester: Secondary | ICD-10-CM

## 2013-05-08 NOTE — MAU Note (Signed)
SAYS SHE WAS IN CLINIC AT 4 PM  AND FHR- WAS 108-112- TOLD TO COME HERE.-  SO SHE HAD TO WAIT FOR  HER HUSBAND ,  SAYS SHE WAS HAVING UC Q 7 MIN..   DENIES HSV AND MRSA.

## 2013-05-08 NOTE — MAU Provider Note (Signed)
History     CSN: [id number]  Arrival date and time: 05/08/13 2218   None     No chief complaint on file.  HPI  Pt is a 38 y/o [id number] at 37.1 [facility] for fetal heart rate of 108 and frequent contractions. She went home and had to wait several hours to wait for her husband to come and drive her to the MAU. In the interim her contractions have continued to be approx q 10 minutes and her pain has worsened. She states her pelvic pain and contractions are a 7/10. She also complains of intermittent scotoma and headache with contractions, she also complains of worsening edema. SHe denies vaginal bleeding or decreased fetal movement. She states she has had some thick white vaginal discharge today.   OB History   Grav Para Term Preterm Abortions TAB SAB Ect Mult Living   5 3 2 1 1  1   3       Past Medical History  Diagnosis Date  . No pertinent past medical history   . Preterm labor     PTL x2, PTD x1    Past Surgical History  Procedure Laterality Date  . Cholecystectomy    . Laparoscopic appendectomy  05/22/2011    Procedure: APPENDECTOMY LAPAROSCOPIC;  Surgeon: Velora Hecklerodd M Gerkin, MD;  [facility];  Service: General;  Laterality: N/A;  . Appendectomy    . Cholecystectomy N/A     Family History  Problem Relation Age of Onset  . Hypertension Mother   . Cancer Mother   . Hearing loss Neg Hx     History  Substance Use Topics  . Smoking status: Never Smoker   . Smokeless tobacco: Never Used  . Alcohol Use: Yes     Comment: not while pregnant    Allergies: No Known Allergies  Prescriptions prior to admission  Medication Sig Dispense Refill  . Prenatal Vit-Fe Fumarate-FA (PRENATAL MULTIVITAMIN) TABS tablet Take 1 tablet by mouth daily at 12 noon.      . ranitidine (ZANTAC) 150 MG tablet Take 150 mg by mouth daily as needed for heartburn.       Marland Kitchen. NIFEdipine (PROCARDIA) 10 MG capsule Take 1 capsule (10 mg total) by mouth every 8 (eight) hours.  30  capsule  0    ROS Per HPI Physical Exam   Blood pressure 116/75, pulse 79, temperature 97.8 F (36.6 C), temperature source Oral, resp. rate 16, height 5\' 3"  (1.6 m), weight 70.308 kg (155 lb), last menstrual period 08/06/2012.  Physical Exam General: well appearing pregnant female Head: NCAT Abd: Gravid, no tenderness to palpation Extremities: trace edema BL LE  Dilation: 3 Effacement (%): 60 Station: -3 Exam by:: Elie ConferK. Weiss RN  FHT: Baseline HR 115, Moderate variability, Positive accels, no decels Toco: Irregular contractions q 3 to 15 minutes  MAU Course  Procedures    Assessment and Plan   38 y/o [id number] @[redacted]w[redacted]d  with Hx of premature labor s/p BMZ sent for labor evaluation with concern for fetal bradycardia. FHT are reassuring and her contractions are irregular with a stable cervix after re-check. She has a normal blood pressure making pre-eclampsia unlikely. She was given IM pain meds and discharged home after thorough discussion about signs of labor and reasons to return.    Kevin FentonBradshaw, Samuel 05/08/2013, 11:05 PM   I have seen and examined this patient and agree with above documentation in the resident's note. 38 yo [id number] @ 2934w2d who presents from  the HD for a low FHR at 108.  She was watched here with a reactive category I tacing and baseline 115s.  Also irregularly contracting but no cervical change. D/c to home and f/u at HD or here in MAU for labor.   Rulon Abide, M.D. [facility] Fellow 05/09/2013 1:43 AM

## 2013-05-09 DIAGNOSIS — O479 False labor, unspecified: Secondary | ICD-10-CM

## 2013-05-09 MED ORDER — NALBUPHINE HCL 10 MG/ML IJ SOLN
10.0000 mg | Freq: Once | INTRAMUSCULAR | Status: AC
Start: 2013-05-09 — End: 2013-05-09
  Administered 2013-05-09: 10 mg via INTRAMUSCULAR

## 2013-05-09 MED ORDER — NALBUPHINE HCL 10 MG/ML IJ SOLN
10.0000 mg | Freq: Once | INTRAMUSCULAR | Status: DC
  Filled 2013-05-09: qty 1

## 2013-05-09 NOTE — Discharge Instructions (Signed)
Tercer trimestre del embarazo  (Third Trimester of Pregnancy) El tercer trimestre del embarazo abarca desde la semana 29 hasta la semana 42, desde el 7 mes hasta el 9. En este trimestre el feto se desarrolla muy rpidamente. Hacia el final del noveno mes, el beb que an no ha nacido mide alrededor de 20 pulgadas (45 cm) de largo y pesa entre 6 y 10 libras (2,700 y 4,500 kg).  CAMBIOS CORPORALES  Su organismo atravesar numerosos cambios durante el embarazo. Los cambios varan de una mujer a otra.   Seguir aumentando de peso. Es esperable que aumente entre 25 y 35 libras (11 16 kg) hacia el final del embarazo.  Podrn aparecer las primeras estras en las caderas, abdomen y mamas.  Tendr necesidad de orinar con ms frecuencia porque el feto baja hacia la pelvis y presiona en la vejiga.  Como consecuencia del embarazo, podr sentir acidez estomacal continuamente.  Podr estar constipada ya que ciertas hormonas hacen que los msculos que hacen progresar los desechos a travs de los intestinos trabajen ms lentamente.  Pueden aparecer hemorroides o abultarse e hincharse las venas (venas varicosas).  Podr sentir dolor plvico debido al aumento de peso ya que las hormonas del embarazo relajan las articulaciones entre los huesos de la pelvis. El dolor de espalda puede ser consecuencia de la exigencia de los msculos que soportan la postura.  Sus mamas seguirn desarrollndose y estarn ms sensibles. A veces sale una secrecin amarilla de las mamas, que se llama calostro.  El ombligo puede salir hacia afuera.  Podr sentir que le falta el aire debido a que se expande el tero.  Podr notar que el feto "baja" o que se siente ms bajo en el abdomen.  Podr tener una prdida de secrecin mucosa con sangre. Esto suele ocurrir entre unos pocos das y una semana antes del parto.  El cuello se vuelve delgado y blando (se borra) cerca de la fecha de parto. QU DEBE ESPERAR EN LAS CONSULTAS  PRENATALES  Le harn exmenes prenatales cada 2 semanas hasta la semana 36. A partir de ese momento le harn exmenes semanales. Durante una visita prenatal de rutina:   La pesarn para verificar que usted y el feto se encuentran dentro de los lmites normales.  Le tomarn la presin arterial.  Le medirn el abdomen para verificar el desarrollo del beb.  Escucharn los latidos fetales.  Se evaluarn los resultados de los estudios solicitados en visitas anteriores.  Le controlarn el cuello del tero cuando est prxima la fecha de parto para ver si se ha borrado. Alrededor de la semana 36 el mdico controlar el cuello del tero. Al mismo tiempo realizar un anlisis de las secreciones del tejido vaginal. Este examen es para determinar si hay un tipo de bacteria, estreptococo Grupo B. El mdico le explicar esto con ms detalle.  El mdico podr preguntarle:   Como le gustara que fuera el parto.  Cmo se siente.  Si siente los movimientos del beb.  Si tiene sntomas anormales, como prdida de lquido, sangrado, dolores de cabeza intenso o clicos abdominales.  Si tiene alguna duda. Otros estudios que podrn realizarse durante el tercer trimestre son:   Anlisis de sangre para controlar sus niveles de hierro (anemia).  Controles fetales para determinar su salud, el nivel de actividad y su desarrollo. Si tiene alguna enfermedad o si tuvo problemas durante el embarazo, le harn estudios. FALSO TRABAJO DE PARTO  Es posible que sienta contracciones pequeas e irregulares que finalmente   desaparecen. Se llaman contracciones de Braxton Hicks o falso trabajo de parto. Las contracciones pueden durar horas, das o an semanas antes de que el verdadero trabajo de parto se inicie. Si las contracciones tienen intervalos regulares, se intensifican o se hacen dolorosas, lo mejor es que la revise su mdico.  SIGNOS DE TRABAJO DE PARTO   Espasmos del tipo menstrual.  Contracciones cada 5  minutos o menos.  Contracciones que comienzan en la parte superior del tero y se expanden hacia abajo, a la zona inferior del abdomen y la espalda.  Sensacin de presin que aumenta en la pelvis o dolor en la espalda.  Aparece una secrecin acuosa o sanguinolenta por la vagina. Si tiene alguno de estos signos antes de la semana 37 del embarazo, llame a su mdico inmediatamente. Debe concurrir al hospital para ser controlada inmediatamente.  INSTRUCCIONES PARA EL CUIDADO EN EL HOGAR   Evite fumar, consumir hierbas, beber alcohol y utilizar frmacos que no le hayan recetado. Estas sustancias qumicas afectan la formacin y el desarrollo del beb.  Siga las indicaciones del profesional con respecto a como tomar los medicamentos. Durante el embarazo, hay medicamentos que son seguros y otros no lo son.  Realice actividad fsica slo segn las indicaciones del mdico. Sentir clicos uterinos es el mejor signo para detener la actividad fsica.  Contine haciendo comidas regulares y sanas.  Use un sostn que le brinde buen soporte si sus mamas estn sensibles.  No utilice la baera con agua caliente, baos turcos o saunas.  Colquese el cinturn de seguridad cuando conduzca.  Evite comer carne cruda queso sin cocinar y el contacto con los utensilios y desperdicios de los gatos. Estos elementos contienen grmenes que pueden causar defectos de nacimiento en el beb.  Tome las vitaminas indicadas para la etapa prenatal.  Pruebe un laxante (si el mdico la autoriza) si tiene constipacin. Consuma ms alimentos ricos en fibra, como vegetales y frutas frescos y cereales enteros. Beba gran cantidad de lquido para mantener la orina de tono claro o amarillo plido.  Tome baos de agua tibia para calmar el dolor o las molestias causadas por las hemorroides. Use una crema para las hemorroides si el mdico la autoriza.  Si tiene venas varicosas, use medias de soporte. Eleve los pies durante 15 minutos,  3 4 veces por da. Limite el consumo de sal en su dieta.  Evite levantar objetos pesados, use zapatos de tacones bajos y mantenga una buena postura.  Descanse con las piernas elevadas si tiene calambres o dolor de cintura.  Visite a su dentista si no lo ha hecho durante el embarazo. Use un cepillo de dientes blando para higienizarse los dientes y use suavemente el hilo dental.  Puede continuar su vida sexual excepto que el mdico le indique otra cosa.  No haga viajes largos excepto que sea absolutamente necesario y slo con la aprobacin de su mdico.  Tome clases prenatales para entender, practicar y hacer preguntas sobre el trabajo de parto y el alumbramiento.  Haga un ensayo sobre la partida al hospital.  Prepare el bolso que llevar al hospital.  Prepare la habitacin del beb.  Contine concurriendo a todas las visitas prenatales segn las indicaciones de su mdico. SOLICITE ATENCIN MDICA SI:   No est segura si est en trabajo de parto o ha roto la bolsa de aguas.  Tiene mareos.  Siente clicos leves, presin en la pelvis o dolor persistente en el abdomen.  Tiene nuseas o vmitos persistentes.  Observa una   secrecin vaginal con mal olor.  Siente dolor al orinar. SOLICITE ATENCIN MDICA DE INMEDIATO SI:   Tiene fiebre.  Pierde lquido o sangre por la vagina.  Tiene sangrado o pequeas prdidas vaginales.  Siente dolor intenso o clicos en el abdomen.  Sube o baja de peso rpidamente.  Le falta el aire y le duele el pecho al respirar.  Sbitamente se le hincha el rostro, las manos, los tobillos, los pies o las piernas de manera extrema.  No ha sentido los movimientos del beb durante una hora.  Siente un dolor de cabeza intenso que no se alivia con medicamentos.  Su visin se modifica. Document Released: 12/17/2004 Document Revised: 11/09/2012 [employer] Patient Information 2014 [employer].  

## 2013-05-10 NOTE — MAU Provider Note (Signed)
Attestation of Attending Supervision of Fellow: Evaluation and management procedures were performed by the Fellow under my supervision and collaboration.  I have reviewed the Fellow's note and chart, and I agree with the management and plan.    

## 2013-05-17 ENCOUNTER — Inpatient Hospital Stay (HOSPITAL_COMMUNITY)
Admission: AD | Admit: 2013-05-17 | Discharge: 2013-05-17 | Disposition: A | Discharge status: Home / Self Care | Payer: Medicaid Other | Source: Ambulatory Visit | Attending: Obstetrics & Gynecology | Admitting: Obstetrics & Gynecology

## 2013-05-17 ENCOUNTER — Encounter (HOSPITAL_COMMUNITY): Payer: Self-pay | Admitting: *Deleted

## 2013-05-17 DIAGNOSIS — O09893 Supervision of other high risk pregnancies, third trimester: Secondary | ICD-10-CM

## 2013-05-17 DIAGNOSIS — O479 False labor, unspecified: Secondary | ICD-10-CM | POA: Insufficient documentation

## 2013-05-17 DIAGNOSIS — N898 Other specified noninflammatory disorders of vagina: Secondary | ICD-10-CM

## 2013-05-17 DIAGNOSIS — O26899 Other specified pregnancy related conditions, unspecified trimester: Secondary | ICD-10-CM

## 2013-05-17 DIAGNOSIS — O09523 Supervision of elderly multigravida, third trimester: Secondary | ICD-10-CM

## 2013-05-17 DIAGNOSIS — O09529 Supervision of elderly multigravida, unspecified trimester: Secondary | ICD-10-CM

## 2013-05-17 DIAGNOSIS — O09213 Supervision of pregnancy with history of pre-term labor, third trimester: Secondary | ICD-10-CM

## 2013-05-17 HISTORY — DX: Unspecified ovarian cyst, unspecified side: N83.209

## 2013-05-17 LAB — AMNISURE RUPTURE OF MEMBRANE (ROM) NOT AT ARMC: Amnisure ROM: NEGATIVE

## 2013-05-17 NOTE — MAU Provider Note (Signed)
Attestation of Attending Supervision of Fellow: Evaluation and management procedures were performed by the Fellow under my supervision and collaboration.  I have reviewed the Fellow's note and chart, and I agree with the management and plan.    

## 2013-05-17 NOTE — MAU Note (Signed)
First noted fluid at 0900 yesterday, continued yesterday.  Small amt today.  No bleeding.

## 2013-05-17 NOTE — H&P (Deleted)
Chief Complaint:  Rupture of Membranes and Labor Eval   Paula Villegas is a 38 y.o.  [phone] with IUP at [redacted]w[redacted]d presenting for Rupture of Membranes and Labor Eval  [facility]. States she has had a more watery discharge over the last day that seems to come and go.  Was seen this AM and had a positive nitrazine but neg pool and neg fern.  States has been contracting for the last several weeks but since Saturday they have gotten stronger but still irregular.  No VB. +FM.     Menstrual History: OB History   Grav Para Term Preterm Abortions TAB SAB Ect Mult Living   5 3 2 1 1  1   3        Patient's last menstrual period was 08/06/2012.      Past Medical History  Diagnosis Date  . No pertinent past medical history   . Preterm labor     PTL x2, PTD x1  . Ovarian cyst     Past Surgical History  Procedure Laterality Date  . Cholecystectomy    . Laparoscopic appendectomy  05/22/2011    Procedure: APPENDECTOMY LAPAROSCOPIC;  Surgeon: Velora Heckler, MD;  Location: [facility] OR;  Service: General;  Laterality: N/A;  . Appendectomy    . Cholecystectomy N/A     Family History  Problem Relation Age of Onset  . Hypertension Mother   . Cancer Mother     reproductive  . Alcohol abuse Mother   . Hearing loss Neg Hx   . Asthma Brother     History  Substance Use Topics  . Smoking status: Never Smoker   . Smokeless tobacco: Never Used  . Alcohol Use: Yes     Comment: not while pregnant     No Known Allergies  Prescriptions prior to admission  Medication Sig Dispense Refill  . Prenatal Vit-Fe Fumarate-FA (PRENATAL MULTIVITAMIN) TABS tablet Take 1 tablet by mouth daily at 12 noon.        Review of Systems - Negative except for what is mentioned in HPI.  Physical Exam  Blood pressure 118/76, pulse 75, resp. rate 18, height 5' 2.5" (1.588 m), weight 70.852 kg (156 lb 3.2 oz), last menstrual period 08/06/2012, SpO2 99.00%. GENERAL:  Well-developed, well-nourished female in no acute distress.  LUNGS: Clear to auscultation bilaterally.  HEART: Regular rate and rhythm. ABDOMEN: Soft, nontender, nondistended, gravid.  EXTREMITIES: Nontender, no edema, 2+ distal pulses. Cervical Exam: Dilatation 3cm   Effacement 60%   Station -2   Presentation: cephalic FHT:  Baseline rate 120 bpm   Variability moderate  Accelerations present   Decelerations none Contractions: 2-5 min Labs: Results for orders placed during the hospital encounter of 05/17/13 (from the past 24 hour(s))  AMNISURE RUPTURE OF MEMBRANE (ROM)   Collection Time    05/17/13  1:47 PM      Result Value Ref Range   Amnisure ROM NEGATIVE      Imaging Studies:  No results found.  Assessment: KARLETTA Villegas is  38 y.o. [phone] at [redacted]w[redacted]d presents with Rupture of Membranes and Labor Eval .  Plan:  - speculum exam negative but + nitrazine in [facility] - amnisure today negative - reassurancge given that exam now neg x 3 with neg amnisure - cervix unchanged from >1 week ago - labor precautions discussed - f/u in clinic as scheduled  D/c to home FWB- cat I tracing.    [name],  [name] 2/25/20153:19 PM

## 2013-05-17 NOTE — Discharge Instructions (Signed)
Tercer trimestre del embarazo  (Third Trimester of Pregnancy) El tercer trimestre del embarazo abarca desde la semana 29 hasta la semana 42, desde el 7 mes hasta el 9. En este trimestre el feto se desarrolla muy rpidamente. Hacia el final del noveno mes, el beb que an no ha nacido mide alrededor de 20 pulgadas (45 cm) de largo y pesa entre 6 y 10 libras (2,700 y 4,500 kg).  CAMBIOS CORPORALES  Su organismo atravesar numerosos cambios durante el embarazo. Los cambios varan de una mujer a otra.   Seguir aumentando de peso. Es esperable que aumente entre 25 y 35 libras (11 16 kg) hacia el final del embarazo.  Podrn aparecer las primeras estras en las caderas, abdomen y mamas.  Tendr necesidad de orinar con ms frecuencia porque el feto baja hacia la pelvis y presiona en la vejiga.  Como consecuencia del embarazo, podr sentir acidez estomacal continuamente.  Podr estar constipada ya que ciertas hormonas hacen que los msculos que hacen progresar los desechos a travs de los intestinos trabajen ms lentamente.  Pueden aparecer hemorroides o abultarse e hincharse las venas (venas varicosas).  Podr sentir dolor plvico debido al aumento de peso ya que las hormonas del embarazo relajan las articulaciones entre los huesos de la pelvis. El dolor de espalda puede ser consecuencia de la exigencia de los msculos que soportan la postura.  Sus mamas seguirn desarrollndose y estarn ms sensibles. A veces sale una secrecin amarilla de las mamas, que se llama calostro.  El ombligo puede salir hacia afuera.  Podr sentir que le falta el aire debido a que se expande el tero.  Podr notar que el feto "baja" o que se siente ms bajo en el abdomen.  Podr tener una prdida de secrecin mucosa con sangre. Esto suele ocurrir entre unos pocos das y una semana antes del parto.  El cuello se vuelve delgado y blando (se borra) cerca de la fecha de parto. QU DEBE ESPERAR EN LAS CONSULTAS  PRENATALES  Le harn exmenes prenatales cada 2 semanas hasta la semana 36. A partir de ese momento le harn exmenes semanales. Durante una visita prenatal de rutina:   La pesarn para verificar que usted y el feto se encuentran dentro de los lmites normales.  Le tomarn la presin arterial.  Le medirn el abdomen para verificar el desarrollo del beb.  Escucharn los latidos fetales.  Se evaluarn los resultados de los estudios solicitados en visitas anteriores.  Le controlarn el cuello del tero cuando est prxima la fecha de parto para ver si se ha borrado. Alrededor de la semana 36 el mdico controlar el cuello del tero. Al mismo tiempo realizar un anlisis de las secreciones del tejido vaginal. Este examen es para determinar si hay un tipo de bacteria, estreptococo Grupo B. El mdico le explicar esto con ms detalle.  El mdico podr preguntarle:   Como le gustara que fuera el parto.  Cmo se siente.  Si siente los movimientos del beb.  Si tiene sntomas anormales, como prdida de lquido, sangrado, dolores de cabeza intenso o clicos abdominales.  Si tiene alguna duda. Otros estudios que podrn realizarse durante el tercer trimestre son:   Anlisis de sangre para controlar sus niveles de hierro (anemia).  Controles fetales para determinar su salud, el nivel de actividad y su desarrollo. Si tiene alguna enfermedad o si tuvo problemas durante el embarazo, le harn estudios. FALSO TRABAJO DE PARTO  Es posible que sienta contracciones pequeas e irregulares que finalmente   desaparecen. Se llaman contracciones de Braxton Hicks o falso trabajo de parto. Las contracciones pueden durar horas, das o an semanas antes de que el verdadero trabajo de parto se inicie. Si las contracciones tienen intervalos regulares, se intensifican o se hacen dolorosas, lo mejor es que la revise su mdico.  SIGNOS DE TRABAJO DE PARTO   Espasmos del tipo menstrual.  Contracciones cada 5  minutos o menos.  Contracciones que comienzan en la parte superior del tero y se expanden hacia abajo, a la zona inferior del abdomen y la espalda.  Sensacin de presin que aumenta en la pelvis o dolor en la espalda.  Aparece una secrecin acuosa o sanguinolenta por la vagina. Si tiene alguno de estos signos antes de la semana 37 del embarazo, llame a su mdico inmediatamente. Debe concurrir al hospital para ser controlada inmediatamente.  INSTRUCCIONES PARA EL CUIDADO EN EL HOGAR   Evite fumar, consumir hierbas, beber alcohol y utilizar frmacos que no le hayan recetado. Estas sustancias qumicas afectan la formacin y el desarrollo del beb.  Siga las indicaciones del profesional con respecto a como tomar los medicamentos. Durante el embarazo, hay medicamentos que son seguros y otros no lo son.  Realice actividad fsica slo segn las indicaciones del mdico. Sentir clicos uterinos es el mejor signo para detener la actividad fsica.  Contine haciendo comidas regulares y sanas.  Use un sostn que le brinde buen soporte si sus mamas estn sensibles.  No utilice la baera con agua caliente, baos turcos o saunas.  Colquese el cinturn de seguridad cuando conduzca.  Evite comer carne cruda queso sin cocinar y el contacto con los utensilios y desperdicios de los gatos. Estos elementos contienen grmenes que pueden causar defectos de nacimiento en el beb.  Tome las vitaminas indicadas para la etapa prenatal.  Pruebe un laxante (si el mdico la autoriza) si tiene constipacin. Consuma ms alimentos ricos en fibra, como vegetales y frutas frescos y cereales enteros. Beba gran cantidad de lquido para mantener la orina de tono claro o amarillo plido.  Tome baos de agua tibia para calmar el dolor o las molestias causadas por las hemorroides. Use una crema para las hemorroides si el mdico la autoriza.  Si tiene venas varicosas, use medias de soporte. Eleve los pies durante 15 minutos,  3 4 veces por da. Limite el consumo de sal en su dieta.  Evite levantar objetos pesados, use zapatos de tacones bajos y mantenga una buena postura.  Descanse con las piernas elevadas si tiene calambres o dolor de cintura.  Visite a su dentista si no lo ha hecho durante el embarazo. Use un cepillo de dientes blando para higienizarse los dientes y use suavemente el hilo dental.  Puede continuar su vida sexual excepto que el mdico le indique otra cosa.  No haga viajes largos excepto que sea absolutamente necesario y slo con la aprobacin de su mdico.  Tome clases prenatales para entender, practicar y hacer preguntas sobre el trabajo de parto y el alumbramiento.  Haga un ensayo sobre la partida al hospital.  Prepare el bolso que llevar al hospital.  Prepare la habitacin del beb.  Contine concurriendo a todas las visitas prenatales segn las indicaciones de su mdico. SOLICITE ATENCIN MDICA SI:   No est segura si est en trabajo de parto o ha roto la bolsa de aguas.  Tiene mareos.  Siente clicos leves, presin en la pelvis o dolor persistente en el abdomen.  Tiene nuseas o vmitos persistentes.  Observa una   secrecin vaginal con mal olor.  Siente dolor al orinar. SOLICITE ATENCIN MDICA DE INMEDIATO SI:   Tiene fiebre.  Pierde lquido o sangre por la vagina.  Tiene sangrado o pequeas prdidas vaginales.  Siente dolor intenso o clicos en el abdomen.  Sube o baja de peso rpidamente.  Le falta el aire y le duele el pecho al respirar.  Sbitamente se le hincha el rostro, las manos, los tobillos, los pies o las piernas de manera extrema.  No ha sentido los movimientos del beb durante una hora.  Siente un dolor de cabeza intenso que no se alivia con medicamentos.  Su visin se modifica. Document Released: 12/17/2004 Document Revised: 11/09/2012 [employer] Patient Information 2014 [employer].  

## 2013-05-17 NOTE — MAU Provider Note (Signed)
Chief Complaint:  Rupture of Membranes and Labor Eval   Paula Villegas is a 38 y.o.  [phone] with IUP at [redacted]w[redacted]d presenting for Rupture of Membranes and Labor Eval  Pt presented [facility] the [facility] department for possible ROM. States she has had a more watery discharge over the last day that seems to come and go.  Was seen this AM and had a positive nitrazine but neg pool and neg fern.  States has been contracting for the last several weeks but since Saturday they have gotten stronger but still irregular.  No VB. +FM.     Menstrual History: OB History   Grav Para Term Preterm Abortions TAB SAB Ect Mult Living   5 3 2 1 1  1   3        Patient's last menstrual period was 08/06/2012.      Past Medical History  Diagnosis Date  . No pertinent past medical history   . Preterm labor     PTL x2, PTD x1  . Ovarian cyst     Past Surgical History  Procedure Laterality Date  . Cholecystectomy    . Laparoscopic appendectomy  05/22/2011    Procedure: APPENDECTOMY LAPAROSCOPIC;  Surgeon: Velora Heckler, MD;  Location: [facility] OR;  Service: General;  Laterality: N/A;  . Appendectomy    . Cholecystectomy N/A     Family History  Problem Relation Age of Onset  . Hypertension Mother   . Cancer Mother     reproductive  . Alcohol abuse Mother   . Hearing loss Neg Hx   . Asthma Brother     History  Substance Use Topics  . Smoking status: Never Smoker   . Smokeless tobacco: Never Used  . Alcohol Use: Yes     Comment: not while pregnant     No Known Allergies  Prescriptions prior to admission  Medication Sig Dispense Refill  . Prenatal Vit-Fe Fumarate-FA (PRENATAL MULTIVITAMIN) TABS tablet Take 1 tablet by mouth daily at 12 noon.        Review of Systems - Negative except for what is mentioned in HPI.  Physical Exam  Blood pressure 118/76, pulse 75, resp. rate 18, height 5' 2.5" (1.588 m), weight 70.852 kg (156 lb 3.2 oz), last menstrual period 08/06/2012, SpO2 99.00%. GENERAL:  Well-developed, well-nourished female in no acute distress.  LUNGS: Clear to auscultation bilaterally.  HEART: Regular rate and rhythm. ABDOMEN: Soft, nontender, nondistended, gravid.  EXTREMITIES: Nontender, no edema, 2+ distal pulses. Cervical Exam: Dilatation 3cm   Effacement 60%   Station -2   Presentation: cephalic FHT:  Baseline rate 120 bpm   Variability moderate  Accelerations present   Decelerations none Contractions: 2-5 min Labs: Results for orders placed during the hospital encounter of 05/17/13 (from the past 24 hour(s))  AMNISURE RUPTURE OF MEMBRANE (ROM)   Collection Time    05/17/13  1:47 PM      Result Value Ref Range   Amnisure ROM NEGATIVE      Imaging Studies:  No results found.  Assessment: Paula Villegas is  38 y.o. [phone] at [redacted]w[redacted]d presents with Rupture of Membranes and Labor Eval .  Plan:  - speculum exam negative but + nitrazine in [facility] - amnisure today negative - reassurancge given that exam now neg x 3 with neg amnisure - cervix unchanged from >1 week ago - labor precautions discussed - f/u in clinic as scheduled  D/c to home FWB- cat I tracing.    Paula Villegas,  Paula Villegas 2/25/20153:19 PM

## 2013-05-17 NOTE — MAU Note (Signed)
Patient states she started leaking clear fluid since yesterday at 0900. States she continues to leak and wet mini pads. [facility] and sent to MAU for further evaluation. States contractions every 5-10 minutes. Reports good fetal movement.

## 2013-05-18 ENCOUNTER — Encounter (HOSPITAL_COMMUNITY): Payer: Self-pay | Admitting: *Deleted

## 2013-05-18 ENCOUNTER — Inpatient Hospital Stay (HOSPITAL_COMMUNITY)
Admission: AD | Admit: 2013-05-18 | Discharge: 2013-05-19 | DRG: 775 | Disposition: A | Discharge status: Home / Self Care | Payer: Medicaid Other | Source: Ambulatory Visit | Attending: Obstetrics & Gynecology | Admitting: Obstetrics & Gynecology

## 2013-05-18 DIAGNOSIS — O99892 Other specified diseases and conditions complicating childbirth: Secondary | ICD-10-CM | POA: Diagnosis present

## 2013-05-18 DIAGNOSIS — O9989 Other specified diseases and conditions complicating pregnancy, childbirth and the puerperium: Secondary | ICD-10-CM

## 2013-05-18 DIAGNOSIS — O09893 Supervision of other high risk pregnancies, third trimester: Secondary | ICD-10-CM

## 2013-05-18 DIAGNOSIS — O09523 Supervision of elderly multigravida, third trimester: Secondary | ICD-10-CM

## 2013-05-18 DIAGNOSIS — IMO0001 Reserved for inherently not codable concepts without codable children: Secondary | ICD-10-CM

## 2013-05-18 DIAGNOSIS — O09529 Supervision of elderly multigravida, unspecified trimester: Secondary | ICD-10-CM

## 2013-05-18 DIAGNOSIS — Z22330 Carrier of Group B streptococcus: Secondary | ICD-10-CM

## 2013-05-18 DIAGNOSIS — O09213 Supervision of pregnancy with history of pre-term labor, third trimester: Secondary | ICD-10-CM

## 2013-05-18 LAB — CBC
HCT: 41.1 % (ref 36.0–46.0)
Hemoglobin: 14.3 g/dL (ref 12.0–15.0)
MCH: 30.8 pg (ref 26.0–34.0)
MCHC: 34.8 g/dL (ref 30.0–36.0)
MCV: 88.4 fL (ref 78.0–100.0)
PLATELETS: 231 10*3/uL (ref 150–400)
RBC: 4.65 MIL/uL (ref 3.87–5.11)
RDW: 14.9 % (ref 11.5–15.5)
WBC: 8.3 10*3/uL (ref 4.0–10.5)

## 2013-05-18 LAB — RPR: RPR Ser Ql: NONREACTIVE

## 2013-05-18 MED ORDER — FENTANYL CITRATE 0.05 MG/ML IJ SOLN: 100.0000 ug | INTRAMUSCULAR | Status: DC | PRN

## 2013-05-18 MED ORDER — SODIUM CHLORIDE 0.9 % IV SOLN: 2.0000 g | Freq: Once | INTRAVENOUS | Status: DC

## 2013-05-18 MED ORDER — ONDANSETRON HCL 4 MG/2ML IJ SOLN: 4.0000 mg | INTRAMUSCULAR | Status: DC | PRN

## 2013-05-18 MED ORDER — OXYTOCIN BOLUS FROM INFUSION
500.0000 mL | INTRAVENOUS | Status: DC
Start: 2013-05-18 — End: 2013-05-18

## 2013-05-18 MED ORDER — ACETAMINOPHEN 325 MG PO TABS: 650.0000 mg | ORAL_TABLET | ORAL | Status: DC | PRN

## 2013-05-18 MED ORDER — LANOLIN HYDROUS EX OINT: TOPICAL_OINTMENT | CUTANEOUS | Status: DC | PRN

## 2013-05-18 MED ORDER — CITRIC ACID-SODIUM CITRATE 334-500 MG/5ML PO SOLN: 30.0000 mL | ORAL | Status: DC | PRN

## 2013-05-18 MED ORDER — DIPHENHYDRAMINE HCL 25 MG PO CAPS: 25.0000 mg | ORAL_CAPSULE | Freq: Four times a day (QID) | ORAL | Status: DC | PRN

## 2013-05-18 MED ORDER — SIMETHICONE 80 MG PO CHEW: 80.0000 mg | CHEWABLE_TABLET | ORAL | Status: DC | PRN

## 2013-05-18 MED ORDER — ZOLPIDEM TARTRATE 5 MG PO TABS: 5.0000 mg | ORAL_TABLET | Freq: Every evening | ORAL | Status: DC | PRN

## 2013-05-18 MED ORDER — BENZOCAINE-MENTHOL 20-0.5 % EX AERO
1.0000 "application " | INHALATION_SPRAY | CUTANEOUS | Status: DC | PRN
  Filled 2013-05-18: qty 56

## 2013-05-18 MED ORDER — TETANUS-DIPHTH-ACELL PERTUSSIS 5-2.5-18.5 LF-MCG/0.5 IM SUSP
0.5000 mL | Freq: Once | INTRAMUSCULAR | Status: DC
Start: 2013-05-19 — End: 2013-05-19

## 2013-05-18 MED ORDER — OXYCODONE-ACETAMINOPHEN 5-325 MG PO TABS: 1.0000 | ORAL_TABLET | ORAL | Status: DC | PRN

## 2013-05-18 MED ORDER — ONDANSETRON HCL 4 MG/2ML IJ SOLN: 4.0000 mg | Freq: Four times a day (QID) | INTRAMUSCULAR | Status: DC | PRN

## 2013-05-18 MED ORDER — SODIUM CHLORIDE 0.9 % IV SOLN
1.0000 g | INTRAVENOUS | Status: DC
  Administered 2013-05-18: 1 g via INTRAVENOUS
  Filled 2013-05-18 (×4): qty 1000

## 2013-05-18 MED ORDER — ONDANSETRON HCL 4 MG PO TABS: 4.0000 mg | ORAL_TABLET | ORAL | Status: DC | PRN

## 2013-05-18 MED ORDER — SENNOSIDES-DOCUSATE SODIUM 8.6-50 MG PO TABS
2.0000 | ORAL_TABLET | ORAL | Status: DC
  Administered 2013-05-18: 2 via ORAL
  Filled 2013-05-18: qty 2

## 2013-05-18 MED ORDER — WITCH HAZEL-GLYCERIN EX PADS: 1.0000 "application " | MEDICATED_PAD | CUTANEOUS | Status: DC | PRN

## 2013-05-18 MED ORDER — IBUPROFEN 600 MG PO TABS: 600.0000 mg | ORAL_TABLET | Freq: Four times a day (QID) | ORAL | Status: DC | PRN

## 2013-05-18 MED ORDER — IBUPROFEN 600 MG PO TABS
600.0000 mg | ORAL_TABLET | Freq: Four times a day (QID) | ORAL | Status: DC
  Administered 2013-05-18 – 2013-05-19 (×4): 600 mg via ORAL
  Filled 2013-05-18 (×4): qty 1

## 2013-05-18 MED ORDER — PRENATAL MULTIVITAMIN CH
1.0000 | ORAL_TABLET | Freq: Every day | ORAL | Status: DC
  Administered 2013-05-19: 1 via ORAL
  Filled 2013-05-18: qty 1

## 2013-05-18 MED ORDER — DIBUCAINE 1 % RE OINT: 1.0000 "application " | TOPICAL_OINTMENT | RECTAL | Status: DC | PRN

## 2013-05-18 MED ORDER — LACTATED RINGERS IV SOLN: 500.0000 mL | INTRAVENOUS | Status: DC | PRN

## 2013-05-18 MED ORDER — LIDOCAINE HCL (PF) 1 % IJ SOLN
30.0000 mL | INTRAMUSCULAR | Status: DC | PRN
  Administered 2013-05-18: 30 mL via SUBCUTANEOUS
  Filled 2013-05-18: qty 30

## 2013-05-18 MED ORDER — OXYTOCIN 40 UNITS IN LACTATED RINGERS INFUSION - SIMPLE MED
62.5000 mL/h | INTRAVENOUS | Status: DC
  Administered 2013-05-18: 62.5 mL/h via INTRAVENOUS
  Filled 2013-05-18: qty 1000

## 2013-05-18 MED ORDER — LACTATED RINGERS IV SOLN: INTRAVENOUS | Status: DC

## 2013-05-18 NOTE — H&P (Signed)
Paula Villegas is a 38 y.o. female [phone] with IUP at 5955w4d by US presenting for active labor. Pt states she has been having regular, every 3 minutes contractions, associated with none vaginal bleeding.  Membranes are intact, with active fetal movement.    [facility]   Prenatal History/Complications: AMA, GBS+  Past Medical History: Past Medical History  Diagnosis Date  . No pertinent past medical history   . Preterm labor     PTL x2, PTD x1  . Ovarian cyst     Past Surgical History: Past Surgical History  Procedure Laterality Date  . Cholecystectomy    . Laparoscopic appendectomy  05/22/2011    Procedure: APPENDECTOMY LAPAROSCOPIC;  Surgeon: Velora Hecklerodd M Gerkin, MD;  [facility];  Service: General;  Laterality: N/A;  . Appendectomy    . Cholecystectomy N/A     Obstetrical History: OB History   Grav Para Term Preterm Abortions TAB SAB Ect Mult Living   5 3 2 1 1  1   3      Social History: History   Social History  . Marital Status: Single    Spouse Name: N/A    Number of Children: N/A  . Years of Education: N/A   Social History Main Topics  . Smoking status: Never Smoker   . Smokeless tobacco: Never Used  . Alcohol Use: Yes     Comment: not while pregnant  . Drug Use: No  . Sexual Activity: Yes    Birth Control/ Protection: None     Comment: last sex three days ago   Other Topics Concern  . None   Social History Narrative  . None    Family History: Family History  Problem Relation Age of Onset  . Hypertension Mother   . Cancer Mother     reproductive  . Alcohol abuse Mother   . Hearing loss Neg Hx   . Asthma Brother     Allergies: No Known Allergies  Prescriptions prior to admission  Medication Sig Dispense Refill  . Prenatal Vit-Fe Fumarate-FA (PRENATAL MULTIVITAMIN) TABS tablet Take 1 tablet by mouth daily at 12 noon.        Review of Systems: Negative unless otherwise stated in History above  Physicial Blood pressure  142/82, pulse 61, temperature 98.2 F (36.8 C), temperature source Oral, resp. rate 22, last menstrual period 08/06/2012. General appearance: alert, cooperative and mild distress Lungs: clear to auscultation bilaterally Heart: regular rate and rhythm Abdomen: soft, non-tendner, Gravid Presentation: cephalic Fetal monitoringBaseline: 115 bpm, Variability: Good {> 6 bpm), Accelerations: Reactive and Decelerations: Absent Uterine activityFrequency: Every 3 minutes Dilation: 7 Effacement (%): 100 Station: +1 Exam by:: jolynn  Prenatal labs: ABO, Rh: --/--/O POS (01/18 1634) Antibody: NEG (01/18 1634) Rubella:   RPR: Nonreactive (12/22 0000)  HBsAg:    HIV: Non-reactive (08/21 0000)  GBS: Positive (01/08 0000)  GTT: wnl  Prenatal Transfer Tool  Maternal Diabetes: No Genetic Screening: Declined Maternal Ultrasounds/Referrals: Normal Fetal Ultrasounds or other Referrals:  None Maternal Substance Abuse:  No Significant Maternal Medications:  None Significant Maternal Lab Results: Lab values include: Group B Strep positive  No results found for this or any previous visit (from the past 24 hour(s)).  Assessment: Paula Villegas is a 38 y.o. [phone] at 4855w4d by here for active labor  #Labor: expectant management of imminent delivery   #Pain: No IV meds due to imminent delivery   #FWB: Cat 1 #ID:  GBS +; Amp  #Feeding:  Breast #MOC:unknown  Wenda Low MD [facility] PGY-1 05/18/2013, 2:21 PM   I spoke with and examined patient and agree with resident's note and plan of care.  Tawana Scale, MD OB Fellow 05/18/2013 5:45 PM

## 2013-05-19 MED ORDER — NORETHINDRONE 0.35 MG PO TABS: 1.0000 | ORAL_TABLET | Freq: Every day | ORAL | Status: DC

## 2013-05-19 MED ORDER — IBUPROFEN 600 MG PO TABS: 600.0000 mg | ORAL_TABLET | Freq: Four times a day (QID) | ORAL | Status: DC

## 2013-05-19 NOTE — Discharge Instructions (Signed)

## 2013-05-19 NOTE — Discharge Summary (Signed)
Obstetric Discharge Summary Reason for Admission: onset of labor Prenatal Procedures: none Intrapartum Procedures: spontaneous vaginal delivery Postpartum Procedures: none Complications-Operative and Postpartum: 2nd degree perineal laceration Hemoglobin  Date Value Ref Range Status  05/18/2013 14.3  12.0 - 15.0 g/dL Final  29/56/213012/22/2014 86.512.6   Final     HCT  Date Value Ref Range Status  05/18/2013 41.1  36.0 - 46.0 % Final  03/13/2013 38   Final   Hospital Course: GBS+ and did not receive adequate coverage (ampicillin given less than 1 hr before delivery). SVD 05/18/13 at 1443, girl. She is breastfeeding and desires the copper IUD. Due to inadequate GBS coverage, Mom will be discharged at 10pm tonight and will room-in with baby until discharged by pediatrician. Follow up with [facility] as needed.  Physical Exam:  General: alert, cooperative and no distress Lochia: appropriate Uterine Fundus: firm Incision: n/a DVT Evaluation: No evidence of DVT seen on physical exam. No significant calf/ankle edema.  Discharge Diagnoses: Term Pregnancy-delivered  Discharge Information: Date: 05/19/2013 Activity: pelvic rest Diet: routine Medications: Ibuprofen and Colace Condition: stable Instructions: refer to practice specific booklet Discharge to: home Follow-up Information   [facility]   Contact information:   [address] [place] [id number] [phone]      Newborn Data: Live born female  Birth Weight: 8 lb 2.8 oz (3708 g) APGAR: 9, 9  Home with mother.  Duane Bostonuke, Angela N 05/19/2013, 7:49 AM  I have seen and examined this patient and agree the above assessment. CRESENZO-DISHMAN,Cheynne Virden 05/19/2013 10:16 AM

## 2013-05-19 NOTE — Progress Notes (Signed)
UR chart review completed.  

## 2013-05-20 ENCOUNTER — Ambulatory Visit: Payer: Self-pay

## 2013-05-20 NOTE — Lactation Note (Signed)
This note was copied from the chart of Paula Villegas. Lactation Consultation Note Mom states breastfeeding is going well and denies problems or questions.  Encouraged to call with concerns.  Patient Name: Paula Villegas ZOXWR'UToday's Date: 05/20/2013     Maternal Data    Feeding    LATCH Score/Interventions                      Lactation Tools Discussed/Used     Consult Status      Hansel Feinsteinowell, [name] Ann 05/20/2013, 2:41 PM

## 2014-01-22 ENCOUNTER — Encounter (HOSPITAL_COMMUNITY): Payer: Self-pay | Admitting: *Deleted

## 2015-03-25 ENCOUNTER — Emergency Department (HOSPITAL_COMMUNITY)
Admission: EM | Admit: 2015-03-25 | Discharge: 2015-03-25 | Disposition: A | Discharge status: Home / Self Care | Payer: No Typology Code available for payment source | Source: Home / Self Care | Attending: Family Medicine | Admitting: Family Medicine

## 2015-03-25 ENCOUNTER — Encounter (HOSPITAL_COMMUNITY): Payer: Self-pay

## 2015-03-25 DIAGNOSIS — J9801 Acute bronchospasm: Secondary | ICD-10-CM

## 2015-03-25 DIAGNOSIS — J069 Acute upper respiratory infection, unspecified: Secondary | ICD-10-CM

## 2015-03-25 MED ORDER — IPRATROPIUM-ALBUTEROL 0.5-2.5 (3) MG/3ML IN SOLN
RESPIRATORY_TRACT | Status: AC
Start: 2015-03-25 — End: 2015-03-25
  Filled 2015-03-25: qty 3

## 2015-03-25 MED ORDER — PREDNISONE 20 MG PO TABS: ORAL_TABLET | ORAL | Status: DC

## 2015-03-25 MED ORDER — TRIAMCINOLONE ACETONIDE 40 MG/ML IJ SUSP
INTRAMUSCULAR | Status: AC
  Filled 2015-03-25: qty 1

## 2015-03-25 MED ORDER — TRIAMCINOLONE ACETONIDE 40 MG/ML IJ SUSP
40.0000 mg | Freq: Once | INTRAMUSCULAR | Status: AC
  Administered 2015-03-25: 40 mg via INTRAMUSCULAR

## 2015-03-25 MED ORDER — ALBUTEROL SULFATE HFA 108 (90 BASE) MCG/ACT IN AERS: 2.0000 | INHALATION_SPRAY | RESPIRATORY_TRACT | Status: DC | PRN

## 2015-03-25 MED ORDER — IPRATROPIUM-ALBUTEROL 0.5-2.5 (3) MG/3ML IN SOLN
3.0000 mL | Freq: Once | RESPIRATORY_TRACT | Status: AC
  Administered 2015-03-25: 3 mL via RESPIRATORY_TRACT

## 2015-03-25 NOTE — ED Notes (Signed)
Patient here for cough congestion for past two weeks Chest discomfort from so much coughing Patient has been using OTC mucinex and dayquil with little relief

## 2015-03-25 NOTE — Discharge Instructions (Signed)
Broncoespasmo - Adultos (Bronchospasm, Adult) Broncoespasmo significa que hay un espasmo o restriccin de las vas areas que llevan el aire a los pulmones. Durante el broncoespasmo, la respiracin se hace ms difcil debido a que las vas respiratorias se contraen. Cuando esto ocurre, puede haber tos, un silbido al respirar (sibilancias) presin en el pecho y dificultad para respirar. Generalmente se asocia al asma, pero no todos los pacientes que experimentan broncoespasmos sufren asma. CAUSAS  La causa del broncoespasmo es la inflamacin o la irritacin de las vas respiratorias. La inflamacin o la irritacin pueden haber sido desencadenadas por:   Set designer (por ejemplo a animales, polen, alimentos y moho). Los alrgenos que causan el broncoespasmo pueden producir sibilancias inmediatamente despus de la exposicin, o varias horas despus.   Infeccin. Se considera que la causa ms frecuente son las infecciones virales.   Ejercicios.   Irritantes (como la polucin, humo de cigarrillos, olores fuertes, Nature conservation officer y vapores de [place]).   Los cambios climticos. El viento aumenta la cantidad de moho y polen del aire. La lluvia limpia el aire arrastrando las sustancias irritantes. El aire fro puede causar inflamacin.   Estrs y [employer].  [place].   Tos excesiva durante la noche.   Tos frecuente o intensa durante un resfro comn.   Opresin en el pecho.   Falta de aire.  DIAGNSTICO  El broncoespasmo se diagnostica con la historia clnica y un examen fsico. En algunos casos se indican otros estudios, como radiografas de trax, para Clinical research associate. TRATAMIENTO   Le indicarn medicamentos por va inhalatoria para abrir las vas respiratorias y ayudarlo a Ambulance person. Los medicamentos se administran por medio de Educational psychologist o Furniture conservator/restorer.  Le administrarn corticoides en los casos de broncoespasmo grave, generalmente  cuando se asocia al asma. INSTRUCCIONES PARA EL CUIDADO EN EL HOGAR   Siempre tenga un plan preparado para pedir asistencia mdica. Sepa cuando debe llamar al mdico y a los servicios de emergencia de su localidad (911 en [place]). Sepa donde puede acceder a un servicio de emergencias.  Tome todos los medicamentos como le indic el mdico.  Si le indicaron el uso de un inhalador o nebulizador, consulte a su mdico para que le explique cmo usarlo correctamente. Siempre use un espaciador con el inhalador, si le indicaron su uso.  Es Manufacturing systems engineer muy tranquilo durante el ataque. Trate de relajarse y respirar ms lentamente.  Controle el ambiente del hogar del siguiente modo:  Cambie el filtro de la calefaccin y del aire acondicionado al menos una vez al mes.   Limite el uso de hogares o estufas a lea.  No fume y nopermita que fumen en su hogar.   Evite la exposicin a perfumes y fragancias.   Elimine las plagas (como cucarachas, ratones) y sus excrementos.   Elimine las plantas si observa moho en ellas.   Mantenga su casa limpia y [place].   Reemplace las alfombras por pisos de [place], baldosas o vinilo. Las alfombras pueden retener las escamas o pelos de los animales y [place].   Use almohadas, mantas y cubre colchones antialrgicos.   [place] sbanas y las mantas todas las semanas con agua caliente y squelas con aire caliente.   Use mantas de [place] o algodn.   Lvese las manos con frecuencia. SOLICITE ATENCIN MDICA SI:   Tiene dolores musculares.   Siente dolor en el pecho.   El esputo cambia de un color claro o blanco  a un color amarillo, verde, gris o sanguinolento.   El esputo que elimina es ms espeso.   Tiene problemas relacionados con el medicamento que recibe. como urticaria, picazn, hinchazn o dificultades respiratorias.  SOLICITE ATENCIN MDICA DE INMEDIATO SI:   Empeoran las sibilancias y la tos, an despus de tomar  los medicamentos recetados.   Tiene una creciente dificultad para respirar.   Siente un dolor intenso en el pecho. ASEGRESE DE QUE:   Comprende estas instrucciones.  Controlar su afeccin.  Recibir ayuda de inmediato si no mejora o si empeora.   Esta informacin no tiene Theme park manager el consejo del mdico. Asegrese de hacerle al mdico cualquier pregunta que tenga.   Document Released: 06/16/2007 Document Revised: 03/30/2014 Elsevier Interactive Patient Education 2016 [employer].  Cmo usar un Armed forces operational officer (How to Use an Inhaler) Es muy importante que sepa usar su Water engineer. Una buena tcnica garantizar que el medicamento llegue a los pulmones.  CMO USAR UN INHALADOR:  Retire la tapa del Armed forces operational officer.  Si esta es la primera vez que [place] el [place], debe prepararlo. Sacuda el inhalador durante 5segundos. Libere cuatro descargas en el aire, lejos del rostro. Si tiene preguntas, pdale ayuda al mdico.  Sacuda el inhalador durante 5segundos.  Gire el inhalador de modo que la botella quede por encima de la boquilla.  Coloque el dedo ndice por encima de la botella. El pulgar debe sujetar la parte inferior del inhalador.  Abra la boca.  Sostenga el inhalador lejos de la boca (un ancho de 2 dedos) o coloque los labios alrededor de la boquilla. Pregntele al mdico de qu manera debe usar Therapist, nutritional.  Exhale la mayor cantidad de aire que pueda.  Inhale y presione la botella hacia abajo 1vez para liberar el medicamento. Sentir cmo el medicamento ingresa a la boca y Administrator.  Siga inspirando profundamente, muy despacio. Trate de llenar los pulmones.  Despus de que haya inspirado profundamente, contenga la respiracin durante 10segundos. Esto ayudar a que el medicamento se asiente en los pulmones. Si no puede contener la respiracin durante 10segundos, contngala cuanto ms pueda antes de exhalar.  Exhale lentamente a travs de los labios  fruncidos. Ponga los labios como cuando silba.  Si el mdico le ha indicado que debe aspirar ms de 1 descarga, espere de 15 a 30segundos como mnimo entre Presenter, broadcasting. Esto ayudar a que [employer] del [place]. No use el inhalador ms veces de las que el [employer].  Vuelva a Financial trader.  Siga las indicaciones del mdico o las instrucciones que vienen en la caja para Building control surveyor. Si [place] ms de Advertising account executive, pregntele al mdico qu inhaladores debe usar y en qu orden. Pdale al mdico que lo ayude a determinar cundo Higher education careers adviser a Field seismologist.  Si [place] un inhalador con corticoides, enjuguese siempre la boca con agua despus de la ltima descarga, hgase grgaras y escupa el agua. No trague el agua. SOLICITE AYUDA SI:  El medicamento del Armed forces operational officer solo lo ayuda parcialmente a TEFL teacher las sibilancias y las dificultades para Industrial/product designer.  Tiene dificultad para Academic librarian.  Experimenta un leve aumento de la expectoracin espesa (flema). SOLICITE AYUDA DE INMEDIATO SI:  El medicamento del inhalador no ayuda a TEFL teacher las sibilancias o la dificultad para respirar, o bien, si siente opresin en el pecho.  Siente mareos, dolor de cabeza o una frecuencia cardaca acelerada.  Siente escalofros, fiebre o sudores  nocturnos.  Experimenta un aumento considerable de la expectoracin espesa o si observa sangre en dicha expectoracin espesa. ASEGRESE DE QUE:   Comprende estas instrucciones.  Controlar su afeccin.  Recibir ayuda de inmediato si no mejora o si empeora.   Esta informacin no tiene Theme park managercomo fin reemplazar el consejo del mdico. Asegrese de hacerle al mdico cualquier pregunta que tenga.   Document Released: 04/11/2010 Document Revised: 12/28/2012 Elsevier Interactive Patient Education 2016 [employer]  Infeccin del tracto respiratorio superior, adultos (Upper Respiratory Infection, Adult) For  drainage recommend taking Allegra or Claritin or Zyrtec daily For congestion take Sudafed PE 10 mg every 4-6 hours as needed Use saline nasal spray frequently May take Tylenol every 4 hours for discomfort or human take ibuprofen 60 mg every 6-8 hours as needed for headache and discomfort. La mayora de las infecciones del tracto respiratorio superior son infecciones virales de las vas que llevan el aire a los pulmones. Un infeccin del tracto respiratorio superior afecta la nariz, la garganta y las vas respiratorias superiores. El tipo ms frecuente de infeccin del tracto respiratorio superior es la nasofaringitis, que habitualmente se conoce como "resfro comn". Las infecciones del tracto respiratorio superior siguen su curso y por lo general se curan solas. En la International Business Machinesmayora de los casos, la infeccin del tracto respiratorio superior no requiere atencin [place]mdica, Biomedical engineerpero a veces, despus de una infeccin viral, puede surgir una infeccin bacteriana en las vas respiratorias superiores. Esto se conoce como infeccin secundaria. Las infecciones sinusales y en el odo medio son tipos frecuentes de infecciones secundarias en el tracto respiratorio superior. La neumona bacteriana tambin puede complicar un cuadro de infeccin del tracto respiratorio superior. Este tipo de infeccin puede empeorar el asma y la enfermedad pulmonar obstructiva crnica (EPOC). En algunos casos, estas complicaciones pueden requerir atencin mdica de emergencia y poner en peligro la vida.  CAUSAS Casi todas las infecciones del tracto respiratorio superior se deben a los virus. Un virus es un tipo de microbio que puede contagiarse de Neomia Dearuna persona a Educational psychologistotra.  FACTORES DE RIESGO Puede estar en riesgo de sufrir una infeccin del tracto respiratorio superior si:   Fuma.  Tiene una enfermedad pulmonar o cardaca crnica.  Tiene debilitado el sistema de defensa (inmunitario) del cuerpo.  Es [address] [place] Entrancemuy joven o de edad muy Wrightavanzada.  Tiene  asma o alergias nasales.  Trabaja en reas donde hay mucha gente o poca ventilacin.  Rudi Cocorabaja en una escuela o en un centro de atencin mdica. SIGNOS Y SNTOMAS  Habitualmente, los sntomas aparecen de 2a 3das despus de entrar en contacto con el virus del resfro. La mayora de las infecciones virales en el tracto respiratorio superior duran de 7a 10das. Sin embargo, las infecciones virales en el tracto respiratorio superior a causa del virus de la gripe pueden durar de 14a 18das y, habitualmente, son ms graves. Entre los sntomas se pueden incluir los siguientes:   Secrecin o congestin nasal.  Estornudos.  Tos.  Dolor de Advertising copywritergarganta.  Dolor de Turk[place]istancabeza.  Fatiga.  Grant RutsFiebre.  Prdida del apetito.  Dolor en la frente, detrs de los ojos y por encima de los pmulos (dolor sinusal).  Dolores musculares. DIAGNSTICO  El mdico puede diagnosticar una infeccin del tracto respiratorio superior mediante los siguientes estudios:  Examen fsico.  Pruebas para verificar si los sntomas no se deben a otra afeccin, por ejemplo:  Faringitis estreptoccica.  Sinusitis.  Neumona.  Asma. TRATAMIENTO  Esta infeccin desaparece sola, con el tiempo. No puede curarse con medicamentos,  pero a menudo se prescriben para aliviar los sntomas. Los medicamentos pueden ser tiles para lo siguiente:  Personal assistant fiebre.  Reducir la tos.  Aliviar la congestin nasal. INSTRUCCIONES PARA EL CUIDADO EN EL HOGAR   Tome los medicamentos solamente como se lo haya indicado el mdico.  A fin de Engineer, materials de garganta, haga grgaras con solucin salina templada o consuma caramelos para la tos, como se lo haya indicado el mdico.  Use un humidificador de vapor clido o inhale el vapor de la ducha para aumentar la humedad del aire. Esto facilitar la respiracin.  Beba suficiente lquido para Photographer orina clara o de color amarillo plido.  Consuma sopas y otros caldos  transparentes, y [employer].  Descanse todo lo que sea necesario.  Regrese al Aleen Campi cuando la temperatura se le haya normalizado o cuando el mdico lo autorice. Es posible que deba quedarse en su casa durante un tiempo prolongado, para no infectar a los dems. Tambin puede usar un barbijo y lavarse las manos con cuidado para Transport planner propagacin del virus.  Aumente el uso del inhalador si tiene asma.  No consuma ningn producto que contenga tabaco, lo que incluye cigarrillos, tabaco de Theatre manager o Administrator, Civil Service. Si necesita ayuda para dejar de fumar, consulte al [employer]. PREVENCIN  La mejor manera de protegerse de un resfro es mantener una higiene [place].   Evite el contacto oral o fsico con personas que tengan sntomas de resfro.  En caso de contacto, lvese las manos con frecuencia. No hay pruebas claras de que la vitaminaC, la vitaminaE, la equincea o el ejercicio reduzcan la probabilidad de Primary school teacher un resfro. Sin embargo, siempre se recomienda Insurance account manager, hacer ejercicio y Engineering geologist.  SOLICITE ATENCIN MDICA SI:   Su estado empeora en lugar de mejorar.  Los medicamentos no Estate agent.  Tiene escalofros.  La sensacin de falta de aire empeora.  Tiene mucosidad marrn o roja.  Tiene secrecin nasal amarilla o marrn.  Le duele la cara, especialmente al inclinarse hacia adelante.  Tiene fiebre.  Tiene los ganglios del cuello hinchados.  Siente dolor al tragar.  Tiene zonas blancas en la parte de atrs de la garganta. SOLICITE ATENCIN MDICA DE INMEDIATO SI:   Tiene sntomas intensos o persistentes de:  Dolor de [place].  Dolor de odos.  Dolor sinusal.  Dolor en el pecho.  Tiene enfermedad pulmonar crnica y cualquiera de estos sntomas:  Sibilancias.  Tos prolongada.  Tos con sangre.  Cambio en la mucosidad habitual.  Presenta rigidez en el cuello.  Tiene cambios en:  La visin.  La  audicin.  El pensamiento.  [place] de nimo. ASEGRESE DE QUE:   Comprende estas instrucciones.  Controlar su afeccin.  Recibir ayuda de inmediato si no mejora o si empeora.   Esta informacin no tiene Theme park manager el consejo del mdico. Asegrese de hacerle al mdico cualquier pregunta que tenga.   Document Released: 12/17/2004 Document Revised: 07/24/2014 Elsevier Interactive Patient Education [employer]

## 2015-03-25 NOTE — ED Provider Notes (Signed)
CSN: [id number]     Arrival date & time 03/25/15  1813 History   First MD Initiated Contact with Patient 03/25/15 2045     Chief Complaint  Patient presents with  . URI   (Consider location/radiation/quality/duration/timing/severity/associated sxs/prior Treatment) HPI Comments: 40 year old Hispanic female complaining of a cough for 2 weeks. In addition she has had a runny nose, stuffy nose and upper mid anterior chest pain that is constant but worse with coughing. She complains of a headache and low back pain. No history of asthma or smoking. She has tried Production designer, theatre/television/filmDayQuil and Mucinex.   Past Medical History  Diagnosis Date  . No pertinent past medical history   . Preterm labor     PTL x2, PTD x1  . Ovarian cyst    Past Surgical History  Procedure Laterality Date  . Cholecystectomy    . Laparoscopic appendectomy  05/22/2011    Procedure: APPENDECTOMY LAPAROSCOPIC;  Surgeon: Velora Hecklerodd M Gerkin, MD;  [facility];  Service: General;  Laterality: N/A;  . Appendectomy    . Cholecystectomy N/A    Family History  Problem Relation Age of Onset  . Hypertension Mother   . Cancer Mother     reproductive  . Alcohol abuse Mother   . Hearing loss Neg Hx   . Asthma Brother    Social History  Substance Use Topics  . Smoking status: Never Smoker   . Smokeless tobacco: Never Used  . Alcohol Use: Yes     Comment: not while pregnant   OB History    Gravida Para Term Preterm AB TAB SAB Ectopic Multiple Living   5 4 3 1 1  1   4      Review of Systems  Constitutional: Positive for activity change and appetite change. Negative for fever, chills and fatigue.  HENT: Positive for congestion, postnasal drip, rhinorrhea and sore throat. Negative for ear pain and facial swelling.   Eyes: Negative.   Respiratory: Positive for cough and chest tightness.   Cardiovascular: Negative.   Gastrointestinal: Negative.   Musculoskeletal: Negative for neck pain and neck stiffness.  Skin: Negative for pallor and  rash.  Neurological: Negative.     Allergies  Review of patient's allergies indicates no known allergies.  Home Medications   Prior to Admission medications   Medication Sig Start Date End Date Taking? Authorizing Provider  albuterol (PROVENTIL HFA;VENTOLIN HFA) 108 (90 Base) MCG/ACT inhaler Inhale 2 puffs into the lungs every 4 (four) hours as needed for wheezing or shortness of breath. 03/25/15   Hayden Rasmussenavid Zhanae Proffit, NP  ibuprofen (ADVIL,MOTRIN) 600 MG tablet Take 1 tablet (600 mg total) by mouth every 6 (six) hours. 05/19/13   Jamal CollinJames R Joyner, MD  norethindrone (ORTHO MICRONOR) 0.35 MG tablet Take 1 tablet (0.35 mg total) by mouth daily. 05/19/13   Jacklyn ShellFrances Cresenzo-Dishmon, CNM  predniSONE (DELTASONE) 20 MG tablet Take 3 tabs po on first day, 2 tabs second day, 2 tabs third day, 1 tab fourth day, 1 tab 5th day. Take with food. 03/25/15   Hayden Rasmussenavid Phyillis Dascoli, NP  Prenatal Vit-Fe Fumarate-FA (PRENATAL MULTIVITAMIN) TABS tablet Take 1 tablet by mouth daily at 12 noon.    Historical Provider, MD   Meds Ordered and Administered this Visit   Medications  ipratropium-albuterol (DUONEB) 0.5-2.5 (3) MG/3ML nebulizer solution 3 mL (3 mLs Nebulization Given 03/25/15 2102)  triamcinolone acetonide (KENALOG-40) injection 40 mg (40 mg Intramuscular Given 03/25/15 2102)    BP 122/60 mmHg  Pulse 64  Temp(Src) 98.2 F (  36.8 C) (Oral)  Resp 14  SpO2 100% No data found.   Physical Exam  Constitutional: She is oriented to person, place, and time. She appears well-developed and well-nourished. No distress.  HENT:  Mouth/Throat: No oropharyngeal exudate.  Bilateral TMs are normal Oropharynx with minor erythema and clear PND  Eyes: Conjunctivae and EOM are normal.  Neck: Normal range of motion. Neck supple.  Cardiovascular: Normal rate and regular rhythm.   Pulmonary/Chest: Effort normal. No respiratory distress. She has wheezes. She exhibits tenderness.  Bilateral expiratory wheezing. Taking a deep breath produces  coughing spasms. Prolonged expiratory phase.  Musculoskeletal: Normal range of motion. She exhibits no edema.  Lymphadenopathy:    She has no cervical adenopathy.  Neurological: She is alert and oriented to person, place, and time.  Skin: Skin is warm and dry. No rash noted.  Psychiatric: She has a normal mood and affect.  Nursing note and vitals reviewed.   ED Course  Procedures (including critical care time)  Labs Review Labs Reviewed - No data to display  Imaging Review No results found.   Visual Acuity Review  Right Eye Distance:   Left Eye Distance:   Bilateral Distance:    Right Eye Near:   Left Eye Near:    Bilateral Near:         MDM   1. URI (upper respiratory infection)   2. Cough due to bronchospasm    administered DuoNeb and Kenalog 40 mg IM. Post DuoNeb patient states she may be breathing a little better but is having some side effects. She is complaining of the feeling of hotness in her forehead, minor dizziness and headache. Mild improvement in air movement. Modest improvement and the diminishment of wheezes. Patient states she feels that she can go home. Her prescriptions have been E scribed to [facility]. She states she would not be able to pick them up tonight.  For drainage recommend taking Allegra or Claritin or Zyrtec daily For congestion take Sudafed PE 10 mg every 4-6 hours as needed Use saline nasal spray frequently May take Tylenol every 4 hours for discomfort or human take ibuprofen 60 mg every 6-8 hours as needed for headache and discomfort.    Hayden Rasmussen, NP 03/25/15 2135

## 2015-04-02 ENCOUNTER — Ambulatory Visit (INDEPENDENT_AMBULATORY_CARE_PROVIDER_SITE_OTHER): Payer: Self-pay | Admitting: Internal Medicine

## 2015-04-02 ENCOUNTER — Encounter: Payer: Self-pay | Admitting: Internal Medicine

## 2015-04-02 VITALS — BP 118/82 | HR 64 | Resp 16 | Ht 62.0 in | Wt 145.2 lb

## 2015-04-02 DIAGNOSIS — J069 Acute upper respiratory infection, unspecified: Secondary | ICD-10-CM

## 2015-04-02 DIAGNOSIS — J9801 Acute bronchospasm: Secondary | ICD-10-CM

## 2015-04-02 NOTE — Patient Instructions (Addendum)
Can google "advance directives, [place]"  And bring up form from Secretary of [place]. Print and fill out Or can go to "5 wishes"  Which is also in Spanish and fill out--this costs $5--perhaps easier to use. Designate a CytogeneticistMedical Power of Attorney to speak for you if you are unable to speak for yourself when ill or injured  Try Robitussin PE or Dayquil for daytime symptoms with drainage down throat. Call in one week if you are still having significant symptoms of mucous drainage and sinus pain.

## 2015-04-02 NOTE — Progress Notes (Signed)
   Subjective:    Patient ID: Paula Villegas, female    DOB: 04-19-1975, 40 y.o.   MRN: [id number]  HPI   [facility]/2015 for URI symptoms and cough for 2 weeks.  Diagnosed with bronchospasm and treated with short burst and taper of prednisone as well as Albuterol HFA as needed.  Last used inhaler this morning.   Still coughing a little bit, but feels much better than when seen on the 2nd.   No fever.  Sleeping well. Does feel like she has something stuck in her throat.  Nose is still stuffy at times.  Occasionally brings up light yellow mucous with cough.  These symptoms are better than previous as well.      Review of Systems     Objective:   Physical Exam  NAD HEENT:  PERRL, EOMI, TMs pearly gray, nasal mucosa with mild swelling and clear discharge, throat without injection or exudate. Neck:  Supple, no adenopathy or thyromegaly Chest:  CTA CV: RRR with normal S1 and S2, no S3, S4 or murmur appreciated.  Radial pulses normal and equal        Assessment & Plan:  1.  URI with Bronchospasm:  Pt. Appears to be doing much better.  Recommend otc cold remedy to treat residual symptoms.  To call if not improving in another week.  2.  Dental concerns:  Pt asks for dental referral as walking out--filling in right lower posterior molar fell out and would like attention to this.

## 2015-04-10 ENCOUNTER — Ambulatory Visit (INDEPENDENT_AMBULATORY_CARE_PROVIDER_SITE_OTHER): Payer: Self-pay | Admitting: Internal Medicine

## 2015-04-10 VITALS — BP 110/80 | HR 75 | Temp 98.8°F | Ht 62.0 in | Wt 143.0 lb

## 2015-04-10 DIAGNOSIS — B349 Viral infection, unspecified: Secondary | ICD-10-CM

## 2015-04-10 MED ORDER — AZITHROMYCIN 250 MG PO TABS: ORAL_TABLET | ORAL | Status: DC

## 2015-04-10 NOTE — Patient Instructions (Signed)
Get Dayquil for daytime use to control congestion, cough and take Nyquil during nighttime.  Keep dosing every 4-6 hours apart. Cool mist humidifier Drink lots of water Try to wait 2 days (Friday) to see if you feel better before getting the Zpack at the [address] (public health pharmacy

## 2015-04-10 NOTE — Progress Notes (Signed)
   Subjective:    Patient ID: Paula Villegas, female    DOB: 02/16/1976, 40 y.o.   MRN: [id number]  HPI   See note from 04/02/2015.  Continued with cough, especially with laughing or deep breathing, but felt she was getting better.   Yesterday, started worsening with cough, runny nose, pain in legs and anterior chest.  Eyes were red this morning and feel hot.  Lots of pressure around and behind eyes.  Nasal drainage is clear.  Feels short of breath again. Her almost 2 yo  daughter had a cough 3 days ago--seems to have resolved.  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  Pt. Not aware as to whether other children ill as well.   Has used MucinexDM without much improvement. Was using Robitussin DM the day before.  No improvement. No fever. Used her Ventolin HFA inhaler today 2 puffs without much improvement as well.  Meds: Albuterol HFA 2 puffs every 4 hours as needed Paragard IUD  No Known Allergies     Review of Systems     Objective:   Physical Exam HEENT: PERRL, EOMI, TMs pearly gray, throat without injection, Mildly tender over maxillary sinuses Neck:  Supple, no adenopathy Chest:  CTA CV:  RRR without murmur or rub Abd:  S, NT, No HSM or masses, +BS throughout.       Assessment & Plan:  Likely viral syndrome, though cannot rule out possibility of secdondary bacterial sinusitis. Z pack--to fill only if no improvement in next 48 hours OTC cold remedies (DAyquil/Nyquil) Push fluids Cool mist humidifier

## 2015-04-26 ENCOUNTER — Encounter: Payer: Self-pay | Admitting: Internal Medicine

## 2015-04-26 ENCOUNTER — Ambulatory Visit (INDEPENDENT_AMBULATORY_CARE_PROVIDER_SITE_OTHER): Payer: Self-pay | Admitting: Internal Medicine

## 2015-04-26 VITALS — BP 104/70 | HR 72 | Temp 98.1°F | Resp 20 | Ht 62.0 in | Wt 141.0 lb

## 2015-04-26 DIAGNOSIS — J3089 Other allergic rhinitis: Secondary | ICD-10-CM

## 2015-04-26 DIAGNOSIS — J069 Acute upper respiratory infection, unspecified: Secondary | ICD-10-CM

## 2015-04-26 MED ORDER — FEXOFENADINE HCL 180 MG PO TABS: 180.0000 mg | ORAL_TABLET | Freq: Every day | ORAL | Status: DC

## 2015-04-26 MED ORDER — FLUTICASONE PROPIONATE 50 MCG/ACT NA SUSP: 2.0000 | Freq: Every day | NASAL | Status: DC

## 2015-04-26 NOTE — Patient Instructions (Addendum)
Drink lots of fluids Cool mist humidifier in room where you sleep at night  Flonase:  Over the counter:  2 sprays each nostril daily Fexofenadine 180 mg (same as Allegra) 1 tab daily  Call if no better by Monday

## 2015-04-26 NOTE — Progress Notes (Signed)
   Subjective:    Patient ID: Paula Villegas, female    DOB: April 29, 1975, 40 y.o.   MRN: [id number]  HPI Still with congestion and cough.  Started the Zpak on the 18th of January and finished on the 22nd.  Did not feel any better when finished.  Though states did start to have less of a cough this past Tuesday and Wednesday (Jan. 31 and Feb 1).  Mucous was clear. Yesterday, developed increased congestion and cough.  Has thick dark yellow mucous coming from nose.  Throat feels itchy.  Feels like something stuck in throat.  No wheezing or fever.   Does feel a bit short of breath with coughing. Young daughter started with cough beginning of week.  Daughter stays at another home during day with other kids, but no one reportedly ill there.      Meds:  None  Allergies:  NKDA   Review of Systems     Objective:   Physical Exam   NAD, though is definitely more nasally congested than previous HEENT: PERRL, EOMI, TMs, pearly gray, Nasal mucosa swollen with clear discharge, Mild posterior pharyngeal cobbling. Neck:  Supple, no adenopathy Chest:  CTA CV:  RRR without murmur or rub Skin:  No current rash        Assessment & Plan:  1.  Probable recurrent viral URI overlying environmental allergies.  Push fluids, Start Flonase, Fexofenadine regularly.  Follow up 1 month to be sure she clears.

## 2015-05-08 ENCOUNTER — Encounter: Payer: Self-pay | Admitting: Internal Medicine

## 2015-05-28 ENCOUNTER — Encounter: Payer: Self-pay | Admitting: Internal Medicine

## 2015-06-16 ENCOUNTER — Encounter: Payer: Self-pay | Admitting: Internal Medicine

## 2015-08-06 ENCOUNTER — Encounter: Payer: Self-pay | Admitting: Internal Medicine

## 2015-08-06 ENCOUNTER — Ambulatory Visit (INDEPENDENT_AMBULATORY_CARE_PROVIDER_SITE_OTHER): Payer: Self-pay | Admitting: Internal Medicine

## 2015-08-06 VITALS — BP 104/60 | HR 60 | Resp 18 | Wt 147.0 lb

## 2015-08-06 DIAGNOSIS — Z975 Presence of (intrauterine) contraceptive device: Secondary | ICD-10-CM

## 2015-08-06 NOTE — Progress Notes (Signed)
   Subjective:    Patient ID: Paula Villegas, female    DOB: 1975/06/07, 40 y.o.   MRN: [id number]  HPI   Concerned her IUD is out.  Called end of last week as the strings were initially longer and then she could no longer feel them.  She was having a period a the time.  Is just finishing up with period in past 2 days. Had Paragard Copper IUD placed 07-05-2013.  Was not due for removal until 03/2023.  Meds: IUD--Paragard copper  Allergies:  NKDA  Review of Systems     Objective:   Physical Exam NAD GU exam:  2 strings exiting from cervical os--stuck up in left fornix of proximal vaginal canal.       Assessment & Plan:  Concern for loss of IUD:  Actually in place and counseled patient no concern at this time.

## 2016-03-26 ENCOUNTER — Ambulatory Visit: Payer: Self-pay | Admitting: Internal Medicine

## 2016-03-27 ENCOUNTER — Ambulatory Visit (INDEPENDENT_AMBULATORY_CARE_PROVIDER_SITE_OTHER): Payer: Self-pay | Admitting: Internal Medicine

## 2016-03-27 ENCOUNTER — Encounter: Payer: Self-pay | Admitting: Internal Medicine

## 2016-03-27 VITALS — BP 122/78 | HR 70 | Resp 12 | Ht 61.0 in | Wt 148.0 lb

## 2016-03-27 DIAGNOSIS — N898 Other specified noninflammatory disorders of vagina: Secondary | ICD-10-CM

## 2016-03-27 LAB — POCT WET PREP WITH KOH
Clue Cells Wet Prep HPF POC: NEGATIVE
KOH PREP POC: NEGATIVE
TRICHOMONAS UA: NEGATIVE
YEAST WET PREP PER HPF POC: NEGATIVE

## 2016-03-27 NOTE — Patient Instructions (Signed)
Call if you develop new concerning symptoms

## 2016-03-27 NOTE — Progress Notes (Signed)
   Subjective:    Patient ID: Paula Villegas, female    DOB: 1975/04/16, 41 y.o.   MRN: [id number]  HPI   Has noted vaginal odor for 3 days before and 3 days after she menstruates.  She has sometimes now also developed brown discharge with the odor before and after. Her periods remain regular.  She does have a shorter period now.   No discharge or odor in between periods. She has a paraguard IUD in place. No pelvic pain and no pain or discomfort with intercourse.  Current Meds  Medication Sig  . PARAGARD INTRAUTERINE COPPER IUD IUD 1 each by Intrauterine route once. [facility]. Instructed to remove no later than 03/2023   No Known Allergies     Review of Systems     Objective:   Physical Exam   GU:  Copious amount of blood today, but no underlying erythema of vaginal mucosa or cervix.  No CMT or uterine or adnexal mass or tenderness.        Assessment & Plan:  Vaginal odor with what is described as a changing cycle and likely old blood due to slower flow at beginning and end of cycle now.   No findings to suggest any sort of infection and the fact that resolves after dark brown spotting stops argues against infection as well

## 2016-04-03 ENCOUNTER — Ambulatory Visit: Payer: Self-pay | Admitting: Internal Medicine

## 2016-06-10 ENCOUNTER — Observation Stay (HOSPITAL_COMMUNITY): Payer: Self-pay

## 2016-06-10 ENCOUNTER — Encounter (HOSPITAL_COMMUNITY): Payer: Self-pay | Admitting: Emergency Medicine

## 2016-06-10 ENCOUNTER — Observation Stay (HOSPITAL_COMMUNITY)
Admission: EM | Admit: 2016-06-10 | Discharge: 2016-06-11 | Disposition: A | Discharge status: Home / Self Care | Payer: Self-pay | Attending: Family Medicine | Admitting: Family Medicine

## 2016-06-10 DIAGNOSIS — N83209 Unspecified ovarian cyst, unspecified side: Secondary | ICD-10-CM | POA: Insufficient documentation

## 2016-06-10 DIAGNOSIS — M5431 Sciatica, right side: Secondary | ICD-10-CM

## 2016-06-10 DIAGNOSIS — R001 Bradycardia, unspecified: Secondary | ICD-10-CM | POA: Insufficient documentation

## 2016-06-10 DIAGNOSIS — M5410 Radiculopathy, site unspecified: Secondary | ICD-10-CM | POA: Insufficient documentation

## 2016-06-10 DIAGNOSIS — M545 Low back pain, unspecified: Secondary | ICD-10-CM

## 2016-06-10 DIAGNOSIS — N760 Acute vaginitis: Secondary | ICD-10-CM | POA: Insufficient documentation

## 2016-06-10 DIAGNOSIS — Z9049 Acquired absence of other specified parts of digestive tract: Secondary | ICD-10-CM | POA: Insufficient documentation

## 2016-06-10 DIAGNOSIS — R52 Pain, unspecified: Secondary | ICD-10-CM

## 2016-06-10 DIAGNOSIS — M5441 Lumbago with sciatica, right side: Principal | ICD-10-CM | POA: Insufficient documentation

## 2016-06-10 DIAGNOSIS — B9689 Other specified bacterial agents as the cause of diseases classified elsewhere: Secondary | ICD-10-CM | POA: Diagnosis present

## 2016-06-10 DIAGNOSIS — M549 Dorsalgia, unspecified: Secondary | ICD-10-CM | POA: Diagnosis present

## 2016-06-10 DIAGNOSIS — N939 Abnormal uterine and vaginal bleeding, unspecified: Secondary | ICD-10-CM | POA: Insufficient documentation

## 2016-06-10 HISTORY — DX: Abnormal uterine and vaginal bleeding, unspecified: N93.9

## 2016-06-10 HISTORY — DX: Displacement of intrauterine contraceptive device, initial encounter: T83.32XA

## 2016-06-10 HISTORY — DX: Bradycardia, unspecified: R00.1

## 2016-06-10 HISTORY — DX: Other specified bacterial agents as the cause of diseases classified elsewhere: N76.0

## 2016-06-10 HISTORY — DX: Other specified complication of genitourinary prosthetic devices, implants and grafts, initial encounter: T83.89XA

## 2016-06-10 HISTORY — DX: Gastro-esophageal reflux disease without esophagitis: K21.9

## 2016-06-10 HISTORY — DX: Acute vaginitis: B96.89

## 2016-06-10 LAB — PREGNANCY, URINE: Preg Test, Ur: NEGATIVE

## 2016-06-10 LAB — WET PREP, GENITAL
Sperm: NONE SEEN
Trich, Wet Prep: NONE SEEN
Yeast Wet Prep HPF POC: NONE SEEN

## 2016-06-10 LAB — CBC WITH DIFFERENTIAL/PLATELET
Basophils Absolute: 0 10*3/uL (ref 0.0–0.1)
Basophils Relative: 0 %
Eosinophils Absolute: 0.1 10*3/uL (ref 0.0–0.7)
Eosinophils Relative: 1 %
HEMATOCRIT: 37.4 % (ref 36.0–46.0)
HEMOGLOBIN: 12.1 g/dL (ref 12.0–15.0)
Lymphocytes Relative: 40 %
Lymphs Abs: 3.3 10*3/uL (ref 0.7–4.0)
MCH: 27.3 pg (ref 26.0–34.0)
MCHC: 32.4 g/dL (ref 30.0–36.0)
MCV: 84.4 fL (ref 78.0–100.0)
Monocytes Absolute: 0.7 10*3/uL (ref 0.1–1.0)
Monocytes Relative: 9 %
NEUTROS ABS: 4 10*3/uL (ref 1.7–7.7)
NEUTROS PCT: 50 %
Platelets: 240 10*3/uL (ref 150–400)
RBC: 4.43 MIL/uL (ref 3.87–5.11)
RDW: 15 % (ref 11.5–15.5)
WBC: 8.1 10*3/uL (ref 4.0–10.5)

## 2016-06-10 LAB — I-STAT CHEM 8, ED
BUN: 13 mg/dL (ref 6–20)
CHLORIDE: 104 mmol/L (ref 101–111)
Calcium, Ion: 1.13 mmol/L — ABNORMAL LOW (ref 1.15–1.40)
Creatinine, Ser: 0.8 mg/dL (ref 0.44–1.00)
Glucose, Bld: 90 mg/dL (ref 65–99)
HEMATOCRIT: 36 % (ref 36.0–46.0)
Hemoglobin: 12.2 g/dL (ref 12.0–15.0)
Potassium: 3.8 mmol/L (ref 3.5–5.1)
SODIUM: 141 mmol/L (ref 135–145)
TCO2: 28 mmol/L (ref 0–100)

## 2016-06-10 LAB — URINALYSIS, ROUTINE W REFLEX MICROSCOPIC
BILIRUBIN URINE: NEGATIVE
Glucose, UA: NEGATIVE mg/dL
Hgb urine dipstick: NEGATIVE
Ketones, ur: NEGATIVE mg/dL
LEUKOCYTES UA: NEGATIVE
Nitrite: NEGATIVE
PH: 6 (ref 5.0–8.0)
Protein, ur: NEGATIVE mg/dL
Specific Gravity, Urine: 1.01 (ref 1.005–1.030)

## 2016-06-10 LAB — TSH: TSH: 5.349 u[IU]/mL — ABNORMAL HIGH (ref 0.350–4.500)

## 2016-06-10 MED ORDER — SODIUM CHLORIDE 0.9 % IV SOLN
INTRAVENOUS | Status: AC
  Administered 2016-06-10: 18:00:00 via INTRAVENOUS

## 2016-06-10 MED ORDER — MORPHINE SULFATE (PF) 4 MG/ML IV SOLN
4.0000 mg | Freq: Once | INTRAVENOUS | Status: AC
  Administered 2016-06-10: 4 mg via INTRAVENOUS
  Filled 2016-06-10: qty 1

## 2016-06-10 MED ORDER — DICLOFENAC SODIUM 1 % TD GEL
4.0000 g | Freq: Four times a day (QID) | TRANSDERMAL | Status: DC | PRN
  Filled 2016-06-10: qty 100

## 2016-06-10 MED ORDER — DEXAMETHASONE 6 MG PO TABS
10.0000 mg | ORAL_TABLET | Freq: Every day | ORAL | Status: DC
  Administered 2016-06-10 – 2016-06-11 (×2): 10 mg via ORAL
  Filled 2016-06-10 (×2): qty 1

## 2016-06-10 MED ORDER — ACETAMINOPHEN 325 MG PO TABS: 650.0000 mg | ORAL_TABLET | Freq: Four times a day (QID) | ORAL | Status: DC | PRN

## 2016-06-10 MED ORDER — ACETAMINOPHEN 650 MG RE SUPP: 650.0000 mg | Freq: Four times a day (QID) | RECTAL | Status: DC | PRN

## 2016-06-10 MED ORDER — KETOROLAC TROMETHAMINE 30 MG/ML IJ SOLN
30.0000 mg | Freq: Once | INTRAMUSCULAR | Status: AC
  Administered 2016-06-10: 30 mg via INTRAVENOUS
  Filled 2016-06-10: qty 1

## 2016-06-10 MED ORDER — KETOROLAC TROMETHAMINE 15 MG/ML IJ SOLN
15.0000 mg | Freq: Four times a day (QID) | INTRAMUSCULAR | Status: DC | PRN
  Administered 2016-06-11: 15 mg via INTRAVENOUS
  Filled 2016-06-10: qty 1

## 2016-06-10 MED ORDER — PARAGARD INTRAUTERINE COPPER IU IUD: 1.0000 | INTRAUTERINE_SYSTEM | Freq: Once | INTRAUTERINE | Status: DC

## 2016-06-10 MED ORDER — METHOCARBAMOL 500 MG PO TABS
1000.0000 mg | ORAL_TABLET | Freq: Three times a day (TID) | ORAL | Status: AC
  Administered 2016-06-10 – 2016-06-11 (×4): 1000 mg via ORAL
  Filled 2016-06-10 (×4): qty 2

## 2016-06-10 MED ORDER — TRAZODONE HCL 50 MG PO TABS: 25.0000 mg | ORAL_TABLET | Freq: Every evening | ORAL | Status: DC | PRN

## 2016-06-10 MED ORDER — ENOXAPARIN SODIUM 40 MG/0.4ML ~~LOC~~ SOLN
40.0000 mg | SUBCUTANEOUS | Status: DC
  Administered 2016-06-11: 40 mg via SUBCUTANEOUS
  Filled 2016-06-10: qty 0.4

## 2016-06-10 MED ORDER — ONDANSETRON HCL 4 MG PO TABS: 4.0000 mg | ORAL_TABLET | Freq: Four times a day (QID) | ORAL | Status: DC | PRN

## 2016-06-10 MED ORDER — ONDANSETRON HCL 4 MG/2ML IJ SOLN: 4.0000 mg | Freq: Four times a day (QID) | INTRAMUSCULAR | Status: DC | PRN

## 2016-06-10 MED ORDER — HYDROCODONE-ACETAMINOPHEN 5-325 MG PO TABS
1.0000 | ORAL_TABLET | ORAL | Status: DC | PRN
  Administered 2016-06-11: 2 via ORAL
  Administered 2016-06-11: 1 via ORAL
  Filled 2016-06-10: qty 1
  Filled 2016-06-10: qty 2

## 2016-06-10 MED ORDER — HYDROMORPHONE HCL 1 MG/ML IJ SOLN
1.0000 mg | Freq: Once | INTRAMUSCULAR | Status: AC
  Administered 2016-06-10: 1 mg via INTRAVENOUS
  Filled 2016-06-10: qty 1

## 2016-06-10 MED ORDER — METRONIDAZOLE 500 MG PO TABS
500.0000 mg | ORAL_TABLET | Freq: Two times a day (BID) | ORAL | Status: DC
  Administered 2016-06-10 – 2016-06-11 (×2): 500 mg via ORAL
  Filled 2016-06-10 (×3): qty 1

## 2016-06-10 NOTE — ED Notes (Signed)
Patient transported to CT 

## 2016-06-10 NOTE — ED Provider Notes (Addendum)
[facility]-EMERGENCY DEPT Provider Note   CSN: [id number] Arrival date & time: 06/10/16  [id number]     History   Chief Complaint Chief Complaint  Patient presents with  . Back Pain  . Vaginal Bleeding    HPI Paula Villegas is a 41 y.o. female.Complains of low back pain at the presacral area radiating to lower abdomen onset yesterday, gradually accompanied by vaginal bleeding which started yesterday. Her last normal menstrual period was 2 days ago. She denies nausea or vomiting or fever. Pain is worse with movement or attempting to walk improved with remaining still. Patient is able to walk however has severe pain with walking. No loss of bladder or bowel control. No other associated symptoms.  HPI  Past Medical History:  Diagnosis Date  . Ovarian cyst 2014  . Preterm labor    PTL x2, PTD x1    Patient Active Problem List   Diagnosis Date Noted  . Appendicitis 05/22/2011  . 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 ANEMIA-NOS 12/14/2008  . BILIARY COLIC 12/14/2008    Past Surgical History:  Procedure Laterality Date  . APPENDECTOMY  2014   Laparoscopic  . CHOLECYSTECTOMY  2012   Laparoscopic    OB History    Gravida Para Term Preterm AB Living   5 4 3 1 1 4    SAB TAB Ectopic Multiple Live Births   1       4       Home Medications    Prior to Admission medications   Medication Sig Start Date End Date Taking? Authorizing Provider  albuterol (PROVENTIL HFA;VENTOLIN HFA) 108 (90 Base) MCG/ACT inhaler Inhale 2 puffs into the lungs every 4 (four) hours as needed for wheezing or shortness of breath. Patient not taking: Reported on 03/27/2016 03/25/15   Hayden Rasmussenavid Mabe, NP  azithromycin (ZITHROMAX) 250 MG tablet 2 tabs by mouth the first day, then 1 tab daily for 4 more days Patient not taking: Reported on 03/27/2016 04/10/15   Julieanne MansonElizabeth Mulberry, MD  fexofenadine (ALLEGRA) 180 MG tablet Take 1 tablet (180 mg total) by mouth daily. Patient not taking: Reported on 03/27/2016 04/26/15    Julieanne MansonElizabeth Mulberry, MD  fluticasone [facility](FLONASE) 50 MCG/ACT nasal spray Place 2 sprays into both nostrils daily. Patient not taking: Reported on 03/27/2016 04/26/15   Julieanne MansonElizabeth Mulberry, MD  [facility] 1 each by Intrauterine route once. [facility]. Instructed to remove no later than 03/2023    Historical Provider, MD    Family History Family History  Problem Relation Age of Onset  . Hypertension Mother   . Cancer Mother     uterine  . Asthma Brother   . Diabetes Brother   . Diabetes Father   . Alcohol abuse Father   . Seizures Brother 34    post traumatic brain injury --pedestrian hit by car  . Hearing loss Neg Hx     Social History Social History  Substance Use Topics  . Smoking status: Never Smoker  . Smokeless tobacco: Never Used  . Alcohol use Yes     Comment: at a party on rare occasion.     Allergies   Patient has no known allergies.   Review of Systems Review of Systems  Constitutional: Negative.   HENT: Negative.   Respiratory: Negative.   Cardiovascular: Negative.   Gastrointestinal: Negative.   Genitourinary: Positive for vaginal bleeding.  Musculoskeletal: Positive for back pain.  Skin: Negative.   Neurological: Negative.   Psychiatric/Behavioral: Negative.  All other systems reviewed and are negative.    Physical Exam Updated Vital Signs BP 111/62 (BP Location: Right Arm)   Pulse 76   Temp 98 F (36.7 C) (Oral)   Resp 16   Wt 148 lb (67.1 kg)   LMP 05/25/2016   SpO2 100%   BMI 27.96 kg/m   Physical Exam  Constitutional: She appears well-developed and well-nourished.  HENT:  Head: Normocephalic and atraumatic.  Eyes: Conjunctivae are normal. Pupils are equal, round, and reactive to light.  Neck: Neck supple. No tracheal deviation present. No thyromegaly present.  Cardiovascular: Normal rate and regular rhythm.   No murmur heard. Pulmonary/Chest: Effort normal and breath sounds normal.  Abdominal:  Soft. Bowel sounds are normal. She exhibits no distension. There is no tenderness.  Genitourinary:  Genitourinary Comments: Pelvic exam no external lesion. Slight amount of blood in vaginal vault. Cervical os closed. No cervical motion tenderness. No adnexal masses or tenderness.  Musculoskeletal: Normal range of motion. She exhibits no edema or tenderness.  No point tenderness along spine. She has pain at parasacral area when she moves in bed. Motor strength 5 over 5 overall.  Neurological: She is alert. Coordination normal.  Skin: Skin is warm and dry. No rash noted.  Psychiatric: She has a normal mood and affect.  Nursing note and vitals reviewed.    ED Treatments / Results  Labs (all labs ordered are listed, but only abnormal results are displayed) Labs Reviewed  WET PREP, GENITAL  RPR  HIV ANTIBODY (ROUTINE TESTING)  URINALYSIS, ROUTINE W REFLEX MICROSCOPIC  POC URINE PREG, ED  GC/CHLAMYDIA PROBE AMP ([place]) NOT AT [facility]   10:45 AM pain somewhat improved after treatment with intravenous morphine. Continues to have significant pain. Additional IV morphine ordered EKG  EKG Interpretation None       Radiology No results found.  Procedures Procedures (including critical care time)  Medications Ordered in ED Medications  morphine 4 MG/ML injection 4 mg (not administered)    Results for orders placed or performed during the hospital encounter of 06/10/16  Wet prep, genital  Result Value Ref Range   Yeast Wet Prep HPF POC NONE SEEN NONE SEEN   Trich, Wet Prep NONE SEEN NONE SEEN   Clue Cells Wet Prep HPF POC PRESENT (A) NONE SEEN   WBC, Wet Prep HPF POC MODERATE (A) NONE SEEN   Sperm NONE SEEN   Urinalysis, Routine w reflex microscopic  Result Value Ref Range   Color, Urine YELLOW YELLOW   APPearance CLEAR CLEAR   Specific Gravity, Urine 1.010 1.005 - 1.030   pH 6.0 5.0 - 8.0   Glucose, UA NEGATIVE NEGATIVE mg/dL   Hgb urine dipstick NEGATIVE NEGATIVE    Bilirubin Urine NEGATIVE NEGATIVE   Ketones, ur NEGATIVE NEGATIVE mg/dL   Protein, ur NEGATIVE NEGATIVE mg/dL   Nitrite NEGATIVE NEGATIVE   Leukocytes, UA NEGATIVE NEGATIVE  Pregnancy, urine  Result Value Ref Range   Preg Test, Ur NEGATIVE NEGATIVE   No results found. Initial Impression / Assessment and Plan / ED Course  I have reviewed the triage vital signs and the nursing notes.  Pertinent labs & imaging results that were available during my care of the patient were reviewed by me and considered in my medical decision making (see chart for details).     12:55 PM pain somewhat improved after intravenous morphine. However she still has significant pain on sitting up from a supine position. She transiently complained of epigastric pain,  after IV morphine administered which resolved spontaneously. IV Toradol ordered 2:50 PM after having received 3 doses of intravenous opioids pain medicine and intravenous Toradol back pain is still severe. She has minimal or no back pain when lying supine however she has extreme pain when attempting to sit up from a supine position. Back pain is felt to be musculoskeletal in etiology, and unrelated to dysfunctional uterine bleeding. I've consulted the hospitalist service for overnight stay for pain control. Ms. black, NP evaluate patient in ED and arrange for overnight stay  Final Clinical Impressions(s) / ED Diagnoses  Dx #1 acute midline low back pain without sciatica  #2 dysfunctional uterine bleeding  Final diagnoses:  None   #3Bacterial vaginosis New Prescriptions New Prescriptions   No medications on file     Doug Sou, MD 06/10/16 1521    Doug Sou, MD 06/10/16 [id number]    Doug Sou, MD 06/10/16 1191

## 2016-06-10 NOTE — ED Triage Notes (Signed)
Pt states she started having back pain yesterday. Pt also states she started having vaginal bleeding yesterday- but her period was 2 weeks ago. Today, pt states she is in so much pain in her back she cannot stand.

## 2016-06-10 NOTE — H&P (Signed)
History and Physical    Paula Villegas:[id number] DOB: Apr 29, 1975 DOA: 06/10/2016  PCP: Julieanne Manson, MD Patient coming from: home  Chief Complaint: low back pain  HPI: Paula Villegas is a very pleasant 41 y.o. female with medical history significant for IUD migration, anemia, ovarian cysts presents to the emergency department with the chief complaint of low back pain. Initial evaluation reveals tractable back pain with inability to move, bacterial vaginosis abnormal vaginal bleeding  Information is obtained on the patient she states that today she developed sudden onset low back pain. She describes pain as constant sharp located sacral area with intermittent radiation down left leg. States is worse with moving and bearing weight. She denies any numbness tingling of her lower extremities. She denies any incontinence of bladder or bowel. He denies any fall or injury. She states pain is to severe with movement and therefore cannot walk. Addition she complains of vaginal bleeding. She states she had her menstrual cycle 2 weeks ago. Over the last 2 days she reports a "very small" amount of vaginal bleeding. One tampon per day with suffice.    ED Course: In the emergency department she's hemodynamically stable and not hypoxic. She is provided with pain medicine 3 with little relief  Review of Systems: As per HPI otherwise 10 point review of systems negative.   Ambulatory Status: Ambulates independently  Past Medical History:  Diagnosis Date  . Abnormal vaginal bleeding   . Bradycardia   . BV (bacterial vaginosis)   . IUD migration (HCC)   . Ovarian cyst 2014  . Preterm labor    PTL x2, PTD x1    Past Surgical History:  Procedure Laterality Date  . APPENDECTOMY  2014   Laparoscopic  . CHOLECYSTECTOMY  2012   Laparoscopic    Social History   Social History  . Marital status: Single    Spouse name: N/A  . Number of children: 4  . Years of education: 62    Occupational History  . [employer]     [employer] in [place]   Social History Main Topics  . Smoking status: Never Smoker  . Smokeless tobacco: Never Used  . Alcohol use Yes     Comment: at a party on rare occasion.  . Drug use: No  . Sexual activity: Yes    Birth control/ protection: IUD     Comment: Paragard Copper T,  Good until 03/2023, placed 07/04/2013   Other Topics Concern  . Not on file   Social History Narrative   [place]   Came to [employer] [place]. In 2001   Lives with youngest daughter.   Father of children lives in [place], they are no longer a couple.   43 yo lives with him.    Older children live on their own now.   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    No Known Allergies  Family History  Problem Relation Age of Onset  . Hypertension Mother   . Cancer Mother     uterine  . Asthma Brother   . Diabetes Brother   . Diabetes Father   . Alcohol abuse Father   . Seizures Brother 34    post traumatic brain injury --pedestrian hit by car  . Hearing loss Neg Hx     Prior to Admission medications   Medication Sig Start Date End Date Taking? Authorizing Provider  acetaminophen (TYLENOL) 500 MG tablet Take 1,000 mg by mouth  every 6 (six) hours as needed for moderate pain.    Yes Historical Provider, MD  PARAGARD INTRAUTERINE COPPER IUD IUD 1 each by Intrauterine route once. [facility]. Instructed to remove no later than 03/2023   Yes Historical Provider, MD    Physical Exam: Vitals:   06/10/16 1315 06/10/16 1330 06/10/16 1345 06/10/16 1415  BP: 100/67 106/67 105/65 101/63  Pulse: (!) 54 (!) 55 (!) 55 (!) 54  Resp:   16 18  Temp:      TempSrc:      SpO2: 98% 98% 100% 96%  Weight:         General:  Appears calm and comfortable No acute distress Eyes:  PERRL, EOMI, normal lids, iris ENT:  grossly normal hearing, lips & tongue, his membranes of her mouth are moist and pink Neck:  no LAD,  masses or thyromegaly Cardiovascular:  RRR, no m/r/g. No LE edema.  Respiratory:  CTA bilaterally, no w/r/r. Normal respiratory effort. Abdomen:  soft, ntnd, NABS Skin:  no rash or induration seen on limited exam Musculoskeletal:  grossly normal tone BUE/BLE, good ROM, no bony abnormality joints without swelling/erythema Psychiatric:  grossly normal mood and affect, speech fluent and appropriate, AOx3 Neurologic:  CN 2-12 grossly intact, moves all extremities in coordinated fashion, sensation intact lower extremity strength 5 out of 5 bilaterally sensation intact  Labs on Admission: I have personally reviewed following labs and imaging studies  CBC:  Recent Labs Lab 06/10/16 1535  WBC 8.1  NEUTROABS 4.0  HGB 12.1  HCT 37.4  MCV 84.4  PLT 240   Basic Metabolic Panel: No results for input(s): NA, K, CL, CO2, GLUCOSE, BUN, CREATININE, CALCIUM, MG, PHOS in the last 168 hours. GFR: CrCl cannot be calculated (Patient's most recent lab result is older than the maximum 21 days allowed.). Liver Function Tests: No results for input(s): AST, ALT, ALKPHOS, BILITOT, PROT, ALBUMIN in the last 168 hours. No results for input(s): LIPASE, AMYLASE in the last 168 hours. No results for input(s): AMMONIA in the last 168 hours. Coagulation Profile: No results for input(s): INR, PROTIME in the last 168 hours. Cardiac Enzymes: No results for input(s): CKTOTAL, CKMB, CKMBINDEX, TROPONINI in the last 168 hours. BNP (last 3 results) No results for input(s): PROBNP in the last 8760 hours. HbA1C: No results for input(s): HGBA1C in the last 72 hours. CBG: No results for input(s): GLUCAP in the last 168 hours. Lipid Profile: No results for input(s): CHOL, HDL, LDLCALC, TRIG, CHOLHDL, LDLDIRECT in the last 72 hours. Thyroid Function Tests: No results for input(s): TSH, T4TOTAL, FREET4, T3FREE, THYROIDAB in the last 72 hours. Anemia Panel: No results for input(s): VITAMINB12, FOLATE, FERRITIN, TIBC,  IRON, RETICCTPCT in the last 72 hours. Urine analysis:    Component Value Date/Time   COLORURINE YELLOW 06/10/2016 1021   APPEARANCEUR CLEAR 06/10/2016 1021   LABSPEC 1.010 06/10/2016 1021   PHURINE 6.0 06/10/2016 1021   GLUCOSEU NEGATIVE 06/10/2016 1021   HGBUR NEGATIVE 06/10/2016 1021   BILIRUBINUR NEGATIVE 06/10/2016 1021   KETONESUR NEGATIVE 06/10/2016 1021   PROTEINUR NEGATIVE 06/10/2016 1021   UROBILINOGEN 0.2 05/06/2013 1815   NITRITE NEGATIVE 06/10/2016 1021   LEUKOCYTESUR NEGATIVE 06/10/2016 1021    Creatinine Clearance: CrCl cannot be calculated (Patient's most recent lab result is older than the maximum 21 days allowed.).  Sepsis Labs: @LABRCNTIP (procalcitonin:4,lacticidven:4) ) Recent Results (from the past 240 hour(s))  Wet prep, genital     Status: Abnormal   Collection Time: 06/10/16  10:50 AM  Result Value Ref Range Status   Yeast Wet Prep HPF POC NONE SEEN NONE SEEN Final   Trich, Wet Prep NONE SEEN NONE SEEN Final   Clue Cells Wet Prep HPF POC PRESENT (A) NONE SEEN Final   WBC, Wet Prep HPF POC MODERATE (A) NONE SEEN Final   Sperm NONE SEEN  Final     Radiological Exams on Admission: No results found.  EKG:  Assessment/Plan Principal Problem:   Intractable back pain Active Problems:   BV (bacterial vaginosis)   Abnormal uterine bleeding   Bradycardia   Ovarian cyst   1. Intractable back pain. Presentation favors musculoskeletal. No reports of any accidents falls injury. Exam benign. Pain persisted in spite of analgesia given in the emergency department. Sone concern for referred pain from pelvis. Hx IUD migration and ovarian cyst. No indications of infection  -Admit to MedSurg -pain management -decadron -robaxin -physical therapy -CT lumbar spine  #2. Abnormal uterine bleeding. Etiology uncertain. History of ovarian cyst and IUD migration. A scant amount of blood. -CT lumbar spine, will request visualization of IUD if possibe -monitor -OP  follow up with OB/gyn  3. Bacterial vaginosis. Wet prep +clue cells -flagyl  #4. Bradycardia. Mild HR range 51-54.  -obtain TSH   DVT prophylaxis: lovenox  Code Status: full  Family Communication: daughter at bedside  Disposition Plan: home hopefully 24 hous  Consults called: none Admission status: obs    Toya Smothers M MD [facility]  If 7PM-7AM, please contact night-coverage [email or web address] Password [email or web address]  06/10/2016, 3:56 PM

## 2016-06-10 NOTE — ED Notes (Signed)
Attempted report x1. 

## 2016-06-11 DIAGNOSIS — M545 Low back pain: Secondary | ICD-10-CM

## 2016-06-11 DIAGNOSIS — N760 Acute vaginitis: Secondary | ICD-10-CM

## 2016-06-11 DIAGNOSIS — M549 Dorsalgia, unspecified: Secondary | ICD-10-CM

## 2016-06-11 DIAGNOSIS — R001 Bradycardia, unspecified: Secondary | ICD-10-CM

## 2016-06-11 DIAGNOSIS — B9689 Other specified bacterial agents as the cause of diseases classified elsewhere: Secondary | ICD-10-CM

## 2016-06-11 DIAGNOSIS — N83209 Unspecified ovarian cyst, unspecified side: Secondary | ICD-10-CM

## 2016-06-11 LAB — GC/CHLAMYDIA PROBE AMP (~~LOC~~) NOT AT ARMC
Chlamydia: NEGATIVE
NEISSERIA GONORRHEA: NEGATIVE

## 2016-06-11 LAB — RPR, QUANT+TP ABS (REFLEX): T Pallidum Abs: NEGATIVE

## 2016-06-11 LAB — RPR: RPR Ser Ql: REACTIVE — AB

## 2016-06-11 LAB — HIV ANTIBODY (ROUTINE TESTING W REFLEX): HIV SCREEN 4TH GENERATION: NONREACTIVE

## 2016-06-11 MED ORDER — PREDNISONE 20 MG PO TABS: ORAL_TABLET | ORAL | 0 refills | Status: DC

## 2016-06-11 MED ORDER — METHOCARBAMOL 500 MG PO TABS: 1000.0000 mg | ORAL_TABLET | Freq: Three times a day (TID) | ORAL | 0 refills | Status: DC

## 2016-06-11 MED ORDER — DICLOFENAC SODIUM 1 % TD GEL: 4.0000 g | Freq: Four times a day (QID) | TRANSDERMAL | 0 refills | Status: DC | PRN

## 2016-06-11 MED ORDER — POLYETHYLENE GLYCOL 3350 17 G PO PACK: 17.0000 g | PACK | Freq: Every day | ORAL | 0 refills | Status: DC

## 2016-06-11 MED ORDER — HYDROCODONE-ACETAMINOPHEN 5-325 MG PO TABS: 1.0000 | ORAL_TABLET | ORAL | 0 refills | Status: DC | PRN

## 2016-06-11 MED ORDER — DICLOFENAC SODIUM 75 MG PO TBEC: 75.0000 mg | DELAYED_RELEASE_TABLET | Freq: Two times a day (BID) | ORAL | 0 refills | Status: DC

## 2016-06-11 MED ORDER — METRONIDAZOLE 500 MG PO TABS: 500.0000 mg | ORAL_TABLET | Freq: Two times a day (BID) | ORAL | 0 refills | Status: AC

## 2016-06-11 NOTE — Progress Notes (Signed)
Pt being discharged home this evening and will need a rolling walker and 3in1 after discharge. Case management called and message left informing of the home needs

## 2016-06-11 NOTE — Care Management Note (Signed)
Case Management Note  Patient Details  Name: Paula GarbeMaria C Villegas MRN: [id number] Date of Birth: 1975/07/15  Subjective/Objective: Pt admitted on 06/10/16 with intractable back pain.  PTA, pt independent, lives with significant other.                    Action/Plan: PT recommending no OP follow up.  Will follow for dc needs.    Expected Discharge Date:                  Expected Discharge Plan:  Home/Self Care  In-House Referral:     Discharge planning Services  CM Consult  Post Acute Care Choice:    Choice offered to:     DME Arranged:    DME Agency:     HH Arranged:    HH Agency:     Status of Service:  In process, will continue to follow  If discussed at Long Length of Stay Meetings, dates discussed:    Additional Comments:  Glennon Macmerson, Othello Dickenson M, RN 06/11/2016, 3:24 PM

## 2016-06-11 NOTE — Progress Notes (Signed)
Pt discharged in stable condition this evening after going over discharge instructions and medicines with no concerns voiced. Called care management as pt will need a walker and 3in1 and advised that pt can go home and have equipment delivered at home. Pt informed to call unit if equipment is not delivered by late afternoon or if she does not hear anything about the equipment

## 2016-06-11 NOTE — Discharge Instructions (Signed)
Dolor radicular (Radicular Pain) El dolor radicular es un tipo de dolor que se extiende desde la espalda o el cuello a lo largo del nervio raqudeo. Los nervios raqudeos se originan en la mdula espinal y llegan a los msculos. El dolor radicular se produce cuando uno de estos nervios se irrita o se comprime. El dolor radicular a veces se conoce como radiculopata, radiculitis o pinzamiento de un nervio. Si tiene este tipo de Engineer, miningdolor, tambin puede sentir debilidad, adormecimiento u hormigueo en la zona del cuerpo a la que llega ese nervio. El dolor puede ser agudo y sentirse como un ardor. Los nervios raqudeos salen de la mdula espinal a travs de orificios que estn The Krogerentre los 24huesos (vrtebras) que forman la columna. A menudo, el dolor radicular se debe a algo que hace presin sobre el nervio raqudeo. Esto puede ser Neomia Dearuna vrtebra o uno de los cojines redondos que estn entre las vrtebras (discos intervertebrales). La causa puede ser una lesin, el desgaste o el envejecimiento de un disco, o el crecimiento de un espoln seo que aprieta el [place]. El dolor radicular puede darse en diversas zonas, segn el nervio raqudeo que est afectado:  El dolor radicular cervical se produce en el cuello. Tambin puede Financial risk analystsentir dolor, adormecimiento, debilidad u hormigueo Sears Holdings Corporationen los brazos.  El dolor radicular torcico se produce en la zona de la mitad de la columna. Se siente en la espalda y en el pecho. Este tipo es poco frecuente.  El dolor radicular lumbar se produce en la zona inferior de la espalda. Se siente un dolor lumbar. Tambin puede Financial risk analystsentir dolor, adormecimiento, debilidad u hormigueo en los glteos o en las piernas. La citica es un tipo de dolor radicular lumbar que se extiende por la parte de atrs de la pierna. El dolor radicular suele desaparecer cuando se siguen las indicaciones del mdico sobre cmo aliviarlo en la casa. INSTRUCCIONES PARA EL CUIDADO EN EL HOGAR Control del dolor  Si se lo  indican, aplique hielo en la zona afectada:  Ponga el hielo en una bolsa plstica.  Coloque una toalla entre la piel y la bolsa de hielo.  Coloque el hielo durante 20minutos, 2 a 3veces por Futures traderda.  Si se lo indican, aplique calor en la zona afectada tan frecuentemente como se lo haya indicado el mdico. Use la fuente de calor que el mdico le recomiende, como una compresa de calor hmedo o una almohadilla trmica.  Coloque una toalla entre la piel y la fuente de Airline pilotcalor.  Aplique el calor durante 20 a 30minutos.  Retire la fuente de calor si la piel se le pone de color rojo brillante. Esto es muy importante si no puede sentir el dolor, el calor o el fro. Puede correr un riesgo mayor de sufrir quemaduras. Actividad  No permanezca sentado o acostado durante largos perodos.  Intente mantenerse lo ms activo posible. Pregntele al mdico qu tipo de ejercicio o actividad es mejor para usted.  Evite las actividades que empeoren el dolor, como doblarse y Lexicographerlevantar objetos.  No levante ningn objeto que pese ms de 10libras (4,5kg). Use una tcnica adecuada al levantar objetos. La tcnica adecuada para levantar objetos consiste en doblar las rodillas y levantarse.  Haga los ejercicios de flexibilidad y de fortalecimiento solamente como se lo haya indicado el mdico. Instrucciones generales  Tome los medicamentos de venta libre y los recetados solamente como se lo haya indicado el mdico.  Est atento a cualquier cambio en los sntomas.  Concurra  a todas las visitas de control como se lo haya indicado el mdico. Esto es importante. SOLICITE ATENCIN MDICA SI:  El dolor y otros sntomas empeoran.  El medicamento no IT trainer.  El dolor no mejora despus de unas semanas de cuidado Facilities manager.  Tiene fiebre. SOLICITE ATENCIN MDICA DE INMEDIATO SI:  Tiene dolor, adormecimiento o debilidad intensos.  Tiene dificultad con el control de la vejiga o los intestinos. Esta  informacin no tiene Theme park manager el consejo del mdico. Asegrese de hacerle al mdico cualquier pregunta que tenga. Document Released: 03/09/2005 Document Revised: 07/01/2015 Document Reviewed: 10/03/2014 Elsevier Interactive Patient Education  2017 [employer].

## 2016-06-11 NOTE — Discharge Summary (Signed)
Physician Discharge Summary  Paula GarbeMaria C Villegas JXB:[id number] DOB: 04/08/1975 DOA: 06/10/2016  PCP: Julieanne MansonMULBERRY,ELIZABETH, MD  Admit date: 06/10/2016 Discharge date: 06/11/2016  Admitted From: Home Disposition: Home  Recommendations for Outpatient Follow-up:  1. Follow up with PCP in 1 week 2. Follow up with orthopedic surgeon in 1 week 3. Prednisone taper 4. Consider MRI if worsening symptoms  Home Health: None Equipment/Devices: Rolling walker, 3-in-1  Discharge Condition: Fair CODE STATUS: Full code Diet recommendation: Regular diet   Brief/Interim Summary:  Admission HPI written by Ozella Rocksavid J Merrell, MD   Chief Complaint: low back pain  HPI: Paula GarbeMaria C Villegas is a very pleasant 41 y.o. female with medical history significant for IUD migration, anemia, ovarian cysts presents to the emergency department with the chief complaint of low back pain. Initial evaluation reveals tractable back pain with inability to move, bacterial vaginosis abnormal vaginal bleeding  Information is obtained on the patient she states that today she developed sudden onset low back pain. She describes pain as constant sharp located sacral area with intermittent radiation down left leg. States is worse with moving and bearing weight. She denies any numbness tingling of her lower extremities. She denies any incontinence of bladder or bowel. He denies any fall or injury. She states pain is to severe with movement and therefore cannot walk. Addition she complains of vaginal bleeding. She states she had her menstrual cycle 2 weeks ago. Over the last 2 days she reports a "very small" amount of vaginal bleeding. One tampon per day with suffice.    ED Course: In the emergency department she's hemodynamically stable and not hypoxic. She is provided with pain medicine 3 with little relief    Hospital course:  Back pain with radiculopathy Possibly secondary to some SI joint dysfunction. Some degenerative  changes seen on CT scan. No emergent symptoms. Started on dexamethasone, Norco, Toradol, Robaxin. Discussed with Dr. Aundria Rudogers of orthopedic surgeon who recommended continued regimen and follow-up as an outpatient. No red flags.  Abnormal uterine bleeding Outpatient follow-up  Bacterial vaginosis Started metronidazole  Bradycardia Asymptomatic. Follow-up outpatient.  Discharge Diagnoses:  Principal Problem:   Intractable back pain Active Problems:   BV (bacterial vaginosis)   Abnormal uterine bleeding   Bradycardia   Ovarian cyst   Sciatica of right side    Discharge Instructions  Discharge Instructions    Call MD for:  persistant dizziness or light-headedness    Complete by:  As directed    Call MD for:  persistant nausea and vomiting    Complete by:  As directed    Call MD for:  severe uncontrolled pain    Complete by:  As directed      Allergies as of 06/11/2016   No Known Allergies     Medication List    STOP taking these medications   acetaminophen 500 MG tablet Commonly known as:  TYLENOL     TAKE these medications   diclofenac 75 MG EC tablet Commonly known as:  VOLTAREN Take 1 tablet (75 mg total) by mouth 2 (two) times daily.   diclofenac sodium 1 % Gel Commonly known as:  VOLTAREN Apply 4 g topically 4 (four) times daily as needed (back pain).   HYDROcodone-acetaminophen 5-325 MG tablet Commonly known as:  NORCO/VICODIN Take 1-2 tablets by mouth every 4 (four) hours as needed for moderate pain.   methocarbamol 500 MG tablet Commonly known as:  ROBAXIN Take 2 tablets (1,000 mg total) by mouth 3 (three) times daily.  metroNIDAZOLE 500 MG tablet Commonly known as:  FLAGYL Take 1 tablet (500 mg total) by mouth every 12 (twelve) hours.   PARAGARD INTRAUTERINE COPPER Iud IUD 1 each by Intrauterine route once. Placed 07-04-2013 at [facility]. Instructed to remove no later than 03/2023   polyethylene glycol packet Commonly known as:  MIRALAX Take 17 g by  mouth daily.   predniSONE 20 MG tablet Commonly known as:  DELTASONE Take 60mg  x 3 days, 40mg  x 3 days, 20mg  x 4 days 10mg  x4 days            Durable Medical Equipment        Start     Ordered   06/11/16 1615  For home use only DME Walker rolling  Once    Question:  Patient needs a walker to treat with the following condition  Answer:  Acute radicular low back pain   06/11/16 1614   06/11/16 1615  For home use only DME 3 n 1  Once     06/11/16 1614     Follow-up Information    Yolonda Kida, MD. Schedule an appointment as soon as possible for a visit in 1 week(s).   Specialty:  Orthopedic Surgery Why:  Back pain Contact information: [address] [place] [address] [phone]        [facility], MD. Schedule an appointment as soon as possible for a visit in 1 week(s).   Specialty:  Internal Medicine Contact information: [address] [place] [address] [phone]          No Known Allergies  Consultations:  Orthopedic surgery (phone); Dr. Duwayne Heck   Procedures/Studies: Ct Lumbar Spine Wo Contrast  Result Date: 06/10/2016 CLINICAL DATA:  Low back pain since yesterday. No known injury. Pain radiates into both legs. EXAM: CT LUMBAR SPINE WITHOUT CONTRAST TECHNIQUE: Multidetector CT imaging of the lumbar spine was performed without intravenous contrast administration. Multiplanar CT image reconstructions were also generated. COMPARISON:  Abdominopelvic CT 05/22/2011. FINDINGS: Segmentation: 5 lumbar type vertebrae. Alignment: Normal. Vertebrae: No evidence of acute fracture or pars defect. There is a bone island in the L4 vertebral body which is stable. There are mild endplate degenerative changes at L5-S1, asymmetric to the right. There is asymmetric subchondral sclerosis surround the inferior aspect of the left sacroiliac joint which appears unchanged. Paraspinal and other soft tissues: No paraspinal abnormalities are  identified. There is a 1.8 x 1.0 cm right adrenal nodule which is unchanged from previous CT. Although this has a nonspecific density, this is attributed to an adenoma based on its stability. Prior cholecystectomy and intrauterine device are noted. Disc levels: No evidence of disc herniation, spinal stenosis or nerve root encroachment. Mild chronic disc degeneration at L5-S1 is associated with endplate osteophytes asymmetric to the right. No resulting foraminal narrowing or nerve root encroachment. IMPRESSION: 1. No acute findings or explanation for the patient's symptoms. 2. 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 3. Stable small right adrenal adenoma and asymmetric sclerosis around the left sacroiliac joint. Electronically Signed   By: Carey Bullocks M.D.   On: 06/10/2016 16:40       Subjective: Patient reports some improvement in back pain.  Discharge Exam: Vitals:   06/11/16 1053 06/11/16 1400  BP: (!) 102/57 (!) 100/55  Pulse: 67 60  Resp:    Temp: 99.1 F (37.3 C) 98.3 F (36.8 C)   Vitals:   06/11/16 0459 06/11/16  0741 06/11/16 1053 06/11/16 1400  BP: (!) 93/50  (!) 102/57 (!) 100/55  Pulse: (!) 57  67 60  Resp: 18     Temp: 98.3 F (36.8 C)  99.1 F (37.3 C) 98.3 F (36.8 C)  TempSrc: Oral  Oral Oral  SpO2: 100%  98% 98%  Weight:  64 kg (141 lb 1.6 oz)      General: Pt is alert, awake, not in acute distress Cardiovascular: RRR, S1/S2 +, no rubs, no gallops Respiratory: CTA bilaterally, no wheezing, no rhonchi Abdominal: Soft, NT, ND, bowel sounds + Musculoskeletal: no edema, no cyanosis. Midline back pain near sacrum Neurological: straight-leg positive bilaterally. 3+ patellar reflexes bilaterally. Normal sensation in lower extremities    The results of significant diagnostics from this hospitalization (including imaging, microbiology, ancillary and laboratory) are listed below for reference.      Microbiology: Recent Results (from the past 240 hour(s))  Wet prep, genital     Status: Abnormal   Collection Time: 06/10/16 10:50 AM  Result Value Ref Range Status   Yeast Wet Prep HPF POC NONE SEEN NONE SEEN Final   Trich, Wet Prep NONE SEEN NONE SEEN Final   Clue Cells Wet Prep HPF POC PRESENT (A) NONE SEEN Final   WBC, Wet Prep HPF POC MODERATE (A) NONE SEEN Final   Sperm NONE SEEN  Final     Labs: BNP (last 3 results) No results for input(s): BNP in the last 8760 hours. Basic Metabolic Panel:  Recent Labs Lab 06/10/16 1603  NA 141  K 3.8  CL 104  GLUCOSE 90  BUN 13  CREATININE 0.80   Liver Function Tests: No results for input(s): AST, ALT, ALKPHOS, BILITOT, PROT, ALBUMIN in the last 168 hours. No results for input(s): LIPASE, AMYLASE in the last 168 hours. No results for input(s): AMMONIA in the last 168 hours. CBC:  Recent Labs Lab 06/10/16 1535 06/10/16 1603  WBC 8.1  --   NEUTROABS 4.0  --   HGB 12.1 12.2  HCT 37.4 36.0  MCV 84.4  --   PLT 240  --    Cardiac Enzymes: No results for input(s): CKTOTAL, CKMB, CKMBINDEX, TROPONINI in the last 168 hours. BNP: Invalid input(s): POCBNP CBG: No results for input(s): GLUCAP in the last 168 hours. D-Dimer No results for input(s): DDIMER in the last 72 hours. Hgb A1c No results for input(s): HGBA1C in the last 72 hours. Lipid Profile No results for input(s): CHOL, HDL, LDLCALC, TRIG, CHOLHDL, LDLDIRECT in the last 72 hours. Thyroid function studies  Recent Labs  06/10/16 1922  TSH 5.349*   Anemia work up No results for input(s): VITAMINB12, FOLATE, FERRITIN, TIBC, IRON, RETICCTPCT in the last 72 hours. Urinalysis    Component Value Date/Time   COLORURINE YELLOW 06/10/2016 1021   APPEARANCEUR CLEAR 06/10/2016 1021   LABSPEC 1.010 06/10/2016 1021   PHURINE 6.0 06/10/2016 1021   GLUCOSEU NEGATIVE 06/10/2016 1021   HGBUR NEGATIVE 06/10/2016 1021   BILIRUBINUR NEGATIVE 06/10/2016 1021    KETONESUR NEGATIVE 06/10/2016 1021   PROTEINUR NEGATIVE 06/10/2016 1021   UROBILINOGEN 0.2 05/06/2013 1815   NITRITE NEGATIVE 06/10/2016 1021   LEUKOCYTESUR NEGATIVE 06/10/2016 1021   Sepsis Labs Invalid input(s): PROCALCITONIN,  WBC,  LACTICIDVEN Microbiology Recent Results (from the past 240 hour(s))  Wet prep, genital     Status: Abnormal   Collection Time: 06/10/16 10:50 AM  Result Value Ref Range Status   Yeast Wet Prep HPF POC NONE SEEN NONE SEEN Final  Trich, Wet Prep NONE SEEN NONE SEEN Final   Clue Cells Wet Prep HPF POC PRESENT (A) NONE SEEN Final   WBC, Wet Prep HPF POC MODERATE (A) NONE SEEN Final   Sperm NONE SEEN  Final     Time coordinating discharge: Over 30 minutes  SIGNED:   Jacquelin Hawking, MD [facility] 06/11/2016, 5:21 PM Pager [phone]  If 7PM-7AM, please contact night-coverage [email or web address] Password [email or web address]

## 2016-06-11 NOTE — Evaluation (Signed)
Physical Therapy Evaluation Patient Details Name: Paula Villegas MRN: [id number] DOB: 05-16-1975 Today's Date: 06/11/2016   History of Present Illness  Paula Villegas is a 41 y.o. female who presents with intractable back pain that is likely from muscle spasms vs early sciatica. PMH:  bradycardia, cholecystectomy, appendectomy, IUD migration.   Clinical Impression  Pt admitted with above diagnosis. Pt currently with functional limitations due to the deficits listed below (see PT Problem List). Pt was able to ambulate in hallway. Has a flight of stairs to practice and not ready for that today. Will follow acutely.  Pt will benefit from skilled PT to increase their independence and safety with mobility to allow discharge to the venue listed below.      Follow Up Recommendations No PT follow up;Supervision - Intermittent    Equipment Recommendations  Rolling walker with 5" wheels;3in1 (PT)    Recommendations for Other Services       Precautions / Restrictions Precautions Precautions: Fall Restrictions Weight Bearing Restrictions: No      Mobility  Bed Mobility Overal bed mobility: Needs Assistance Bed Mobility: Rolling;Sidelying to Sit;Sit to Sidelying Rolling: Min assist Sidelying to sit: Min assist     Sit to sidelying: Min assist General bed mobility comments: Needed assist to elevate trunk and to assist LEs back into bed due to pain.   Transfers Overall transfer level: Needs assistance Equipment used: Rolling walker (2 wheeled) Transfers: Sit to/from Stand Sit to Stand: Min guard         General transfer comment: cues for hand placement.  Incr time due to pain.   Ambulation/Gait Ambulation/Gait assistance: Min guard Ambulation Distance (Feet): 85 Feet Assistive device: Rolling walker (2 wheeled) Gait Pattern/deviations: Step-to pattern;Decreased step length - right;Decreased step length - left;Antalgic   Gait velocity interpretation: Below normal  speed for age/gender General Gait Details: Slow guarded gait due to back pain. Safe with RW but painful.   Stairs            Wheelchair Mobility    Modified Rankin (Stroke Patients Only)       Balance Overall balance assessment: Needs assistance;History of Falls Sitting-balance support: No upper extremity supported;Feet supported Sitting balance-Leahy Scale: Fair     Standing balance support: Bilateral upper extremity supported;During functional activity Standing balance-Leahy Scale: Poor Standing balance comment: relies on RW for support.                             Pertinent Vitals/Pain Pain Assessment: Faces Faces Pain Scale: Hurts even more Pain Location: back Pain Descriptors / Indicators: Aching;Sore Pain Intervention(s): Limited activity within patient's tolerance;Monitored during session;Premedicated before session;Repositioned  VSS    Home Living Family/patient expects to be discharged to:: Private residence Living Arrangements: Children;Other (Comment) (2 daughters and other family) Available Help at Discharge: Family;Available 24 hours/day Type of Home: House Home Access: Level entry     Home Layout: Two level;1/2 bath on main level Home Equipment: None      Prior Function Level of Independence: Independent               Hand Dominance        Extremity/Trunk Assessment   Upper Extremity Assessment Upper Extremity Assessment: Defer to OT evaluation    Lower Extremity Assessment Lower Extremity Assessment: Generalized weakness    Cervical / Trunk Assessment Cervical / Trunk Assessment: Normal  Communication   Communication: No difficulties  Cognition Arousal/Alertness: Awake/alert Behavior  During Therapy: Flat affect Overall Cognitive Status: Within Functional Limits for tasks assessed                      General Comments      Exercises     Assessment/Plan    PT Assessment Patient needs continued PT  services  PT Problem List Decreased activity tolerance;Decreased balance;Decreased mobility;Decreased safety awareness;Decreased knowledge of use of DME;Decreased knowledge of precautions;Pain;Decreased strength       PT Treatment Interventions DME instruction;Gait training;Stair training;Functional mobility training;Therapeutic activities;Therapeutic exercise;Balance training;Patient/family education    PT Goals (Current goals can be found in the Care Plan section)  Acute Rehab PT Goals Patient Stated Goal: to go home PT Goal Formulation: With patient Time For Goal Achievement: 06/18/16 Potential to Achieve Goals: Good    Frequency Min 3X/week   Barriers to discharge        Co-evaluation               End of Session Equipment Utilized During Treatment: Gait belt Activity Tolerance: Patient limited by fatigue;Patient limited by pain Patient left: with call bell/phone within reach;in bed;with family/visitor present Nurse Communication: Mobility status PT Visit Diagnosis: Unsteadiness on feet (R26.81);Muscle weakness (generalized) (M62.81)    Functional Assessment Tool Used: AM-PAC 6 Clicks Basic Mobility Functional Limitation: Mobility: Walking and moving around Mobility: Walking and Moving Around Current Status (R6045): At least 20 percent but less than 40 percent impaired, limited or restricted Mobility: Walking and Moving Around Goal Status [phone]): At least 1 percent but less than 20 percent impaired, limited or restricted    Time: 1141-1154 PT Time Calculation (min) (ACUTE ONLY): 13 min   Charges:   PT Evaluation $PT Eval Low Complexity: 1 Procedure     PT G Codes:   PT G-Codes **NOT FOR INPATIENT CLASS** Functional Assessment Tool Used: AM-PAC 6 Clicks Basic Mobility Functional Limitation: Mobility: Walking and moving around Mobility: Walking and Moving Around Current Status (X9147): At least 20 percent but less than 40 percent impaired, limited or  restricted Mobility: Walking and Moving Around Goal Status [phone]): At least 1 percent but less than 20 percent impaired, limited or restricted     Berline Lopes 06/11/2016, 1:59 PM [facility] Acute Rehabilitation [phone] (pager)

## 2016-06-12 NOTE — Care Management (Addendum)
CM contacted by bedside nurse late evening 06/11/16 - stating that pt would discharge yesterday but needed RW and 3:1 - bedside nurse stated pt would be able to get safely home without RW - informed pt that equipment would be delivered possibly next business day.  [facility]  [facility] - agency accepted referral order and will deliver equipment to pt today in the home.    Pt informed

## 2016-06-19 ENCOUNTER — Emergency Department (HOSPITAL_COMMUNITY)
Admission: EM | Admit: 2016-06-19 | Discharge: 2016-06-19 | Disposition: A | Discharge status: Home / Self Care | Payer: Self-pay | Attending: Emergency Medicine | Admitting: Emergency Medicine

## 2016-06-19 ENCOUNTER — Encounter (HOSPITAL_COMMUNITY): Payer: Self-pay | Admitting: Emergency Medicine

## 2016-06-19 ENCOUNTER — Emergency Department (HOSPITAL_COMMUNITY): Payer: Self-pay

## 2016-06-19 DIAGNOSIS — R51 Headache: Secondary | ICD-10-CM | POA: Insufficient documentation

## 2016-06-19 DIAGNOSIS — R519 Headache, unspecified: Secondary | ICD-10-CM

## 2016-06-19 MED ORDER — PROCHLORPERAZINE MALEATE 5 MG PO TABS
10.0000 mg | ORAL_TABLET | Freq: Once | ORAL | Status: AC
  Administered 2016-06-19: 10 mg via ORAL
  Filled 2016-06-19: qty 2

## 2016-06-19 MED ORDER — ACETAMINOPHEN 325 MG PO TABS
650.0000 mg | ORAL_TABLET | Freq: Once | ORAL | Status: AC
  Administered 2016-06-19: 650 mg via ORAL
  Filled 2016-06-19: qty 2

## 2016-06-19 NOTE — ED Provider Notes (Signed)
[facility] DEPT Provider Note   CSN: [id number] Arrival date & time: 06/19/16  1211     History   Chief Complaint Chief Complaint  Patient presents with  . Headache    HPI ICIS Paula Villegas is a 41 y.o. female. 41 year old female presents today complaining of headache. She states that it began gradually 2 days ago. It is bitemporal in nature. It is tight. She does not normally have headaches. She was recently seen and admitted to the hospital for back pain. She states her back pain has improved. She has been taking meds as what she was discharged on including steroids and Robaxin. She states that she is stopping taking these because she does not feel that they are making it worse Denies head injury. Denies sudden onset of headache, Denies anticoagulation Denies fever, neck pain. Notes vision blurriness Denies lateralized weakness, difficulty walking or speaking.  Patient took ibuprofen without relief.  HPI     Past Medical History:  Diagnosis Date  . Abnormal vaginal bleeding   . Bradycardia   . BV (bacterial vaginosis)   . GERD (gastroesophageal reflux disease)   . IUD migration (HCC)   . Ovarian cyst 2014  . Preterm labor    PTL x2, PTD x1    Patient Active Problem List   Diagnosis Date Noted  . Intractable back pain 06/10/2016  . BV (bacterial vaginosis) 06/10/2016  . Abnormal uterine bleeding 06/10/2016  . Bradycardia 06/10/2016  . Sciatica of right side   . Ovarian cyst 03/23/2012  . Appendicitis 05/22/2011  . 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 ANEMIA-NOS 12/14/2008  . BILIARY COLIC 12/14/2008    Past Surgical History:  Procedure Laterality Date  . APPENDECTOMY  2014   Laparoscopic  . INTRAUTERINE DEVICE INSERTION  2015  . LAPAROSCOPIC CHOLECYSTECTOMY  2012    OB History    Gravida Para Term Preterm AB Living   SAB TAB Ectopic Multiple Live Births   1       4       Home Medications    Prior to Admission medications     Medication Sig Start Date End Date Taking? Authorizing Provider  diclofenac (VOLTAREN) 75 MG EC tablet Take 1 tablet (75 mg total) by mouth 2 (two) times daily. 06/11/16   Narda Bonds, MD  diclofenac sodium (VOLTAREN) 1 % GEL Apply 4 g topically 4 (four) times daily as needed (back pain). 06/11/16   Narda Bonds, MD  HYDROcodone-acetaminophen (NORCO/VICODIN) 5-325 MG tablet Take 1-2 tablets by mouth every 4 (four) hours as needed for moderate pain. 06/11/16   Narda Bonds, MD  methocarbamol (ROBAXIN) 500 MG tablet Take 2 tablets (1,000 mg total) by mouth 3 (three) times daily. 06/11/16   Narda Bonds, MD  PARAGARD INTRAUTERINE COPPER IUD IUD 1 each by Intrauterine route once. Placed 07-04-2013 at [facility]. Instructed to remove no later than 03/2023    Historical Provider, MD  polyethylene glycol (MIRALAX) packet Take 17 g by mouth daily. 06/11/16   Narda Bonds, MD  predniSONE (DELTASONE) 20 MG tablet Take  x 3 days,  x 3 days,  x 4 days  x4 days 06/11/16   Narda Bonds, MD    Family History Family History  Problem Relation Age of Onset  . Hypertension Mother   . Cancer Mother     uterine  . Asthma Brother   . Diabetes Brother   . Diabetes Father   .  Alcohol abuse Father   . Seizures Brother 34    post traumatic brain injury --pedestrian hit by car  . Hearing loss Neg Hx     Social History Social History  Substance Use Topics  . Smoking status: Never Smoker  . Smokeless tobacco: Never Used  . Alcohol use Yes     Comment: 06/10/2016 "I'll have a couple drinks/month"     Allergies   Patient has no known allergies.   Review of Systems Review of Systems  All other systems reviewed and are negative.    Physical Exam Updated Vital Signs BP 122/80   Pulse 67   Temp 98.5 F (36.9 C) (Oral)   Resp 18   LMP 05/22/2016 (Exact Date)   SpO2 100%   Physical Exam  Constitutional: She is oriented to person, place, and time. She appears well-developed and  well-nourished. No distress.  HENT:  Head: Normocephalic and atraumatic.  Right Ear: External ear normal.  Left Ear: External ear normal.  Nose: Nose normal.  Mouth/Throat: Oropharynx is clear and moist.  Eyes: Conjunctivae and EOM are normal. Pupils are equal, round, and reactive to light.  Neck: Normal range of motion. Neck supple.  Cardiovascular: Normal rate, regular rhythm, normal heart sounds and intact distal pulses.   Pulmonary/Chest: Effort normal and breath sounds normal.  Abdominal: Soft. Bowel sounds are normal.  Musculoskeletal: Normal range of motion.  Neurological: She is alert and oriented to person, place, and time. She displays normal reflexes. No cranial nerve deficit. She exhibits normal muscle tone. Coordination normal.  Skin: Skin is warm and dry.  Psychiatric: She has a normal mood and affect. Her behavior is normal. Thought content normal.  Nursing note and vitals reviewed.    ED Treatments / Results  Labs (all labs ordered are listed, but only abnormal results are displayed) Labs Reviewed - No data to display  EKG  EKG Interpretation None       Radiology No results found.  Procedures Procedures (including critical care time)  Medications Ordered in ED Medications  prochlorperazine (COMPAZINE) tablet 10 mg (10 mg Oral Given 06/19/16 1531)  acetaminophen (TYLENOL) tablet 650 mg (650 mg Oral Given 06/19/16 1531)     Initial Impression / Assessment and Plan / ED Course  I have reviewed the triage vital signs and the nursing notes.  Pertinent labs & imaging results that were available during my care of the patient were reviewed by me and considered in my medical decision making (see chart for details).   41 year old female with gradual onset of headache bitemporal in nature which is severe. She has no neurological signs. I discussed that I do not think CT scanning is indicated at this time. Plan oral pain medication and will discharge if  improved  Final Clinical Impressions(s) / ED Diagnoses   Final diagnoses:  Acute nonintractable headache, unspecified headache type    New Prescriptions New Prescriptions   No medications on file     Margarita Grizzle, MD 06/23/16 1948

## 2016-06-19 NOTE — ED Notes (Signed)
Pt c/o HA 9/10 pain x 2 days.  HA is located in the frontal lobe.  Pt also c/o mild tingling on both sides of her face. Neuro was WDL. Pt was recently seen last week for back pain and was wondering if the Mx might have caused the HA.

## 2016-06-19 NOTE — ED Triage Notes (Signed)
Pt reports headache and blurred vision since Monday, recently started on prednisone and diclofenac, states headache began after she started meds. Pt a/ox4, resp e/u, no neuro deficits noted.

## 2016-06-19 NOTE — ED Notes (Signed)
Pt c/o of head feeling very heavy.

## 2016-11-21 DIAGNOSIS — R768 Other specified abnormal immunological findings in serum: Secondary | ICD-10-CM | POA: Insufficient documentation

## 2017-06-14 ENCOUNTER — Ambulatory Visit (INDEPENDENT_AMBULATORY_CARE_PROVIDER_SITE_OTHER): Payer: Self-pay | Admitting: Internal Medicine

## 2017-06-14 ENCOUNTER — Encounter: Payer: Self-pay | Admitting: Internal Medicine

## 2017-06-14 VITALS — BP 100/68 | HR 62 | Resp 14 | Ht 61.0 in | Wt 145.0 lb

## 2017-06-14 DIAGNOSIS — H1132 Conjunctival hemorrhage, left eye: Secondary | ICD-10-CM

## 2017-06-14 NOTE — Progress Notes (Signed)
   Subjective:    Patient ID: Paula Villegas, female    DOB: [birth year], 42 y.o.   MRN: [id number]  HPI   Left eye with red area that is more prominent this morning.  Started yesterday.  No congestion, itching of eyes, nose, throat.  No cough.  No episode she can recall with increased pressure to chest or face/head.  Current Meds  Medication Sig  . PARAGARD INTRAUTERINE COPPER IUD IUD 1 each by Intrauterine route once. [facility]. Instructed to remove no later than 03/2023       Review of Systems     Objective:   Physical Exam   NAD HEENT:  PERRL, EOMI, small hemorrhage of conjunctivae, temporal left eye abutting the iris. Discs sharp bilaterally, TMs pearly gray, throat without injection. Neck:  Supple, No adenopathy Chest:  CTA CV:  RRR without murmur or rub, radial and DP pulses normal and equal           Assessment & Plan:  Subconjunctival Hemorrhage, temporal left eye.  Avoid pressure to face, keep head elevated above shoulders.  Call if any new symptoms.

## 2017-07-06 ENCOUNTER — Ambulatory Visit: Payer: Self-pay | Admitting: Internal Medicine

## 2017-07-06 ENCOUNTER — Encounter: Payer: Self-pay | Admitting: Internal Medicine

## 2017-07-06 VITALS — BP 118/80 | HR 64 | Resp 12 | Ht 61.0 in | Wt 143.0 lb

## 2017-07-06 DIAGNOSIS — K0889 Other specified disorders of teeth and supporting structures: Secondary | ICD-10-CM

## 2017-07-06 DIAGNOSIS — Z1239 Encounter for other screening for malignant neoplasm of breast: Secondary | ICD-10-CM

## 2017-07-06 DIAGNOSIS — Z124 Encounter for screening for malignant neoplasm of cervix: Secondary | ICD-10-CM

## 2017-07-06 DIAGNOSIS — Z1231 Encounter for screening mammogram for malignant neoplasm of breast: Secondary | ICD-10-CM

## 2017-07-06 DIAGNOSIS — Z0000 Encounter for general adult medical examination without abnormal findings: Secondary | ICD-10-CM

## 2017-07-06 DIAGNOSIS — B351 Tinea unguium: Secondary | ICD-10-CM

## 2017-07-06 DIAGNOSIS — N898 Other specified noninflammatory disorders of vagina: Secondary | ICD-10-CM

## 2017-07-06 LAB — POCT WET PREP WITH KOH
KOH Prep POC: NEGATIVE
Trichomonas, UA: NEGATIVE
YEAST WET PREP PER HPF POC: NEGATIVE

## 2017-07-06 MED ORDER — METRONIDAZOLE 500 MG PO TABS: ORAL_TABLET | ORAL | 0 refills | Status: DC

## 2017-07-06 NOTE — Patient Instructions (Addendum)
Don't start Terbinafine until 2 days after you finish the Metronidazole for BV  For foot and toenail fungus:  Spray your shoes with Lysol or Lotrimin Antifungal spray when you start the medication for your feet.   Re-spray your the shoes you wear with each wearing and allow to dry before wearing again You will need to continue to spray the shoes you have during the infection of your toenails and feet have been thrown out due to wear.  Once your toenails and feet are clear of infection, the shoes you buy new do not necessarily need to be sprayed. Clean your shower floor once to twice daily with bleach containing cleaner.

## 2017-07-06 NOTE — Progress Notes (Signed)
Subjective:    Patient ID: Paula Villegas, female    DOB: 03-31-1975, 42 y.o.   MRN: [id number]  HPI   CPE with pap  1.  Pap:  Last pap 5 years ago.  Always normal.  Mother with history of cervical cancer diagnosed age 44.    2.  Mammogram:  Only mammogram was in 2011 and showed with repeat images no concern.  Had ultrasound of left breast following first images.  No family history of breast cancer.    3.  Osteoprevention:  Not much dairy intake daily.  Is on her feet with cleaning businesses.    4.  Guaiac Cards:  Never.    5.  Colonoscopy:  Never.  No family history of colon cancer.   6.  Immunizations:  Thinks she may have had a Tdap 4 years ago during pregnancy with her daughter.  Immunization History  Administered Date(s) Administered  . Td 12/13/2001    7.  Glucose/Cholesterol:  Glucose last year was normal.  Has not had cholesterol check.  Current Meds  Medication Sig  . PARAGARD INTRAUTERINE COPPER IUD IUD 1 each by Intrauterine route once. [facility]. Instructed to remove no later than 03/2023    No Known Allergies   Past Medical History:  Diagnosis Date  . Abnormal vaginal bleeding   . Bradycardia   . BV (bacterial vaginosis)   . GERD (gastroesophageal reflux disease)   . IUD migration (HCC)   . Ovarian cyst 2014  . Preterm labor    PTL x2, PTD x1    Past Surgical History:  Procedure Laterality Date  . APPENDECTOMY  2014   Laparoscopic  . INTRAUTERINE DEVICE INSERTION  2015  . LAPAROSCOPIC CHOLECYSTECTOMY  2012   Family History  Problem Relation Age of Onset  . Hypertension Mother   . Cancer Mother 245       cervical  . Heart disease Mother        Died today, 07/06/2017 when patient in for CPE--MI related to alcohol abuse.  . Asthma Brother   . Diabetes Brother   . Diabetes Father   . Alcohol abuse Father   . Heart disease Father   . Seizures Brother 34       post traumatic brain injury --pedestrian hit by car  .  Hearing loss Neg Hx     Social History   Socioeconomic History  . Marital status: Divorced    Spouse name: Not on file  . Number of children: 4  . Years of education: 1211  . Highest education level: Not on file  Occupational History  . Occupation: Designer, multimediaBusiness cleaning business    Comment: previously [employer] [place].  Social Needs  . Financial resource strain: Not on file  . Food insecurity:    Worry: Never true    Inability: Never true  . Transportation needs:    Medical: No    Non-medical: No  Tobacco Use  . Smoking status: Never Smoker  . Smokeless tobacco: Never Used  Substance and Sexual Activity  . Alcohol use: Yes    Comment: 06/2017:  couple of drinks monthly  . Drug use: No  . Sexual activity: Yes    Birth control/protection: IUD    Comment: Paragard Copper T,  Good until 03/2023, placed 07/04/2013  Lifestyle  . Physical activity:    Days per week: Not on file    Minutes per session: Not on file  . Stress: Not on  file  Relationships  . Social connections:    Talks on phone: Not on file    Gets together: Not on file    Attends religious service: Never    Active member of club or organization: No    Attends meetings of clubs or organizations: Never    Relationship status: Not on file  . Intimate partner violence:    Fear of current or ex partner: No    Emotionally abused: No    Physically abused: No    Forced sexual activity: No  Other Topics Concern  . Not on file  Social History Narrative   [place]   Came to [employer] In [place]   Lives with middle and youngest daughter live with her now.  A son will be living with her soon as well.   Father of children lives in [place], they are no longer a couple.   Older children live on their own now.   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       Review of Systems    Constitutional: Negative for appetite change, fatigue and fever.  HENT: Positive for dental problem (left lower molar.  Filling fell out), sneezing and sore throat (more of a tickle, better with Claritin.). Negative for ear pain (sometimes pain when boyfriend speaks--booming noise with his voice.Marland Kitchen only in left ear. ).   Eyes: Positive for discharge (some watery dishcharge) and itching.  Respiratory: Negative for shortness of breath.   Cardiovascular: Negative for chest pain, palpitations and leg swelling.  Gastrointestinal: Positive for abdominal pain (lower mid abdomen.  only before period.). Negative for blood in stool (no melena), constipation and diarrhea.  Genitourinary: Positive for vaginal discharge (with odor as her BV has been in past.). Negative for dysuria.  Neurological: Negative for weakness and numbness.  Psychiatric/Behavioral: Negative for dysphoric mood. The patient is not nervous/anxious.        Objective:   Physical Exam  Constitutional: She is oriented to person, place, and time. She appears well-developed and well-nourished.  HENT:  Head: Normocephalic and atraumatic.  Right Ear: Hearing, tympanic membrane, external ear and ear canal normal.  Left Ear: Hearing, tympanic membrane, external ear and ear canal normal.  Nose: Nose normal.  Mouth/Throat: Uvula is midline, oropharynx is clear and moist and mucous membranes are normal.  Molar with missing filling, left lower jaw  Eyes: Pupils are equal, round, and reactive to light. Conjunctivae and EOM are normal.  Discs sharp bilaterally  Neck: Normal range of motion and full passive range of motion without pain. Neck supple. No thyromegaly present.  Cardiovascular: Normal rate, regular rhythm, S1 normal and S2 normal. Exam reveals no S3, no S4 and no friction rub.  No murmur heard. No carotid bruits.  Carotid, radial, femoral, DP and PT pulses normal and equal.   Pulmonary/Chest: Effort normal and breath sounds normal.  Right breast exhibits no inverted nipple, no mass, no nipple discharge, no skin change and no tenderness. Left breast exhibits no inverted nipple, no mass, no nipple discharge, no skin change and no tenderness.  Abdominal: Soft. Bowel sounds are normal. She exhibits no mass. There is no hepatosplenomegaly. There is no tenderness. No hernia.  Genitourinary: Rectal exam shows guaiac positive stool. Rectal exam shows no mass and no tenderness.  Genitourinary Comments: Normal external genitalia.  Yellow white vaginal discharge. No  underlying erythema of cervical or vaginal mucosa.   No uterine or adnexal mass or tenderness.   Pap taken.  Musculoskeletal: Normal range of motion.  Lymphadenopathy:       Head (right side): No submental and no submandibular adenopathy present.       Head (left side): No submental and no submandibular adenopathy present.    She has no cervical adenopathy.    She has no axillary adenopathy.       Right: No inguinal and no supraclavicular adenopathy present.       Left: No inguinal and no supraclavicular adenopathy present.  Neurological: She is alert and oriented to person, place, and time. She has normal strength and normal reflexes. No cranial nerve deficit or sensory deficit. Coordination and gait normal.  Skin: Skin is warm and dry.  Thickened discolored toenails with crumbling.  Psychiatric: She has a normal mood and affect. Her speech is normal and behavior is normal. Judgment and thought content normal. Cognition and memory are normal.     Wet prep:  + clue cells with whiff     Assessment & Plan:  1.  CPE with pap Schedule mammogram  2.  BV:  Metronidazole 500 mg twice daily for 7 days.  3.  Toenail onychomycosis:  To start Terbinafine 250 mg daily for 84 days after completes Metronidazole.  4.  Grief:  Tearful for short period of time as shared she found out her father died this morning.  Unexpectedly.  She will be unable to go home to be with her  family.  Will ask Samul Dada, LCSW to contact her to check in on grief counseling.

## 2017-07-08 LAB — CYTOLOGY - PAP

## 2017-07-13 ENCOUNTER — Other Ambulatory Visit: Payer: Self-pay

## 2017-07-13 DIAGNOSIS — Z13220 Encounter for screening for lipoid disorders: Secondary | ICD-10-CM

## 2017-07-13 DIAGNOSIS — R7989 Other specified abnormal findings of blood chemistry: Secondary | ICD-10-CM

## 2017-07-13 DIAGNOSIS — Z79899 Other long term (current) drug therapy: Secondary | ICD-10-CM

## 2017-07-14 LAB — COMPREHENSIVE METABOLIC PANEL
A/G RATIO: 1.6 (ref 1.2–2.2)
ALK PHOS: 69 IU/L (ref 39–117)
ALT: 18 IU/L (ref 0–32)
AST: 20 IU/L (ref 0–40)
Albumin: 4.2 g/dL (ref 3.5–5.5)
BILIRUBIN TOTAL: 0.5 mg/dL (ref 0.0–1.2)
BUN/Creatinine Ratio: 11 (ref 9–23)
BUN: 9 mg/dL (ref 6–24)
CHLORIDE: 103 mmol/L (ref 96–106)
CO2: 24 mmol/L (ref 20–29)
Calcium: 8.7 mg/dL (ref 8.7–10.2)
Creatinine, Ser: 0.8 mg/dL (ref 0.57–1.00)
GFR calc non Af Amer: 91 mL/min/{1.73_m2} (ref >59)
GFR, EST AFRICAN AMERICAN: 104 mL/min/{1.73_m2} (ref >59)
Globulin, Total: 2.7 g/dL (ref 1.5–4.5)
Glucose: 90 mg/dL (ref 65–99)
Potassium: 4.5 mmol/L (ref 3.5–5.2)
Sodium: 140 mmol/L (ref 134–144)
TOTAL PROTEIN: 6.9 g/dL (ref 6.0–8.5)

## 2017-07-14 LAB — LIPID PANEL W/O CHOL/HDL RATIO
Cholesterol, Total: 168 mg/dL (ref 100–199)
HDL: 51 mg/dL (ref >39)
LDL CALC: 101 mg/dL — AB (ref 0–99)
TRIGLYCERIDES: 78 mg/dL (ref 0–149)
VLDL CHOLESTEROL CAL: 16 mg/dL (ref 5–40)

## 2017-07-14 LAB — TSH: TSH: 6.29 u[IU]/mL — AB (ref 0.450–4.500)

## 2017-07-26 ENCOUNTER — Other Ambulatory Visit (INDEPENDENT_AMBULATORY_CARE_PROVIDER_SITE_OTHER): Payer: Self-pay

## 2017-07-26 DIAGNOSIS — Z1211 Encounter for screening for malignant neoplasm of colon: Secondary | ICD-10-CM

## 2017-07-26 LAB — POC HEMOCCULT BLD/STL (HOME/3-CARD/SCREEN)
Card #2 Fecal Occult Blod, POC: NEGATIVE
FECAL OCCULT BLD: NEGATIVE
Fecal Occult Blood, POC: NEGATIVE

## 2017-07-28 ENCOUNTER — Other Ambulatory Visit: Payer: Self-pay

## 2017-07-30 ENCOUNTER — Other Ambulatory Visit: Payer: Self-pay

## 2017-07-30 DIAGNOSIS — R7989 Other specified abnormal findings of blood chemistry: Secondary | ICD-10-CM

## 2017-07-31 LAB — T4, FREE: FREE T4: 0.9 ng/dL (ref 0.82–1.77)

## 2017-08-04 ENCOUNTER — Other Ambulatory Visit: Payer: Self-pay | Admitting: Licensed Clinical Social Worker

## 2017-08-04 ENCOUNTER — Telehealth: Payer: Self-pay | Admitting: Internal Medicine

## 2017-08-04 NOTE — Telephone Encounter (Signed)
Britt Boozer called patient to informed regarding lab results and future lab appointment pt. Needs to have done; after patient was informed stated needs to cancel two appointments that were scheduled to follow up toenails due to Patient unable to start medication because never received Rx coupon to get it at lower cost.  Please  Advise.

## 2017-08-04 NOTE — Telephone Encounter (Signed)
Spoke with Britt Boozer and patient. To clarify patient needs coupon for terbinafine to get medication. Patient states she never received the coupon.

## 2017-08-04 NOTE — Telephone Encounter (Signed)
To Dr. Mulberry  

## 2017-08-06 NOTE — Telephone Encounter (Signed)
I think we are talking about another patient here.  I do not have her diagnosed with fungal toenail infection, nor a prescription for Terbinafine.  Please check to make sure you have the correct patient.  Unless I am missing a phone chain message somewhere.

## 2017-08-10 ENCOUNTER — Other Ambulatory Visit: Payer: Self-pay

## 2017-08-10 MED ORDER — TERBINAFINE HCL 250 MG PO TABS: 250.0000 mg | ORAL_TABLET | Freq: Every day | ORAL | 0 refills | Status: DC

## 2017-08-10 NOTE — Telephone Encounter (Signed)
Rx sent to [facility] and given to patient for terbinafine

## 2017-08-11 ENCOUNTER — Ambulatory Visit
Admission: RE | Admit: 2017-08-11 | Discharge: 2017-08-11 | Disposition: A | Discharge status: Home / Self Care | Payer: No Typology Code available for payment source | Source: Ambulatory Visit | Attending: Internal Medicine | Admitting: Internal Medicine

## 2017-08-11 DIAGNOSIS — Z1239 Encounter for other screening for malignant neoplasm of breast: Secondary | ICD-10-CM

## 2017-08-23 ENCOUNTER — Ambulatory Visit: Payer: Self-pay | Admitting: Licensed Clinical Social Worker

## 2017-08-23 DIAGNOSIS — Z634 Disappearance and death of family member: Secondary | ICD-10-CM
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
## 2017-09-07 ENCOUNTER — Ambulatory Visit: Payer: Self-pay | Admitting: Internal Medicine

## 2017-09-10 ENCOUNTER — Ambulatory Visit: Payer: Self-pay | Admitting: Licensed Clinical Social Worker

## 2017-09-10 DIAGNOSIS — Z638 Other specified problems related to primary support group: Secondary | ICD-10-CM
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
## 2017-09-24 ENCOUNTER — Other Ambulatory Visit: Payer: Self-pay | Admitting: Licensed Clinical Social Worker

## 2017-09-29 ENCOUNTER — Ambulatory Visit: Payer: Self-pay | Admitting: Licensed Clinical Social Worker

## 2017-09-29 DIAGNOSIS — Z634 Disappearance and death of family member: Secondary | ICD-10-CM

## 2017-10-05 ENCOUNTER — Other Ambulatory Visit: Payer: Self-pay

## 2017-10-05 DIAGNOSIS — E039 Hypothyroidism, unspecified: Secondary | ICD-10-CM

## 2017-10-06 LAB — T4, FREE: Free T4: 0.94 ng/dL (ref 0.82–1.77)

## 2017-10-06 LAB — TSH: TSH: 6.41 u[IU]/mL — ABNORMAL HIGH (ref 0.450–4.500)
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
## 2017-10-07 MED ORDER — LEVOTHYROXINE SODIUM 25 MCG PO TABS: 25.0000 ug | ORAL_TABLET | Freq: Every day | ORAL | 11 refills | Status: DC

## 2017-10-07 NOTE — Addendum Note (Signed)
Addended by: Marcene DuosMULBERRY, Raelin Pixler M on: 10/07/2017 08:46 AM   Modules accepted: Orders

## 2017-10-11 ENCOUNTER — Other Ambulatory Visit: Payer: Self-pay | Admitting: Licensed Clinical Social Worker

## 2017-10-14 ENCOUNTER — Ambulatory Visit: Payer: Self-pay | Admitting: Licensed Clinical Social Worker

## 2017-10-14 DIAGNOSIS — R4581 Low self-esteem: Secondary | ICD-10-CM

## 2017-10-14 DIAGNOSIS — Z634 Disappearance and death of family member: Secondary | ICD-10-CM
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
## 2017-10-19 ENCOUNTER — Ambulatory Visit: Payer: Self-pay | Admitting: Internal Medicine

## 2017-10-27 ENCOUNTER — Ambulatory Visit: Payer: Self-pay | Admitting: Licensed Clinical Social Worker

## 2017-10-27 DIAGNOSIS — R4581 Low self-esteem: Secondary | ICD-10-CM

## 2017-10-27 DIAGNOSIS — Z634 Disappearance and death of family member: Secondary | ICD-10-CM
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
## 2017-11-10 ENCOUNTER — Other Ambulatory Visit: Payer: Self-pay | Admitting: Licensed Clinical Social Worker

## 2017-11-16 ENCOUNTER — Other Ambulatory Visit: Payer: Self-pay

## 2017-11-16 DIAGNOSIS — E039 Hypothyroidism, unspecified: Secondary | ICD-10-CM

## 2017-11-17 LAB — TSH: TSH: 4.62 u[IU]/mL — ABNORMAL HIGH (ref 0.450–4.500)

## 2017-11-17 LAB — T4, FREE: Free T4: 1.12 ng/dL (ref 0.82–1.77)

## 2017-11-18 ENCOUNTER — Other Ambulatory Visit: Payer: Self-pay

## 2017-11-24 ENCOUNTER — Ambulatory Visit: Payer: Self-pay | Admitting: Licensed Clinical Social Worker

## 2017-11-24 DIAGNOSIS — R4581 Low self-esteem: Secondary | ICD-10-CM

## 2017-11-24 DIAGNOSIS — Z638 Other specified problems related to primary support group: Secondary | ICD-10-CM
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
## 2017-12-08 ENCOUNTER — Ambulatory Visit: Payer: Self-pay | Admitting: Licensed Clinical Social Worker

## 2017-12-08 DIAGNOSIS — R4581 Low self-esteem: Secondary | ICD-10-CM

## 2017-12-08 DIAGNOSIS — Z638 Other specified problems related to primary support group: Secondary | ICD-10-CM

## 2017-12-14 ENCOUNTER — Encounter: Payer: Self-pay | Admitting: Internal Medicine

## 2017-12-14 ENCOUNTER — Ambulatory Visit: Payer: Self-pay | Admitting: Internal Medicine

## 2017-12-14 VITALS — BP 102/78 | HR 66 | Resp 12 | Ht 61.0 in | Wt 142.0 lb

## 2017-12-14 DIAGNOSIS — R109 Unspecified abdominal pain: Secondary | ICD-10-CM

## 2017-12-14 DIAGNOSIS — B351 Tinea unguium: Secondary | ICD-10-CM

## 2017-12-14 DIAGNOSIS — E039 Hypothyroidism, unspecified: Secondary | ICD-10-CM

## 2017-12-14 LAB — POCT URINALYSIS DIPSTICK
Bilirubin, UA: NEGATIVE
Glucose, UA: NEGATIVE
Ketones, UA: NEGATIVE
NITRITE UA: NEGATIVE
PROTEIN UA: NEGATIVE
Spec Grav, UA: <=1.005 — AB (ref 1.010–1.025)
Urobilinogen, UA: 0.2 E.U./dL
pH, UA: 6 (ref 5.0–8.0)

## 2017-12-14 NOTE — Progress Notes (Signed)
   Subjective:    Patient ID: Paula Villegas, female    DOB: May 13, 1975, 42 y.o.   MRN: [id number]  HPI   About 10 days ago, began having pain in right upper quadrant/flank area.  Difficulty describing, but seemed cramping perhaps.   Discomfort would come and go.   Pain could last up to 2 hours, but is throughout the day. Pain is now less severe when she has it and not lasting as long, but still having the discomfort. No change in pain with eating, BM, or urination.   BMs normal and formed but soft.  No melena or hematochezia. No diarrhea.  No dysuria or hematuria. No nausea or vomiting. She did note the pain being worse with bending forward.   She does not recall any injury or overuse prior to onset of pain.   Has not taken anything for the pain She has a history of laparoscopic cholecystectomy. She   Current Meds  Medication Sig  . levothyroxine (SYNTHROID, LEVOTHROID) 25 MCG tablet Take 1 tablet (25 mcg total) by mouth daily before breakfast. (Patient taking differently: Take 25 mcg by mouth. 2 daily in the am 30 minutes before breakfast)  . PARAGARD INTRAUTERINE COPPER IUD IUD 1 each by Intrauterine route once. [facility] Instructed to remove no later than 03/2023    No Known Allergies    Review of Systems     Objective:   Physical Exam NAD Lungs:  CTA CV:  RRR without murmur or rub.  Radial pulses normal and equal Abd:  S, + BS.  Tender somewhat on right flank area and right abdomen laterally at umbilical level.  No Murphy's sign.  No lower quadrant tenderness on right. No rebound or peritoneal sign.  No HSM or mass. Toenails with nail polish.  Patient states they are normal at base, but appear thickened      Assessment & Plan:  1.  Right abdominal pain:  Suspect muscular pain.  Check CBC, CMP, UA (though started period today).  Ibuprofen as needed. Urine sent for culture as unable to assess leuks in urine with period starting today. To call if  new symptoms  2.  Hypothyroidism:  TSH.  Currently taking 50 mcg Levothyroxine.  3.  Toenail onychomycosis: not clear why she did not follow up with toenail fungus.  Appears she still has significant thickening of nails, but painted so hard to see if improved. To leave off nail polish with next visit to better assess.
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
## 2017-12-15 ENCOUNTER — Other Ambulatory Visit: Payer: Self-pay

## 2017-12-15 LAB — CBC WITH DIFFERENTIAL/PLATELET
BASOS ABS: 0 10*3/uL (ref 0.0–0.2)
BASOS: 1 %
EOS (ABSOLUTE): 0 10*3/uL (ref 0.0–0.4)
Eos: 1 %
Hematocrit: 37.3 % (ref 34.0–46.6)
Hemoglobin: 11.9 g/dL (ref 11.1–15.9)
IMMATURE GRANS (ABS): 0 10*3/uL (ref 0.0–0.1)
Immature Granulocytes: 0 %
LYMPHS ABS: 1.9 10*3/uL (ref 0.7–3.1)
LYMPHS: 37 %
MCH: 26.8 pg (ref 26.6–33.0)
MCHC: 31.9 g/dL (ref 31.5–35.7)
MCV: 84 fL (ref 79–97)
Monocytes Absolute: 0.4 10*3/uL (ref 0.1–0.9)
Monocytes: 9 %
NEUTROS ABS: 2.7 10*3/uL (ref 1.4–7.0)
Neutrophils: 52 %
PLATELETS: 323 10*3/uL (ref 150–450)
RBC: 4.44 x10E6/uL (ref 3.77–5.28)
RDW: 15.2 % (ref 12.3–15.4)
WBC: 5.2 10*3/uL (ref 3.4–10.8)

## 2017-12-15 LAB — COMPREHENSIVE METABOLIC PANEL
ALT: 9 IU/L (ref 0–32)
AST: 14 IU/L (ref 0–40)
Albumin/Globulin Ratio: 1.7 (ref 1.2–2.2)
Albumin: 4.2 g/dL (ref 3.5–5.5)
Alkaline Phosphatase: 68 IU/L (ref 39–117)
BILIRUBIN TOTAL: 0.6 mg/dL (ref 0.0–1.2)
BUN/Creatinine Ratio: 13 (ref 9–23)
BUN: 10 mg/dL (ref 6–24)
CALCIUM: 8.6 mg/dL — AB (ref 8.7–10.2)
CHLORIDE: 105 mmol/L (ref 96–106)
CO2: 22 mmol/L (ref 20–29)
Creatinine, Ser: 0.79 mg/dL (ref 0.57–1.00)
GFR calc non Af Amer: 92 mL/min/{1.73_m2} (ref >59)
GFR, EST AFRICAN AMERICAN: 106 mL/min/{1.73_m2} (ref >59)
GLUCOSE: 75 mg/dL (ref 65–99)
Globulin, Total: 2.5 g/dL (ref 1.5–4.5)
Potassium: 4.6 mmol/L (ref 3.5–5.2)
Sodium: 140 mmol/L (ref 134–144)
Total Protein: 6.7 g/dL (ref 6.0–8.5)

## 2017-12-15 LAB — TSH: TSH: 2.64 u[IU]/mL (ref 0.450–4.500)

## 2017-12-16 LAB — URINE CULTURE: ORGANISM ID, BACTERIA: NO GROWTH

## 2017-12-22 ENCOUNTER — Ambulatory Visit: Payer: Self-pay | Admitting: Licensed Clinical Social Worker

## 2017-12-22 DIAGNOSIS — Z638 Other specified problems related to primary support group: Secondary | ICD-10-CM

## 2017-12-24 NOTE — Progress Notes (Signed)
   THERAPY PROGRESS NOTE  Session Time:  Participation Level: Active  Behavioral Response: Neat and Well GroomedAlertEuthymic  Type of Therapy: Individual Therapy  Treatment Goals addressed: Coping  Interventions: Strength-based and Supportive  Summary: Paula Villegas is a 42 y.o. female who presents with a euthymic mood and appropriate affect. She reported that she has been feeling very stressed about her relationship with her partner. She shared about their recent arguments regarding his behavior that has become more controlling. She expressed her ambivalence about whether or not she wants to stay with him. She shared that he rarely contributes to any household expenses, as he prioritizes financially supporting his children and mother. She shared about some of her other stressors, all of which revolve around her family.  Suicidal/Homicidal: Nowithout intent/plan  Therapist Response: LCSW utilized supportive counseling techniques throughout the session in order to validate emotions and encourage open expression of emotion. LCSW and Paula Villegas processed about her marital problems and how she is coping with them. LCSW encouraged Paula Villegas to think carefully about her needs within the relationship and how to communicate those openly with her partner.  Plan: Return again in 2 weeks.   Paula Simmer, LCSW 12/24/2017

## 2018-01-07 ENCOUNTER — Ambulatory Visit: Payer: Self-pay | Admitting: Licensed Clinical Social Worker

## 2018-01-07 DIAGNOSIS — Z638 Other specified problems related to primary support group: Secondary | ICD-10-CM

## 2018-01-07 DIAGNOSIS — Z634 Disappearance and death of family member: Secondary | ICD-10-CM

## 2018-01-10 NOTE — Progress Notes (Signed)
   THERAPY PROGRESS NOTE  Session Time:  Participation Level: Active  Behavioral Response: CasualAlertEuthymic  Type of Therapy: Individual Therapy  Treatment Goals addressed: Coping  Interventions: Strength-based and Supportive  Summary: Paula Villegas is a 42 y.o. female who presents with a euthymic mood and appropriate affect. She reported that "things are about the same." She shared that she feels the stress with her adult son is intolerable, as he refuses to contribute to household chores, but that she does not feel financially ready to move out. She shared that she continues to have tension and conflict with her partner; she shared about some of the ways that she has tried to change up her communicate style with him recently. [name] tearfully expressed the surprise and sadness she felt to recently learn that her father's body was given for organ donation. She explained that while she agrees with organ donation in general, it still felt incredibly painful to imagine his body mutilated. Paula Villegas decided that she would like to continue counseling with LCSW's replacement.   Suicidal/Homicidal: Nowithout intent/plan  Therapist Response: LCSW utilized supportive counseling techniques throughout the session in order to validate emotions and encourage open expression of emotion. LCSW and Paula Villegas processed about her current stressors and coping skills. LCSW reframed her father's donation as a miracle to someone else, and encouraged Paula Villegas to think of the donation in a spiritual way and not the physical reality of it. LCSW shared that LCSW is leaving the clinic, and offered several options for ongoing counseling. LCSW reflected on [name]'s incredible strength and resiliency.  Plan: New LCSW to call Paula Villegas for ongoing counseling.   Nilda Simmer, LCSW 01/10/2018

## 2018-01-20 ENCOUNTER — Telehealth: Payer: Self-pay | Admitting: Internal Medicine

## 2018-01-20 NOTE — Telephone Encounter (Signed)
Per Dr. Delrae Alfred have patient to follow up with her in 6 months. Please schedule TSH 2 days before follow up with Dr. Delrae Alfred.  To Britt Boozer to notify patient

## 2018-01-20 NOTE — Telephone Encounter (Signed)
Patient will like to know if needs to be scheduled for a TSH lab in the near future; in the last lab results comments didn't needs one.  Please advise.

## 2018-01-21 NOTE — Telephone Encounter (Signed)
Noted  

## 2018-02-01 ENCOUNTER — Ambulatory Visit: Payer: Self-pay | Admitting: Internal Medicine

## 2018-02-01 ENCOUNTER — Encounter: Payer: Self-pay | Admitting: Internal Medicine

## 2018-02-01 VITALS — BP 122/80 | HR 64 | Resp 12 | Ht 61.0 in | Wt 140.0 lb

## 2018-02-01 DIAGNOSIS — M545 Low back pain, unspecified: Secondary | ICD-10-CM

## 2018-02-01 MED ORDER — DICLOFENAC SODIUM 75 MG PO TBEC: 75.0000 mg | DELAYED_RELEASE_TABLET | Freq: Two times a day (BID) | ORAL | 0 refills | Status: DC

## 2018-02-01 MED ORDER — HYDROCODONE-ACETAMINOPHEN 5-325 MG PO TABS: 1.0000 | ORAL_TABLET | Freq: Four times a day (QID) | ORAL | 0 refills | Status: DC | PRN

## 2018-02-01 MED ORDER — CYCLOBENZAPRINE HCL 10 MG PO TABS
ORAL_TABLET | ORAL | 0 refills | Status: DC
Start: 2018-02-01 — End: 2018-04-05

## 2018-02-01 NOTE — Progress Notes (Signed)
   Subjective:    Patient ID: Paula Villegas, female    DOB: 17-Mar-1976, 42 y.o.   MRN: [id number]  HPI   Woke up with center of low back pain 2 days ago.  States this is somewhat similar to pain for which she was seen in ED in 05/2016. No radiation to other area of back or to a leg.   Pain has not worsened over time.  Has more pain with bending over to tie her shoes.   Feels like something is grinding together when she walks at times.   No urinary or bowel incontinence.   No numbness, tingling, or weakness of LE.   Sleep is disturbed by her pain.   No fevers.   She did have a CT scan of Lumbar Vertebral column with some degenerative changes at L5-S1 and some sclerosis at her Left SI joint.  No history of injury recently When she was a young girl, maybe 42 yo,  she fell down stairs, striking her low back.  Was very painful, but was much better in about 3 days.  Took Tylenol last night--helped a little bit. Took 800 mg.    2.  Toenail onychomycosis:  Not wearing nail paint today.  Took the full 12 weeks of Terbinafine in spring/summer.  She feels the abnormal portion of her nail is continuing to narrow and grow out.    Current Meds  Medication Sig  . levothyroxine (SYNTHROID, LEVOTHROID) 25 MCG tablet Take 1 tablet (25 mcg total) by mouth daily before breakfast. (Patient taking differently: Take 25 mcg by mouth. 2 daily in the am 30 minutes before breakfast)  . PARAGARD INTRAUTERINE COPPER IUD IUD 1 each by Intrauterine route once. [facility]. Instructed to remove no later than 03/2023   No Known Allergies  Review of Systems     Objective:   Physical Exam In obvious discomfort Lungs:  CTA CV:  RRR without murmur or rub, radial pulses normal and equal MS:  Tender over lower lumbar and sacral spinous processes as well as bilateral paraspinous musculature at same level. Limited motion, particularly with forward flexion. Neuro: Motor 5/5, DTRs 2+/4, sensory to  light touch grossly normal         Assessment & Plan:  1.  Lumbosacral back pain:  Does not want prednisone as makes her feel unwell.  Diclofenac 75 mg twice daily with food.  Cyclobenzaprine 10 mg 1/2 to 1 tab every 8 hours as needed--discussed will make sleepy. Hydrocodone 5-325 mg mainly at bedtime. Heating pad Gentle stretching as long as does not worsen. PT referral for home exercise program.

## 2018-03-23 DIAGNOSIS — U071 COVID-19: Secondary | ICD-10-CM

## 2018-03-23 HISTORY — DX: COVID-19: U07.1

## 2018-04-04 ENCOUNTER — Ambulatory Visit: Payer: Self-pay | Admitting: Internal Medicine

## 2018-04-05 ENCOUNTER — Ambulatory Visit: Payer: Self-pay | Admitting: Internal Medicine

## 2018-04-05 ENCOUNTER — Encounter: Payer: Self-pay | Admitting: Internal Medicine

## 2018-04-05 VITALS — BP 122/78 | HR 78 | Resp 12 | Ht 61.0 in | Wt 147.0 lb

## 2018-04-05 DIAGNOSIS — K047 Periapical abscess without sinus: Secondary | ICD-10-CM

## 2018-04-05 DIAGNOSIS — E039 Hypothyroidism, unspecified: Secondary | ICD-10-CM

## 2018-04-05 MED ORDER — LEVOTHYROXINE SODIUM 50 MCG PO TABS: ORAL_TABLET | ORAL | 11 refills | Status: DC

## 2018-04-05 MED ORDER — PENICILLIN V POTASSIUM 250 MG PO TABS: ORAL_TABLET | ORAL | 0 refills | Status: DC

## 2018-04-05 NOTE — Patient Instructions (Signed)
Take your hydrocodone at bedtime Try Aleve 2 tabs by mouth twice daily for pain during day.  Always take with food.

## 2018-04-05 NOTE — Progress Notes (Signed)
   Subjective:    Patient ID: Paula Villegas, female    DOB: 10-05-1975, 43 y.o.   MRN: [id number]  HPI Dental pain for 1 week.  She has had swelling and pain around a molar in her lower left jaw.   Unable to chew.   No fever. Ibuprofen 800 mg 4 times daily not helping very much.  Hypothyroidism:  She is still taking two of the 25 mcg Levothyroxine.  For some reason, failed to send 50 mcg tab in.    Intermittent cough:  Only occurs if laughs a lot.  Not with physical activity.  Gets tickle in throat and coughs.  Current Meds  Medication Sig  . levothyroxine (SYNTHROID, LEVOTHROID) 25 MCG tablet Take 1 tablet (25 mcg total) by mouth daily before breakfast. (Patient taking differently: Take 25 mcg by mouth. 2 daily in the am 30 minutes before breakfast)  . PARAGARD INTRAUTERINE COPPER IUD IUD 1 each by Intrauterine route once. Placed 07-04-2013 at [facility]. Instructed to remove no later than 03/2023    No Known Allergies   Review of Systems     Objective:   Physical Exam NAD HEENT; PERRL, EOMI, TMs pearly gray, nasal mucosa without swelling. Left lower premolar with very large filling and some surrounding gingival swelling.  Tender to palpation. Mild swelling of overlying cheek/soft tissue Neck:  Supple, No adenopathy Chest:  CTA CV:  RRR without murmur or rub.        Assessment & Plan:  1.  Abscessed Tooth Pen V K 250 mg 4 times daily for 7 days. Try aleve 2 tabs with food twice daily Has hydrocodone from previous Rx--to try at bedtime for sleep  2. Hypothyroidism:  Change to 50 mcg tabs of Levothyroxine for cost improvement  3.  Cough:  To notify if worsens.  No findings and limited symptoms.

## 2018-05-23 ENCOUNTER — Other Ambulatory Visit: Payer: Self-pay

## 2018-05-23 ENCOUNTER — Encounter (HOSPITAL_COMMUNITY): Payer: Self-pay | Admitting: Emergency Medicine

## 2018-05-23 ENCOUNTER — Ambulatory Visit (HOSPITAL_COMMUNITY)
Admission: EM | Admit: 2018-05-23 | Discharge: 2018-05-23 | Disposition: A | Discharge status: Home / Self Care | Payer: Self-pay | Attending: Internal Medicine | Admitting: Internal Medicine

## 2018-05-23 DIAGNOSIS — Z20828 Contact with and (suspected) exposure to other viral communicable diseases: Secondary | ICD-10-CM

## 2018-05-23 DIAGNOSIS — J9801 Acute bronchospasm: Secondary | ICD-10-CM

## 2018-05-23 DIAGNOSIS — R69 Illness, unspecified: Principal | ICD-10-CM

## 2018-05-23 DIAGNOSIS — R51 Headache: Secondary | ICD-10-CM

## 2018-05-23 DIAGNOSIS — J111 Influenza due to unidentified influenza virus with other respiratory manifestations: Secondary | ICD-10-CM

## 2018-05-23 DIAGNOSIS — R05 Cough: Secondary | ICD-10-CM

## 2018-05-23 MED ORDER — IPRATROPIUM-ALBUTEROL 0.5-2.5 (3) MG/3ML IN SOLN
RESPIRATORY_TRACT | Status: AC
  Filled 2018-05-23: qty 3

## 2018-05-23 MED ORDER — IPRATROPIUM-ALBUTEROL 0.5-2.5 (3) MG/3ML IN SOLN
3.0000 mL | Freq: Once | RESPIRATORY_TRACT | Status: AC
  Administered 2018-05-23: 3 mL via RESPIRATORY_TRACT

## 2018-05-23 MED ORDER — ALBUTEROL SULFATE HFA 108 (90 BASE) MCG/ACT IN AERS: 1.0000 | INHALATION_SPRAY | RESPIRATORY_TRACT | 0 refills | Status: DC | PRN

## 2018-05-23 MED ORDER — OSELTAMIVIR PHOSPHATE 75 MG PO CAPS: 75.0000 mg | ORAL_CAPSULE | Freq: Two times a day (BID) | ORAL | 0 refills | Status: DC

## 2018-05-23 MED ORDER — BENZONATATE 200 MG PO CAPS: 200.0000 mg | ORAL_CAPSULE | Freq: Three times a day (TID) | ORAL | 0 refills | Status: DC | PRN

## 2018-05-23 NOTE — ED Triage Notes (Signed)
Onset today of feeling bad.  Has headache, cough, non-productive cough.  Denies sore throat.  Patient reports body soreness, body chills.

## 2018-05-23 NOTE — ED Provider Notes (Signed)
[facility]    CSN: [id number] Arrival date & time: 05/23/18  1656     History   Chief Complaint Chief Complaint  Patient presents with  . Headache    HPI Paula Villegas is a 43 y.o. female.   She presents today with the onset of bad headache and central chest tightness, little bit of cough, earlier today.  She has been spending time with a good friend, who has influenza.  Patient does not have a fever at this time, not much runny nose, little bit of scratchy sore throat.  She has nausea but no vomiting or diarrhea.  Feels a little bit achy, but most bothered by the headache and the chest tightness/discomfort.  She did not have a flu shot this year.    HPI  Past Medical History:  Diagnosis Date  . Abnormal vaginal bleeding   . Bradycardia   . BV (bacterial vaginosis)   . GERD (gastroesophageal reflux disease)   . IUD migration (HCC)   . Ovarian cyst 2014  . Preterm labor    PTL x2, PTD x1    Patient Active Problem List   Diagnosis Date Noted  . Hypothyroidism 12/14/2017  . Intractable back pain 06/10/2016  . BV (bacterial vaginosis) 06/10/2016  . Abnormal uterine bleeding 06/10/2016  . Bradycardia 06/10/2016  . Sciatica of right side   . Ovarian cyst 03/23/2012  . Appendicitis 05/22/2011  . 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 ANEMIA-NOS 12/14/2008  . BILIARY COLIC 12/14/2008    Past Surgical History:  Procedure Laterality Date  . APPENDECTOMY  2014   Laparoscopic  . INTRAUTERINE DEVICE INSERTION  2015  . LAPAROSCOPIC CHOLECYSTECTOMY  2012    OB History    Gravida  5   Para  4   Term  3   Preterm  1   AB  1   Living  4     SAB  1   TAB      Ectopic      Multiple      Live Births  4            Home Medications    Prior to Admission medications   Medication Sig Start Date End Date Taking? Authorizing Provider  albuterol (PROVENTIL HFA;VENTOLIN HFA) 108 (90 Base) MCG/ACT inhaler Inhale 1-2 puffs into the lungs every  4 (four) hours as needed for wheezing or shortness of breath. 05/23/18   Isa Rankin, MD  benzonatate (TESSALON) 200 MG capsule Take 1 capsule (200 mg total) by mouth 3 (three) times daily as needed for cough. 05/23/18   Isa Rankin, MD  levothyroxine (SYNTHROID, LEVOTHROID) 50 MCG tablet 1 tab by mouth on empty stomach once daily. 04/05/18   Julieanne Manson, MD  oseltamivir (TAMIFLU) 75 MG capsule Take 1 capsule (75 mg total) by mouth every 12 (twelve) hours. 05/23/18   Isa Rankin, MD  PARAGARD INTRAUTERINE COPPER IUD IUD 1 each by Intrauterine route once. Placed 07-04-2013 at [facility]. Instructed to remove no later than 03/2023    [provider]    Family History Family History  Problem Relation Age of Onset  . Hypertension Mother   . Cancer Mother 87       cervical  . Heart disease Mother        Died today, 2017-07-23 when patient in for CPE--MI related to alcohol abuse.  . Asthma Brother   . Diabetes Brother   . Diabetes Father   .  Alcohol abuse Father   . Heart disease Father   . Seizures Brother 34       post traumatic brain injury --pedestrian hit by car  . Hearing loss Neg Hx     Social History Social History   Tobacco Use  . Smoking status: Never Smoker  . Smokeless tobacco: Never Used  Substance Use Topics  . Alcohol use: Yes    Comment: 06/2017:  couple of drinks monthly  . Drug use: No     Allergies   Patient has no known allergies.   Review of Systems Review of Systems  All other systems reviewed and are negative.    Physical Exam Triage Vital Signs ED Triage Vitals  Enc Vitals Group     BP 05/23/18 1824 126/65     Pulse Rate 05/23/18 1824 60     Resp 05/23/18 1824 20     Temp 05/23/18 1824 97.9 F (36.6 C)     Temp Source 05/23/18 1824 Temporal     SpO2 05/23/18 1824 100 %     Weight --      Height --      Pain Score 05/23/18 1822 9     Pain Loc --    Updated Vital Signs BP 126/65 (BP Location: Left Arm)    Pulse 60   Temp 97.9 F (36.6 C) (Temporal)   Resp 20   LMP 05/02/2018   SpO2 100%  Physical Exam Vitals signs and nursing note reviewed.  Constitutional:      General: She is not in acute distress.    Comments: Alert, nicely groomed  HENT:     Head: Atraumatic.     Comments: Bilateral TMs are translucent, slightly pink flushed Mild to moderate nasal congestion bilaterally Posterior pharynx is slightly injected Eyes:     Comments: Conjugate gaze, no eye redness/drainage  Neck:     Musculoskeletal: Neck supple.  Cardiovascular:     Rate and Rhythm: Normal rate and regular rhythm.  Pulmonary:     Effort: No respiratory distress.     Breath sounds: No wheezing or rales.     Comments: Slightly coarse but symmetric breath sounds throughout Deep breathing produces a lot of coughing Abdominal:     General: There is no distension.  Musculoskeletal: Normal range of motion.     Comments: No leg swelling  Skin:    General: Skin is warm and dry.     Comments: No cyanosis  Neurological:     Mental Status: She is alert and oriented to person, place, and time.      UC Treatments / Results   Procedures Procedures (including critical care time)  Medications Ordered in UC Medications  ipratropium-albuterol (DUONEB) 0.5-2.5 (3) MG/3ML nebulizer solution 3 mL (3 mLs Nebulization Given 05/23/18 1855)  some improvement in chest tightness after albuterol treatment at urgent care   Final Clinical Impressions(s) / UC Diagnoses   Final diagnoses:  Influenza-like illness  Bronchospasm     Discharge Instructions     Symptoms and exam today suggest an influenza like illness.  Breathing treatment was given at the urgent care, with mild improvement in chest tightness.  Prescriptions for oseltamivir (for flu), albuterol inhaler (for cough, chest tightness) and benzonatate (for cough) were sent to the pharmacy.  Anticipate gradual improvement in headache and chest tightness/cough over the  next several days.  Cough may take a couple weeks to subside.  Push fluids and rest.  Recheck for new fever >100.5, increasing  phlegm production/nasal discharge, or if not starting to improve in a few days.       ED Prescriptions    Medication Sig Dispense Auth. Provider   oseltamivir (TAMIFLU) 75 MG capsule Take 1 capsule (75 mg total) by mouth every 12 (twelve) hours. 10 capsule Isa Rankin, MD   albuterol (PROVENTIL HFA;VENTOLIN HFA) 108 (90 Base) MCG/ACT inhaler Inhale 1-2 puffs into the lungs every 4 (four) hours as needed for wheezing or shortness of breath. 1 Inhaler Isa Rankin, MD   benzonatate (TESSALON) 200 MG capsule Take 1 capsule (200 mg total) by mouth 3 (three) times daily as needed for cough. 30 capsule Isa Rankin, MD        Isa Rankin, MD 05/25/18 [id number]

## 2018-05-23 NOTE — Discharge Instructions (Addendum)
Symptoms and exam today suggest an influenza like illness.  Breathing treatment was given at the urgent care, with mild improvement in chest tightness.  Prescriptions for oseltamivir (for flu), albuterol inhaler (for cough, chest tightness) and benzonatate (for cough) were sent to the pharmacy.  Anticipate gradual improvement in headache and chest tightness/cough over the next several days.  Cough may take a couple weeks to subside.  Push fluids and rest.  Recheck for new fever >100.5, increasing phlegm production/nasal discharge, or if not starting to improve in a few days.

## 2018-05-25 ENCOUNTER — Emergency Department (HOSPITAL_COMMUNITY)
Admission: EM | Admit: 2018-05-25 | Discharge: 2018-05-25 | Disposition: A | Discharge status: Home / Self Care | Payer: Self-pay | Attending: Emergency Medicine | Admitting: Emergency Medicine

## 2018-05-25 ENCOUNTER — Encounter (HOSPITAL_COMMUNITY): Payer: Self-pay | Admitting: Emergency Medicine

## 2018-05-25 ENCOUNTER — Other Ambulatory Visit: Payer: Self-pay

## 2018-05-25 ENCOUNTER — Emergency Department (HOSPITAL_COMMUNITY): Payer: Self-pay

## 2018-05-25 DIAGNOSIS — J069 Acute upper respiratory infection, unspecified: Secondary | ICD-10-CM | POA: Insufficient documentation

## 2018-05-25 DIAGNOSIS — R0602 Shortness of breath: Secondary | ICD-10-CM | POA: Insufficient documentation

## 2018-05-25 DIAGNOSIS — E039 Hypothyroidism, unspecified: Secondary | ICD-10-CM | POA: Insufficient documentation

## 2018-05-25 DIAGNOSIS — J40 Bronchitis, not specified as acute or chronic: Secondary | ICD-10-CM | POA: Insufficient documentation

## 2018-05-25 DIAGNOSIS — Z79899 Other long term (current) drug therapy: Secondary | ICD-10-CM | POA: Insufficient documentation

## 2018-05-25 DIAGNOSIS — R0789 Other chest pain: Secondary | ICD-10-CM | POA: Insufficient documentation

## 2018-05-25 LAB — BASIC METABOLIC PANEL
Anion gap: 4 — ABNORMAL LOW (ref 5–15)
BUN: 8 mg/dL (ref 6–20)
CHLORIDE: 106 mmol/L (ref 98–111)
CO2: 27 mmol/L (ref 22–32)
Calcium: 8.9 mg/dL (ref 8.9–10.3)
Creatinine, Ser: 0.97 mg/dL (ref 0.44–1.00)
GFR calc Af Amer: >60 mL/min (ref >60)
GFR calc non Af Amer: >60 mL/min (ref >60)
Glucose, Bld: 102 mg/dL — ABNORMAL HIGH (ref 70–99)
POTASSIUM: 3.4 mmol/L — AB (ref 3.5–5.1)
Sodium: 137 mmol/L (ref 135–145)

## 2018-05-25 LAB — CBC WITH DIFFERENTIAL/PLATELET
Abs Immature Granulocytes: 0.02 10*3/uL (ref 0.00–0.07)
Basophils Absolute: 0 10*3/uL (ref 0.0–0.1)
Basophils Relative: 0 %
Eosinophils Absolute: 0.1 10*3/uL (ref 0.0–0.5)
Eosinophils Relative: 1 %
HEMATOCRIT: 38.3 % (ref 36.0–46.0)
Hemoglobin: 11.9 g/dL — ABNORMAL LOW (ref 12.0–15.0)
Immature Granulocytes: 0 %
Lymphocytes Relative: 33 %
Lymphs Abs: 2.9 10*3/uL (ref 0.7–4.0)
MCH: 25.6 pg — ABNORMAL LOW (ref 26.0–34.0)
MCHC: 31.1 g/dL (ref 30.0–36.0)
MCV: 82.5 fL (ref 80.0–100.0)
Monocytes Absolute: 0.7 10*3/uL (ref 0.1–1.0)
Monocytes Relative: 9 %
Neutro Abs: 4.9 10*3/uL (ref 1.7–7.7)
Neutrophils Relative %: 57 %
Platelets: 351 10*3/uL (ref 150–400)
RBC: 4.64 MIL/uL (ref 3.87–5.11)
RDW: 15.3 % (ref 11.5–15.5)
WBC: 8.6 10*3/uL (ref 4.0–10.5)
nRBC: 0 % (ref 0.0–0.2)

## 2018-05-25 LAB — I-STAT TROPONIN, ED: Troponin i, poc: 0.02 ng/mL (ref 0.00–0.08)

## 2018-05-25 MED ORDER — IPRATROPIUM-ALBUTEROL 0.5-2.5 (3) MG/3ML IN SOLN
3.0000 mL | Freq: Once | RESPIRATORY_TRACT | Status: AC
  Administered 2018-05-25: 3 mL via RESPIRATORY_TRACT
  Filled 2018-05-25: qty 3

## 2018-05-25 MED ORDER — PSEUDOEPH-BROMPHEN-DM 30-2-10 MG/5ML PO SYRP: 5.0000 mL | ORAL_SOLUTION | Freq: Four times a day (QID) | ORAL | 0 refills | Status: DC | PRN

## 2018-05-25 MED ORDER — PREDNISONE 10 MG PO TABS: 40.0000 mg | ORAL_TABLET | Freq: Every day | ORAL | 0 refills | Status: AC

## 2018-05-25 NOTE — ED Provider Notes (Addendum)
[facility] EMERGENCY DEPARTMENT Provider Note   CSN: [id number] Arrival date & time: 05/25/18  1746    History   Chief Complaint Chief Complaint  Patient presents with  . URI    HPI Paula Villegas is a 42 y.o. female.     Patient is a 43 year old female with past medical history of bradycardia, acid reflux who presents the emergency department for URI symptoms.  This is been going on for longer than a week.  Reports dry cough, chest tightness and shortness of breath with exertion. Was seen by urgent care a few days ago and was given tamiflu, tessalon pearls and albuterol inhaler. Reports no better. Denies fever, chills, n/v/d     Past Medical History:  Diagnosis Date  . Abnormal vaginal bleeding   . Bradycardia   . BV (bacterial vaginosis)   . GERD (gastroesophageal reflux disease)   . IUD migration (HCC)   . Ovarian cyst 2014  . Preterm labor    PTL x2, PTD x1    Patient Active Problem List   Diagnosis Date Noted  . Hypothyroidism 12/14/2017  . Intractable back pain 06/10/2016  . BV (bacterial vaginosis) 06/10/2016  . Abnormal uterine bleeding 06/10/2016  . Bradycardia 06/10/2016  . Sciatica of right side   . Ovarian cyst 03/23/2012  . Appendicitis 05/22/2011  . 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 ANEMIA-NOS 12/14/2008  . BILIARY COLIC 12/14/2008    Past Surgical History:  Procedure Laterality Date  . APPENDECTOMY  2014   Laparoscopic  . INTRAUTERINE DEVICE INSERTION  2015  . LAPAROSCOPIC CHOLECYSTECTOMY  2012     OB History    Gravida  5   Para  4   Term  3   Preterm  1   AB  1   Living  4     SAB  1   TAB      Ectopic      Multiple      Live Births  4            Home Medications    Prior to Admission medications   Medication Sig Start Date End Date Taking? Authorizing Provider  albuterol (PROVENTIL HFA;VENTOLIN HFA) 108 (90 Base) MCG/ACT inhaler Inhale 1-2 puffs into the lungs every 4 (four) hours as  needed for wheezing or shortness of breath. 05/23/18   Isa Rankin, MD  benzonatate (TESSALON) 200 MG capsule Take 1 capsule (200 mg total) by mouth 3 (three) times daily as needed for cough. 05/23/18   Isa Rankin, MD  brompheniramine-pseudoephedrine-DM 30-2-10 MG/5ML syrup Take 5 mLs by mouth 4 (four) times daily as needed. Patient not taking: Reported on 06/03/2018 05/25/18   Arlyn Dunning, PA-C  cetirizine (ZYRTEC) 10 MG tablet Take 1 tablet (10 mg total) by mouth daily. 06/03/18   Julieanne Manson, MD  ibuprofen (ADVIL,MOTRIN) 200 MG tablet Take 200 mg by mouth every 6 (six) hours as needed.    [provider]  levothyroxine (SYNTHROID, LEVOTHROID) 50 MCG tablet 1 tab by mouth on empty stomach once daily. 04/05/18   Julieanne Manson, MD  oseltamivir (TAMIFLU) 75 MG capsule Take 1 capsule (75 mg total) by mouth every 12 (twelve) hours. Patient not taking: Reported on 06/03/2018 05/23/18   Isa Rankin, MD  [facility] INTRAUTERINE COPPER IUD IUD 1 each by Intrauterine route once. Placed 07-04-2013 at [facility]. Instructed to remove no later than 03/2023    [provider]    Family History  Family History  Problem Relation Age of Onset  . Hypertension Mother   . Cancer Mother 8       cervical  . Heart disease Mother        Died today, 07-13-17 when patient in for CPE--MI related to alcohol abuse.  . Asthma Brother   . Diabetes Brother   . Diabetes Father   . Alcohol abuse Father   . Heart disease Father   . Seizures Brother 34       post traumatic brain injury --pedestrian hit by car  . Hearing loss Neg Hx     Social History Social History   Tobacco Use  . Smoking status: Never Smoker  . Smokeless tobacco: Never Used  Substance Use Topics  . Alcohol use: Yes    Comment: 06/2017:  couple of drinks monthly  . Drug use: No     Allergies   Patient has no known allergies.   Review of Systems Review of Systems  Constitutional: Negative  for activity change, appetite change, chills and fever.  HENT: Positive for rhinorrhea. Negative for ear pain and sore throat.   Eyes: Negative for pain and visual disturbance.  Respiratory: Positive for cough, chest tightness and shortness of breath.   Cardiovascular: Negative for chest pain and palpitations.  Gastrointestinal: Positive for nausea. Negative for abdominal pain and vomiting.  Genitourinary: Negative for dysuria and hematuria.  Musculoskeletal: Negative for arthralgias and back pain.  Skin: Negative for color change and rash.  Allergic/Immunologic: Negative for environmental allergies.  Neurological: Negative for dizziness, seizures and syncope.  All other systems reviewed and are negative.    Physical Exam Updated Vital Signs BP (!) 110/58 (BP Location: Right Arm)   Pulse 65   Temp 98.4 F (36.9 C) (Oral)   Resp 17   LMP 05/02/2018   SpO2 100%   Physical Exam Vitals signs and nursing note reviewed.  Constitutional:      General: She is not in acute distress.    Appearance: Normal appearance. She is not ill-appearing, toxic-appearing or diaphoretic.  HENT:     Head: Normocephalic and atraumatic.     Right Ear: Tympanic membrane normal.     Left Ear: Tympanic membrane normal.     Nose: Nose normal.     Mouth/Throat:     Mouth: Mucous membranes are moist.  Eyes:     Conjunctiva/sclera: Conjunctivae normal.     Pupils: Pupils are equal, round, and reactive to light.  Cardiovascular:     Rate and Rhythm: Normal rate and regular rhythm.  Pulmonary:     Effort: Pulmonary effort is normal.     Breath sounds: Normal breath sounds. No wheezing, rhonchi or rales.  Musculoskeletal:     Right lower leg: No edema.     Left lower leg: No edema.  Skin:    General: Skin is warm.     Capillary Refill: Capillary refill takes less than 2 seconds.  Neurological:     General: No focal deficit present.     Mental Status: She is alert.  Psychiatric:        Mood and  Affect: Mood normal.      ED Treatments / Results  Labs (all labs ordered are listed, but only abnormal results are displayed) Labs Reviewed  CBC WITH DIFFERENTIAL/PLATELET - Abnormal; Notable for the following components:      Result Value   Hemoglobin 11.9 (*)    MCH 25.6 (*)    All other components  within normal limits  BASIC METABOLIC PANEL - Abnormal; Notable for the following components:   Potassium 3.4 (*)    Glucose, Bld 102 (*)    Anion gap 4 (*)    All other components within normal limits  I-STAT TROPONIN, ED    EKG EKG Interpretation  Date/Time:  Wednesday May 25 2018 18:09:35 EST Ventricular Rate:  76 PR Interval:  154 QRS Duration: 76 QT Interval:  370 QTC Calculation: 416 R Axis:   90 Text Interpretation:  Sinus rhythm with frequent Premature ventricular complexes Rightward axis Nonspecific T wave abnormality Abnormal ECG No old tracing to compare Confirmed by Jacalyn Lefevre [phone]) on 05/26/2018 2:11:34 PM   Radiology No results found.  Procedures Procedures (including critical care time)  Medications Ordered in ED Medications  ipratropium-albuterol (DUONEB) 0.5-2.5 (3) MG/3ML nebulizer solution 3 mL (3 mLs Nebulization Given 05/25/18 2036)     Initial Impression / Assessment and Plan / ED Course  I have reviewed the triage vital signs and the nursing notes.  Pertinent labs & imaging results that were available during my care of the patient were reviewed by me and considered in my medical decision making (see chart for details).  Clinical Course as of Jun 13 1231  Wed May 25, 2018  2143 Patient here with cough, chest pain, shortness of breath over the last several days.  Treated for the flu with Tamiflu, albuterol inhaler, Tessalon Perles from urgent care despite no flu swab.  Denies fever.  Patient came in for continuation of symptoms.  On work-up she has mild wheezing in bilateral lungs.  She has a heart score of 1 and is PERC negative.  Nothing  acute on EKG.  Negative troponin.  Unremarkable CBC and CMP.  Going to treat her for bronchitis with some steroid medication,   [KM]    Clinical Course User Index [KM] Arlyn Dunning, PA-C       Based on review of vitals, medical screening exam, lab work and/or imaging, there does not appear to be an acute, emergent etiology for the patient's symptoms. Counseled pt on good return precautions and encouraged both PCP and ED follow-up as needed.  Prior to discharge, I also discussed incidental imaging findings with patient in detail and advised appropriate, recommended follow-up in detail.  Clinical Impression: 1. Viral upper respiratory tract infection   2. Bronchitis     Disposition: Discharge    This note was prepared with assistance of Dragon voice recognition software. Occasional wrong-word or sound-a-like substitutions may have occurred due to the inherent limitations of voice recognition software.   Final Clinical Impressions(s) / ED Diagnoses   Final diagnoses:  Viral upper respiratory tract infection  Bronchitis    ED Discharge Orders         Ordered    predniSONE (DELTASONE) 10 MG tablet  Daily     05/25/18 2149    brompheniramine-pseudoephedrine-DM 30-2-10 MG/5ML syrup  4 times daily PRN     05/25/18 2149           Arlyn Dunning, PA-C 05/26/18 1610    [address] 05/26/18 1612    Margarita Grizzle, MD 06/02/18 1716    Arlyn Dunning, PA-C 06/13/18 1233    Margarita Grizzle, MD 06/18/18 [phone]

## 2018-05-25 NOTE — ED Triage Notes (Signed)
Pt seen in UC for chest tightness and cough, on Monday, diagnosed with bronchitis and influenza. Pt here for continued coughing and pain with deep breaths and coughing.

## 2018-05-25 NOTE — ED Notes (Signed)
Patient verbalizes understanding of discharge instructions. Opportunity for questioning and answers were provided. Armband removed by staff, pt discharged from ED.  

## 2018-05-25 NOTE — Discharge Instructions (Signed)
Continue your inhaler every 4-6 hours. Thank you for allowing me to care for you today. Please return to the emergency department if you have new or worsening symptoms. Take your medications as instructed.

## 2018-06-03 ENCOUNTER — Ambulatory Visit (INDEPENDENT_AMBULATORY_CARE_PROVIDER_SITE_OTHER): Payer: Self-pay | Admitting: Internal Medicine

## 2018-06-03 ENCOUNTER — Encounter: Payer: Self-pay | Admitting: Internal Medicine

## 2018-06-03 VITALS — BP 110/80 | HR 66 | Temp 97.8°F | Resp 12 | Ht 61.0 in | Wt 147.0 lb

## 2018-06-03 DIAGNOSIS — M940 Chondrocostal junction syndrome [Tietze]: Secondary | ICD-10-CM

## 2018-06-03 DIAGNOSIS — J302 Other seasonal allergic rhinitis: Secondary | ICD-10-CM

## 2018-06-03 DIAGNOSIS — J40 Bronchitis, not specified as acute or chronic: Secondary | ICD-10-CM

## 2018-06-03 MED ORDER — CETIRIZINE HCL 10 MG PO TABS: 10.0000 mg | ORAL_TABLET | Freq: Every day | ORAL | 11 refills | Status: DC

## 2018-06-03 NOTE — Progress Notes (Signed)
    Subjective:    Patient ID: Paula Villegas, female   DOB: [birth year], 43 y.o.   MRN: [id number]   HPI   Respiratory symptoms:  Gives history of going to ED with a friend who was with ill with a fever 2 weeks before March 2nd.  Awakened on Monday, March 2nd with headache, anterior chest pain and dyspnea, but no cough.  + fatigue - fever. Went to [facility] at 6 p.m. Noted to have wheezing and treated with albuterol neb. Influenza testing not done, but placed on Tamiflu for presumptive flu. Tessalon perles, though patient states had no cough, and Albuterol HFA.  Two days later, returned to Urgent care as continued to have chest tightness. Again, no significant cough, no fever, no bodyaches or sore throat. Records at urgent care document complaint of a dry cough, however. Found to have wheezing again. ECG was normal with negative Troponin I.  CMP and CBC unremarkable.  CXR clear. Tamiflu stopped per patient, though do not see that in record. Prednisone burst and taper for viral bronchitis prescribed. Finished the prednisone course 5 days ago. Does not feel it helped with chest tightness    She is clearing her throat in clinic today, but states little to no drainage. Today states she was outside in parking lot when they were cutting grass and that's when clearing of throat and coughing started.      Current Meds  Medication Sig  . albuterol (PROVENTIL HFA;VENTOLIN HFA) 108 (90 Base) MCG/ACT inhaler Inhale 1-2 puffs into the lungs every 4 (four) hours as needed for wheezing or shortness of breath.  . benzonatate (TESSALON) 200 MG capsule Take 1 capsule (200 mg total) by mouth 3 (three) times daily as needed for cough.  Marland Kitchen ibuprofen (ADVIL,MOTRIN) 200 MG tablet Take 200 mg by mouth every 6 (six) hours as needed.  Marland Kitchen levothyroxine (SYNTHROID, LEVOTHROID) 50 MCG tablet 1 tab by mouth on empty stomach once daily.  Marland Kitchen PARAGARD INTRAUTERINE COPPER IUD IUD 1 each by  Intrauterine route once. Placed 07-04-2013 at [facility]. Instructed to remove no later than 03/2023   No Known Allergies   Review of Systems    Objective:   BP 110/80 (BP Location: Left Arm, Patient Position: Sitting, Cuff Size: Normal)   Pulse 66   Temp 97.8 F (36.6 C) (Oral)   Resp 12   Ht 5\' 1"  (1.549 m)   Wt 147 lb (66.7 kg)   LMP 05/29/2018   SpO2 99%   PF 350 L/min   BMI 27.78 kg/m   Physical Exam  NAD HEENT:  PERRL, EOMI, TMs pearly gray, throat without injection.  Nasal mucosa swollen with clear discharge. Neck:  Supple, No adenopathy Chest:  CTA with good air movement.  Tender over left and right sternal margins with ribs. CV:  RRR without murmur or rub.  Radial pulses normal and equal   Assessment & Plan   1.  Previous bronchitis that appears to be improving.  2.  Chest wall pain:  Feel this is the cause of her sense of tightness currently.  To take Ibuprofen for headache and chest discomfort  3.  Allergies:  Zyrtec 10 mg daily.

## 2018-06-03 NOTE — Patient Instructions (Signed)
Start the Zyrtec. Stay at home this weekend. Take ibuprofen 200 mg 3 tabs twice daily for headache and chest discomfort.

## 2018-07-21 ENCOUNTER — Other Ambulatory Visit: Payer: Self-pay

## 2018-07-21 DIAGNOSIS — E039 Hypothyroidism, unspecified: Secondary | ICD-10-CM

## 2018-07-22 DIAGNOSIS — R7303 Prediabetes: Secondary | ICD-10-CM

## 2018-07-22 HISTORY — DX: Prediabetes: R73.03

## 2018-07-22 LAB — TSH: TSH: 3.47 u[IU]/mL (ref 0.450–4.500)

## 2018-07-25 ENCOUNTER — Other Ambulatory Visit: Payer: Self-pay

## 2018-07-25 ENCOUNTER — Telehealth (INDEPENDENT_AMBULATORY_CARE_PROVIDER_SITE_OTHER): Payer: Self-pay | Admitting: Internal Medicine

## 2018-07-25 DIAGNOSIS — E039 Hypothyroidism, unspecified: Secondary | ICD-10-CM

## 2018-07-25 DIAGNOSIS — B349 Viral infection, unspecified: Secondary | ICD-10-CM

## 2018-07-25 DIAGNOSIS — J302 Other seasonal allergic rhinitis: Secondary | ICD-10-CM

## 2018-07-25 MED ORDER — CETIRIZINE HCL 10 MG PO TABS: 10.0000 mg | ORAL_TABLET | Freq: Every day | ORAL | 11 refills | Status: DC

## 2018-07-25 NOTE — Progress Notes (Signed)
Done

## 2018-07-25 NOTE — Progress Notes (Signed)
    Subjective:    Patient ID: Paula Villegas, female   DOB: 1976-01-01, 43 y.o.   MRN: [id number]   HPI   1.  Cough and congestion:  States the chest tightness did get better after last seen on March 13th.   Only has the chest tightness with a cough that started 3 days ago. Started with fever and headaches and chest tightness and cough.   Temp was 100 forehead thermometer the first day of illness. Felt very weak on Saturday with body aches.  Temp on Saturday was 101. Yesterday and today down to 99.3.   Feels weak with a bad headache, frontal today.   No one in household is ill. Does not work. Has only been to the store.   Her son works with his father painting.  Father is not part of the household and neither are ill.    She did use albuterol inhaler on the second day.   She has been using Theraflu and using Ibuprofen.  2.  Hypothyroidism:  Recent TSH in normal range.  Continues on 50 mcg Levothyroxine.  Current Meds  Medication Sig  . albuterol (PROVENTIL HFA;VENTOLIN HFA) 108 (90 Base) MCG/ACT inhaler Inhale 1-2 puffs into the lungs every 4 (four) hours as needed for wheezing or shortness of breath.  . cetirizine (ZYRTEC) 10 MG tablet Take 1 tablet (10 mg total) by mouth daily.  Marland Kitchen ibuprofen (ADVIL,MOTRIN) 200 MG tablet Take 200 mg by mouth every 6 (six) hours as needed.  Marland Kitchen levothyroxine (SYNTHROID, LEVOTHROID) 50 MCG tablet 1 tab by mouth on empty stomach once daily.   No Known Allergies   Review of Systems    Objective:   There were no vitals taken for this visit.  Physical Exam  Nasally congested.   NAD   Assessment & Plan  1.  Viral syndrome:  Possibly COVID19.  No way to isolate herself from family as she is the only adult. Encouraged to wear a mask when around others in the household.  Good handwashing. She appears to be improving. Continue supportive management with Theraflu and Ibuprofen. Call if worsens.  2.  Hypothyroidism:  Adequately replaced.   CPM>  3.  Allergies:  Cetirizine sent into [facility].  Should be less than $5.    Needs orange card renewal. CPE without pap in 3-4 months. Info sent to [facility]

## 2018-07-28 NOTE — Progress Notes (Signed)
Contacted Paula Villegas. R at [phone] about [facility] card renewal- pt. received application from nicky at [facility]- pt. will drop it off at clinics mailbox with all documents.

## 2018-08-01 ENCOUNTER — Telehealth: Payer: Self-pay | Admitting: Internal Medicine

## 2018-08-01 ENCOUNTER — Other Ambulatory Visit: Payer: Self-pay

## 2018-08-01 ENCOUNTER — Other Ambulatory Visit (INDEPENDENT_AMBULATORY_CARE_PROVIDER_SITE_OTHER): Payer: Self-pay

## 2018-08-01 DIAGNOSIS — R509 Fever, unspecified: Secondary | ICD-10-CM

## 2018-08-01 NOTE — Telephone Encounter (Signed)
Patient called stating has been having chest pain, shortness of breath, cough and bad headache x three days; patient states had fever last Friday.  Sharing information with Cherice to triage patient.  Please advise.

## 2018-08-01 NOTE — Telephone Encounter (Signed)
Error

## 2018-08-01 NOTE — Telephone Encounter (Signed)
Per Dr. Delrae Alfred have patient come in and do blood work. Paula Villegas spoke with patient and she is coming in for labs

## 2018-08-02 ENCOUNTER — Emergency Department (HOSPITAL_COMMUNITY): Payer: HRSA Program

## 2018-08-02 ENCOUNTER — Encounter (HOSPITAL_COMMUNITY): Payer: Self-pay

## 2018-08-02 ENCOUNTER — Other Ambulatory Visit: Payer: Self-pay

## 2018-08-02 ENCOUNTER — Emergency Department (HOSPITAL_COMMUNITY)
Admission: EM | Admit: 2018-08-02 | Discharge: 2018-08-02 | Disposition: A | Discharge status: Home / Self Care | Payer: HRSA Program | Attending: Emergency Medicine | Admitting: Emergency Medicine

## 2018-08-02 DIAGNOSIS — Z79899 Other long term (current) drug therapy: Secondary | ICD-10-CM | POA: Insufficient documentation

## 2018-08-02 DIAGNOSIS — J1282 Pneumonia due to coronavirus disease 2019: Secondary | ICD-10-CM

## 2018-08-02 DIAGNOSIS — E039 Hypothyroidism, unspecified: Secondary | ICD-10-CM | POA: Insufficient documentation

## 2018-08-02 DIAGNOSIS — R05 Cough: Secondary | ICD-10-CM | POA: Diagnosis present

## 2018-08-02 DIAGNOSIS — U071 COVID-19: Secondary | ICD-10-CM | POA: Diagnosis not present

## 2018-08-02 DIAGNOSIS — Z03818 Encounter for observation for suspected exposure to other biological agents ruled out: Secondary | ICD-10-CM | POA: Insufficient documentation

## 2018-08-02 DIAGNOSIS — J1289 Other viral pneumonia: Secondary | ICD-10-CM | POA: Insufficient documentation

## 2018-08-02 LAB — COMPREHENSIVE METABOLIC PANEL
ALT: 22 IU/L (ref 0–32)
ALT: 30 U/L (ref 0–44)
AST: 23 IU/L (ref 0–40)
AST: 35 U/L (ref 15–41)
Albumin/Globulin Ratio: 1.3 (ref 1.2–2.2)
Albumin: 3.9 g/dL (ref 3.8–4.8)
Albumin: 3.4 g/dL — ABNORMAL LOW (ref 3.5–5.0)
Alkaline Phosphatase: 75 IU/L (ref 39–117)
Alkaline Phosphatase: 67 U/L (ref 38–126)
Anion gap: 9 (ref 5–15)
BUN/Creatinine Ratio: 11 (ref 9–23)
BUN: 10 mg/dL (ref 6–24)
BUN: 8 mg/dL (ref 6–20)
Bilirubin Total: 0.4 mg/dL (ref 0.0–1.2)
CO2: 20 mmol/L (ref 20–29)
CO2: 21 mmol/L — ABNORMAL LOW (ref 22–32)
Calcium: 8.3 mg/dL — ABNORMAL LOW (ref 8.7–10.2)
Calcium: 8.2 mg/dL — ABNORMAL LOW (ref 8.9–10.3)
Chloride: 101 mmol/L (ref 96–106)
Chloride: 106 mmol/L (ref 98–111)
Creatinine, Ser: 0.95 mg/dL (ref 0.57–1.00)
Creatinine, Ser: 0.9 mg/dL (ref 0.44–1.00)
GFR calc Af Amer: 84 mL/min/{1.73_m2} (ref >59)
GFR calc Af Amer: >60 mL/min (ref >60)
GFR calc non Af Amer: 73 mL/min/{1.73_m2} (ref >59)
GFR calc non Af Amer: >60 mL/min (ref >60)
Globulin, Total: 2.9 g/dL (ref 1.5–4.5)
Glucose, Bld: 108 mg/dL — ABNORMAL HIGH (ref 70–99)
Glucose: 111 mg/dL — ABNORMAL HIGH (ref 65–99)
Potassium: 4.2 mmol/L (ref 3.5–5.2)
Potassium: 3.9 mmol/L (ref 3.5–5.1)
Sodium: 135 mmol/L (ref 134–144)
Sodium: 136 mmol/L (ref 135–145)
Total Bilirubin: 0.6 mg/dL (ref 0.3–1.2)
Total Protein: 6.8 g/dL (ref 6.0–8.5)
Total Protein: 7.5 g/dL (ref 6.5–8.1)

## 2018-08-02 LAB — ETHANOL: Alcohol, Ethyl (B): <10 mg/dL (ref <10)

## 2018-08-02 LAB — CBC WITH DIFFERENTIAL/PLATELET
Abs Immature Granulocytes: 0.04 10*3/uL (ref 0.00–0.07)
Basophils Absolute: 0 10*3/uL (ref 0.0–0.2)
Basophils Absolute: 0 10*3/uL (ref 0.0–0.1)
Basophils Relative: 0 %
Basos: 0 %
EOS (ABSOLUTE): 0 10*3/uL (ref 0.0–0.4)
Eos: 0 %
Eosinophils Absolute: 0 10*3/uL (ref 0.0–0.5)
Eosinophils Relative: 0 %
HCT: 35.3 % — ABNORMAL LOW (ref 36.0–46.0)
Hematocrit: 35.3 % (ref 34.0–46.6)
Hemoglobin: 11.5 g/dL (ref 11.1–15.9)
Hemoglobin: 11.3 g/dL — ABNORMAL LOW (ref 12.0–15.0)
Immature Grans (Abs): 0 10*3/uL (ref 0.0–0.1)
Immature Granulocytes: 0 %
Immature Granulocytes: 1 %
Lymphocytes Absolute: 1.3 10*3/uL (ref 0.7–3.1)
Lymphocytes Relative: 15 %
Lymphs Abs: 1.2 10*3/uL (ref 0.7–4.0)
Lymphs: 19 %
MCH: 24.9 pg — ABNORMAL LOW (ref 26.6–33.0)
MCH: 24.8 pg — ABNORMAL LOW (ref 26.0–34.0)
MCHC: 32.6 g/dL (ref 31.5–35.7)
MCHC: 32 g/dL (ref 30.0–36.0)
MCV: 77 fL — ABNORMAL LOW (ref 79–97)
MCV: 77.6 fL — ABNORMAL LOW (ref 80.0–100.0)
Monocytes Absolute: 0.3 10*3/uL (ref 0.1–0.9)
Monocytes Absolute: 0.3 10*3/uL (ref 0.1–1.0)
Monocytes Relative: 3 %
Monocytes: 4 %
Neutro Abs: 6.6 10*3/uL (ref 1.7–7.7)
Neutrophils Absolute: 5.2 10*3/uL (ref 1.4–7.0)
Neutrophils Relative %: 81 %
Neutrophils: 77 %
Platelets: 303 10*3/uL (ref 150–450)
Platelets: 305 10*3/uL (ref 150–400)
RBC: 4.61 x10E6/uL (ref 3.77–5.28)
RBC: 4.55 MIL/uL (ref 3.87–5.11)
RDW: 16.3 % — ABNORMAL HIGH (ref 11.7–15.4)
RDW: 16.5 % — ABNORMAL HIGH (ref 11.5–15.5)
WBC: 6.8 10*3/uL (ref 3.4–10.8)
WBC: 8.2 10*3/uL (ref 4.0–10.5)
nRBC: 0 % (ref 0.0–0.2)

## 2018-08-02 LAB — TYPE AND SCREEN
ABO/RH(D): O POS
Antibody Screen: NEGATIVE

## 2018-08-02 LAB — LIPASE, BLOOD: Lipase: 29 U/L (ref 11–51)

## 2018-08-02 LAB — SARS CORONAVIRUS 2 BY RT PCR (HOSPITAL ORDER, PERFORMED IN ~~LOC~~ HOSPITAL LAB): SARS Coronavirus 2: POSITIVE — AB

## 2018-08-02 MED ORDER — AZITHROMYCIN 250 MG PO TABS: 250.0000 mg | ORAL_TABLET | Freq: Every day | ORAL | 0 refills | Status: DC

## 2018-08-02 MED ORDER — SODIUM CHLORIDE 0.9 % IV BOLUS
1000.0000 mL | Freq: Once | INTRAVENOUS | Status: AC
  Administered 2018-08-02: 1000 mL via INTRAVENOUS

## 2018-08-02 MED ORDER — KETOROLAC TROMETHAMINE 30 MG/ML IJ SOLN
15.0000 mg | Freq: Once | INTRAMUSCULAR | Status: AC
  Administered 2018-08-02: 14:00:00 15 mg via INTRAVENOUS
  Filled 2018-08-02: qty 1

## 2018-08-02 MED ORDER — AMOXICILLIN-POT CLAVULANATE 875-125 MG PO TABS: 1.0000 | ORAL_TABLET | Freq: Once | ORAL | Status: DC

## 2018-08-02 MED ORDER — SODIUM CHLORIDE 0.9 % IV SOLN
500.0000 mg | Freq: Once | INTRAVENOUS | Status: AC
  Administered 2018-08-02: 500 mg via INTRAVENOUS
  Filled 2018-08-02: qty 500

## 2018-08-02 MED ORDER — ONDANSETRON HCL 4 MG/2ML IJ SOLN
4.0000 mg | Freq: Once | INTRAMUSCULAR | Status: AC
  Administered 2018-08-02: 14:00:00 4 mg via INTRAVENOUS
  Filled 2018-08-02: qty 2

## 2018-08-02 NOTE — ED Triage Notes (Signed)
Pt arrives for chest pain and cough, fever of 100.3 at [facility]. Pt has been feeling unwell for two months, but worse over last two weeks with anemia and nausea and diarrhea

## 2018-08-02 NOTE — Discharge Instructions (Signed)
Person Under Monitoring Name: Paula Villegas  Location: [address] [place] [address]   Infection Prevention Recommendations for Individuals Confirmed to have, or Being Evaluated for, 2019 Novel Coronavirus (COVID-19) Infection Who Receive Care at Home  Individuals who are confirmed to have, or are being evaluated for, COVID-19 should follow the prevention steps below until a healthcare provider or local or state health department says they can return to normal activities.  Stay home except to get medical care You should restrict activities outside your home, except for getting medical care. Do not go to work, school, or public areas, and do not use public transportation or taxis.  Call ahead before visiting your doctor Before your medical appointment, call the healthcare provider and tell them that you have, or are being evaluated for, COVID-19 infection. This will help the healthcare providers office take steps to keep other people from getting infected. Ask your healthcare provider to call the local or state health department.  Monitor your symptoms Seek prompt medical attention if your illness is worsening (e.g., difficulty breathing). Before going to your medical appointment, call the healthcare provider and tell them that you have, or are being evaluated for, COVID-19 infection. Ask your healthcare provider to call the local or state health department.  Wear a facemask You should wear a facemask that covers your nose and mouth when you are in the same room with other people and when you visit a healthcare provider. People who live with or visit you should also wear a facemask while they are in the same room with you.  Separate yourself from other people in your home As much as possible, you should stay in a different room from other people in your home. Also, you should use a separate bathroom, if available.  Avoid sharing household items You should not  share dishes, drinking glasses, cups, eating utensils, towels, bedding, or other items with other people in your home. After using these items, you should wash them thoroughly with soap and water.  Cover your coughs and sneezes Cover your mouth and nose with a tissue when you cough or sneeze, or you can cough or sneeze into your sleeve. Throw used tissues in a lined trash can, and immediately wash your hands with soap and water for at least 20 seconds or use an alcohol-based hand rub.  Wash your [employer] your hands often and thoroughly with soap and water for at least 20 seconds. You can use an alcohol-based hand sanitizer if soap and water are not available and if your hands are not visibly dirty. Avoid touching your eyes, nose, and mouth with unwashed hands.   Prevention Steps for Caregivers and Household Members of Individuals Confirmed to have, or Being Evaluated for, COVID-19 Infection Being Cared for in the Home  If you live with, or provide care at home for, a person confirmed to have, or being evaluated for, COVID-19 infection please follow these guidelines to prevent infection:  Follow healthcare providers instructions Make sure that you understand and can help the patient follow any healthcare provider instructions for all care.  Provide for the patients basic needs You should help the patient with basic needs in the home and provide support for getting groceries, prescriptions, and other personal needs.  Monitor the patients symptoms If they are getting sicker, call his or her medical provider and tell them that the patient has, or is being evaluated for, COVID-19 infection. This will help the healthcare providers  office take steps to keep other people from getting infected. Ask the healthcare provider to call the local or state health department.  Limit the number of people who have contact with the patient If possible, have only one caregiver for the  patient. Other household members should stay in another home or place of residence. If this is not possible, they should stay in another room, or be separated from the patient as much as possible. Use a separate bathroom, if available. Restrict visitors who do not have an essential need to be in the home.  Keep older adults, very young children, and other sick people away from the patient Keep older adults, very young children, and those who have compromised immune systems or chronic health conditions away from the patient. This includes people with chronic heart, lung, or kidney conditions, diabetes, and cancer.  Ensure good ventilation Make sure that shared spaces in the home have good air flow, such as from an air conditioner or an opened window, weather permitting.  Wash your hands often Wash your hands often and thoroughly with soap and water for at least 20 seconds. You can use an alcohol based hand sanitizer if soap and water are not available and if your hands are not visibly dirty. Avoid touching your eyes, nose, and mouth with unwashed hands. Use disposable paper towels to dry your hands. If not available, use dedicated cloth towels and replace them when they become wet.  Wear a facemask and gloves Wear a disposable facemask at all times in the room and gloves when you touch or have contact with the patients blood, body fluids, and/or secretions or excretions, such as sweat, saliva, sputum, nasal mucus, vomit, urine, or feces.  Ensure the mask fits over your nose and mouth tightly, and do not touch it during use. Throw out disposable facemasks and gloves after using them. Do not reuse. Wash your hands immediately after removing your facemask and gloves. If your personal clothing becomes contaminated, carefully remove clothing and launder. Wash your hands after handling contaminated clothing. Place all used disposable facemasks, gloves, and other waste in a lined container before  disposing them with other household waste. Remove gloves and wash your hands immediately after handling these items.  Do not share dishes, glasses, or other household items with the patient Avoid sharing household items. You should not share dishes, drinking glasses, cups, eating utensils, towels, bedding, or other items with a patient who is confirmed to have, or being evaluated for, COVID-19 infection. After the person uses these items, you should wash them thoroughly with soap and water.  Wash laundry thoroughly Immediately remove and wash clothes or bedding that have blood, body fluids, and/or secretions or excretions, such as sweat, saliva, sputum, nasal mucus, vomit, urine, or feces, on them. Wear gloves when handling laundry from the patient. Read and follow directions on labels of laundry or clothing items and detergent. In general, wash and dry with the warmest temperatures recommended on the label.  Clean all areas the individual has used often Clean all touchable surfaces, such as counters, tabletops, doorknobs, bathroom fixtures, toilets, phones, keyboards, tablets, and bedside tables, every day. Also, clean any surfaces that may have blood, body fluids, and/or secretions or excretions on them. Wear gloves when cleaning surfaces the patient has come in contact with. Use a diluted bleach solution (e.g., dilute bleach with 1 part bleach and 10 parts water) or a household disinfectant with a label that says EPA-registered for coronaviruses. To make a  bleach solution at home, add 1 tablespoon of bleach to 1 quart (4 cups) of water. For a larger supply, add  cup of bleach to 1 gallon (16 cups) of water. Read labels of cleaning products and follow recommendations provided on product labels. Labels contain instructions for safe and effective use of the cleaning product including precautions you should take when applying the product, such as wearing gloves or eye protection and making sure you  have good ventilation during use of the product. Remove gloves and wash hands immediately after cleaning.  Monitor yourself for signs and symptoms of illness Caregivers and household members are considered close contacts, should monitor their health, and will be asked to limit movement outside of the home to the extent possible. Follow the monitoring steps for close contacts listed on the symptom monitoring form.   ? If you have additional questions, contact your local health department or call the epidemiologist on call at [phone] (available 24/7). ? This guidance is subject to change. For the most up-to-date guidance from [facility], please refer to their website: [email or web address]

## 2018-08-02 NOTE — ED Provider Notes (Signed)
[facility] EMERGENCY DEPARTMENT Provider Note   CSN: [id number] Arrival date & time: 08/02/18  1351    History   Chief Complaint Chief Complaint  Patient presents with  . Chest Pain  . Cough    HPI Paula Villegas is a 43 y.o. female.     HPI Patient presents with multiple complaints. She notes that she has felt poorly for about 2 months, has been seen here once during that time period. She notes that in addition to her 21-month history of generalized body discomfort, she has, over the 2 past weeks felt particularly worse. She has new nausea, new dizziness. No reported fever, no vomiting. She did have loose stool, though that was 2 weeks ago. Patient has no new medication, though she was prescribed something for her illness by another clinic, she has not yet obtained that medication.  Past Medical History:  Diagnosis Date  . Abnormal vaginal bleeding   . Bradycardia   . BV (bacterial vaginosis)   . GERD (gastroesophageal reflux disease)   . IUD migration (HCC)   . Ovarian cyst 2014  . Preterm labor    PTL x2, PTD x1    Patient Active Problem List   Diagnosis Date Noted  . Hypothyroidism 12/14/2017  . Intractable back pain 06/10/2016  . BV (bacterial vaginosis) 06/10/2016  . Abnormal uterine bleeding 06/10/2016  . Bradycardia 06/10/2016  . Sciatica of right side   . Ovarian cyst 03/23/2012  . Appendicitis 05/22/2011  . 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 ANEMIA-NOS 12/14/2008  . BILIARY COLIC 12/14/2008    Past Surgical History:  Procedure Laterality Date  . APPENDECTOMY  2014   Laparoscopic  . INTRAUTERINE DEVICE INSERTION  2015  . LAPAROSCOPIC CHOLECYSTECTOMY  2012     OB History    Gravida  5   Para  4   Term  3   Preterm  1   AB  1   Living  4     SAB  1   TAB      Ectopic      Multiple      Live Births  4            Home Medications    Prior to Admission medications   Medication Sig Start Date  End Date Taking? Authorizing Provider  albuterol (PROVENTIL HFA;VENTOLIN HFA) 108 (90 Base) MCG/ACT inhaler Inhale 1-2 puffs into the lungs every 4 (four) hours as needed for wheezing or shortness of breath. 05/23/18   Isa Rankin, MD  benzonatate (TESSALON) 200 MG capsule Take 1 capsule (200 mg total) by mouth 3 (three) times daily as needed for cough. Patient not taking: Reported on 07/25/2018 05/23/18   Isa Rankin, MD  brompheniramine-pseudoephedrine-DM 30-2-10 MG/5ML syrup Take 5 mLs by mouth 4 (four) times daily as needed. Patient not taking: Reported on 06/03/2018 05/25/18   Arlyn Dunning, PA-C  cetirizine (ZYRTEC) 10 MG tablet Take 1 tablet (10 mg total) by mouth daily. 07/25/18   Julieanne Manson, MD  ibuprofen (ADVIL,MOTRIN) 200 MG tablet Take 200 mg by mouth every 6 (six) hours as needed.    [provider]  levothyroxine (SYNTHROID, LEVOTHROID) 50 MCG tablet 1 tab by mouth on empty stomach once daily. 04/05/18   Julieanne Manson, MD  oseltamivir (TAMIFLU) 75 MG capsule Take 1 capsule (75 mg total) by mouth every 12 (twelve) hours. Patient not taking: Reported on 06/03/2018 05/23/18   Isa Rankin, MD  [facility]  INTRAUTERINE COPPER IUD IUD 1 each by Intrauterine route once. Placed 07-04-2013 at [facility]. Instructed to remove no later than 03/2023    [provider]    Family History Family History  Problem Relation Age of Onset  . Hypertension Mother   . Cancer Mother 51       cervical  . Heart disease Mother        Died today, 2017/08/01 when patient in for CPE--MI related to alcohol abuse.  . Asthma Brother   . Diabetes Brother   . Diabetes Father   . Alcohol abuse Father   . Heart disease Father   . Seizures Brother 34       post traumatic brain injury --pedestrian hit by car  . Hearing loss Neg Hx     Social History Social History   Tobacco Use  . Smoking status: Never Smoker  . Smokeless tobacco: Never Used  Substance Use Topics  .  Alcohol use: Yes    Comment: 06/2017:  couple of drinks monthly  . Drug use: No     Allergies   Patient has no known allergies.   Review of Systems Review of Systems  Constitutional:       Per HPI, otherwise negative  HENT:       Per HPI, otherwise negative  Respiratory:       Per HPI, otherwise negative  Cardiovascular:       Per HPI, otherwise negative  Gastrointestinal: Negative for vomiting.  Endocrine:       Negative aside from HPI  Genitourinary:       Neg aside from HPI   Musculoskeletal:       Per HPI, otherwise negative  Skin: Negative.   Neurological: Positive for dizziness and weakness. Negative for syncope.     Physical Exam Updated Vital Signs BP 119/77   Pulse 97   Temp 100.2 F (37.9 C) (Oral)   Resp (!) 29   SpO2 96%   Physical Exam Vitals signs and nursing note reviewed.  Constitutional:      General: She is not in acute distress.    Appearance: She is well-developed.  HENT:     Head: Normocephalic and atraumatic.  Eyes:     Conjunctiva/sclera: Conjunctivae normal.  Cardiovascular:     Rate and Rhythm: Normal rate and regular rhythm.  Pulmonary:     Effort: Pulmonary effort is normal. No respiratory distress.     Breath sounds: Normal breath sounds. No stridor.  Abdominal:     General: There is no distension.     Tenderness: There is no abdominal tenderness. There is no rebound.  Skin:    General: Skin is warm and dry.  Neurological:     Mental Status: She is alert and oriented to person, place, and time.     Cranial Nerves: No cranial nerve deficit.      ED Treatments / Results  Labs (all labs ordered are listed, but only abnormal results are displayed) Labs Reviewed  CBC WITH DIFFERENTIAL/PLATELET - Abnormal; Notable for the following components:      Result Value   Hemoglobin 11.3 (*)    HCT 35.3 (*)    MCV 77.6 (*)    MCH 24.8 (*)    RDW 16.5 (*)    All other components within normal limits  SARS CORONAVIRUS 2  (HOSPITAL ORDER, PERFORMED IN [facility])  COMPREHENSIVE METABOLIC PANEL  ETHANOL  LIPASE, BLOOD  URINALYSIS, ROUTINE W REFLEX MICROSCOPIC  TYPE AND SCREEN    EKG None  Radiology Dg Chest Port 1 View  Result Date: 08/02/2018 CLINICAL DATA:  43 year old with chest pain and cough.  Fever. EXAM: PORTABLE CHEST 1 VIEW COMPARISON:  05/25/2018 FINDINGS: New patchy densities in the mid and lower left chest. Few linear and peripheral densities in the mid and lower right lung. Heart size is prominent for size but could be accentuated by the AP portable technique. Negative for pneumothorax. Trachea is midline. Bone structures are unremarkable. Evidence for cholecystectomy clips. IMPRESSION: New bilateral lung densities, left side greater than right. Findings are suggestive for bilateral pneumonia. Electronically Signed   By: Richarda OverlieAdam  Henn M.D.   On: 08/02/2018 15:10    Procedures Procedures (including critical care time)  Medications Ordered in ED Medications  sodium chloride 0.9 % bolus 1,000 mL (1,000 mLs Intravenous New Bag/Given 08/02/18 1423)  ondansetron (ZOFRAN) injection 4 mg (4 mg Intravenous Given 08/02/18 1424)  ketorolac (TORADOL) 30 MG/ML injection 15 mg (15 mg Intravenous Given 08/02/18 1424)     Initial Impression / Assessment and Plan / ED Course  I have reviewed the triage vital signs and the nursing notes.  Pertinent labs & imaging results that were available during my care of the patient were reviewed by me and considered in my medical decision making (see chart for details).        3:14 PM Patient in no distress.  Initial findings notable for bilateral x-ray abnormalities consistent with pneumonia. Patient remains awake and alert, with no hypotension, no hypoxia, though she is febrile.  She will receive azithromycin.   Update:, Remaining labs notable for positive coronavirus test.     Patient is awake, alert, states that she feels better on repeat  exam, but has positive coronavirus test, and x-ray evidence concerning for pneumonia. Patient is receiving fluids, azithromycin, and will require repeat evaluation, and disposition.Repeat eval will be performed by Dr. Juleen ChinaKohut.     Paula Villegas was evaluated in Emergency Department on 08/02/2018 for the symptoms described in the history of present illness. She was evaluated in the context of the global COVID-19 pandemic, which necessitated consideration that the patient might be at risk for infection with the SARS-CoV-2 virus that causes COVID-19. Institutional protocols and algorithms that pertain to the evaluation of patients at risk for COVID-19 are in a state of rapid change based on information released by regulatory bodies including the CDC and federal and state organizations. These policies and algorithms were followed during the patient's care in the ED.   Final Clinical Impressions(s) / ED Diagnoses   Final diagnoses:  Pneumonia due to COVID-19 virus     Gerhard MunchLockwood, Marcee Jacobs, MD 08/02/18 1556

## 2018-08-03 ENCOUNTER — Telehealth: Payer: Self-pay

## 2018-08-03 LAB — ABO/RH: ABO/RH(D): O POS

## 2018-08-03 NOTE — Telephone Encounter (Signed)
Called patient to see how she is feeling. Patient states she is feeling better. No fever today but she does still have a cough. Patient states her breathing is a lot better today. Per patient she is okay.  To Dr. Delrae Alfred for [facility]

## 2018-08-05 NOTE — Telephone Encounter (Signed)
Called patient to see how she is feeling. Patient states her cough and breathing are a lot better and no fever, but she is still having the headache and fatigue and weakness.. informed patient if she feels worse to call over the weekend and Dr. Delrae Alfred has on call phone. Patient verbalized understanding.

## 2018-08-15 ENCOUNTER — Telehealth: Payer: Self-pay | Admitting: Internal Medicine

## 2018-08-15 MED ORDER — ALBUTEROL SULFATE HFA 108 (90 BASE) MCG/ACT IN AERS: 2.0000 | INHALATION_SPRAY | Freq: Four times a day (QID) | RESPIRATORY_TRACT | 0 refills | Status: DC | PRN

## 2018-08-15 NOTE — Telephone Encounter (Signed)
Discussed how to use the inhaler

## 2018-08-24 LAB — SPECIMEN STATUS REPORT

## 2018-08-24 LAB — HGB A1C W/O EAG: Hgb A1c MFr Bld: 6.2 % — ABNORMAL HIGH (ref 4.8–5.6)

## 2018-08-25 ENCOUNTER — Telehealth: Payer: Self-pay | Admitting: *Deleted

## 2018-08-25 NOTE — Telephone Encounter (Signed)
I called [employer] for Spanish [phone] interpreter.  I called to see if she was willing to donate plasma to assist in the recovery of others with COVID-19 since she is recovered from COVID-19 can use her antibodies.     She is willing to donate.  I gave her the [employer] web site and instructions on what to do.   I let her know someone would be calling her to set up the donation time.  I thanked her for being willing to donate plasma.

## 2018-09-07 ENCOUNTER — Other Ambulatory Visit: Payer: Self-pay | Admitting: Internal Medicine

## 2018-09-07 ENCOUNTER — Other Ambulatory Visit: Payer: Self-pay

## 2018-09-07 ENCOUNTER — Encounter: Payer: Self-pay | Admitting: Internal Medicine

## 2018-09-07 ENCOUNTER — Ambulatory Visit: Payer: Self-pay | Admitting: Internal Medicine

## 2018-09-07 VITALS — BP 122/78 | HR 74 | Resp 12 | Ht 61.0 in | Wt 146.0 lb

## 2018-09-07 DIAGNOSIS — R0789 Other chest pain: Secondary | ICD-10-CM

## 2018-09-07 DIAGNOSIS — K219 Gastro-esophageal reflux disease without esophagitis: Secondary | ICD-10-CM

## 2018-09-07 MED ORDER — FAMOTIDINE 20 MG PO TABS: ORAL_TABLET | ORAL | 0 refills | Status: DC

## 2018-09-07 MED ORDER — DICLOFENAC SODIUM 1 % TD GEL: 2.0000 g | Freq: Four times a day (QID) | TRANSDERMAL | 1 refills | Status: DC

## 2018-09-07 NOTE — Progress Notes (Signed)
Subjective:    Patient ID: Paula Villegas, female   DOB: [birth year], 43 y.o.   MRN: [id number]   HPI   First started with symptoms suggestive of COVID19 in the first 2 days of March.  Had been around others with the flu.  She is not sure if they were actually tested. Described central chest tightness as well as scratch throat and minimal runny nose without fever on ED visit 3.2.2020. Had coarse BS in ED and given albuterol neb.  Was diagnosed as flu like illness, though never tested. Tamiflu prescribed Seen in urgent care on 3.4.2020 for cough, fatigue, headache and continued symptoms of previous ED visit and worsening dyspnea. ECG, cardiac enzymes, CXR, CMP and CBC unremarkable.   Given prednisone burst and taper without improvement. Allergies were also a part of her symptoms as she noted before being seen here, her symptoms worsened just before coming in due to grass mowing outside.   Seen here 06/03/2018 and Zyrtec 10 mg daily added.  Felt to have costochondritis from coughing and told to take Ibuprofen for chest pain.  Improved with symptoms of dyspnea and chest tightness subsequently and then developed fever, headaches, chest tightness and cough again with body aches on 07/22/2018. Felt to likely have COVID 19 and to self isolate  Ultimately, developed worsening dyspnea and GI symptoms with time and tested for COVID 19 at an ED visit on 08/02/2018 and tested positive.  On CXR, was found to have bilateral patchy densities consistent with pneumonia. No hypoxia. Discharged on Azithromycin.   GI symptoms resolved within 24 hours--these were mild. States her fever went away after about 5 days.   Headache was gone about 7 days after this visit. Body aches took about 2 weeks to resolve.  States she is still a bit dyspneic and occasionally has a cough, especially in the morning, but that is improved.  While her chest pain is much better, she is having a continuous pressure pain on  anterior bilateral chest with burning up to her low neck.  No radiation to jaw, back or shoulder.  Worse when she awakens in morning.  No bad taste in mouth.   No melena or hematochezia.   She also notes her dry cough and breathing is worse in the morning, though significantly better than when really ill. She coughs a lot with exercise and cannot breathe easily.  Has not been able to dance or exercise yet due to this.   When she tries to exercise, her chest pain does not worsen--just the cough and breathing.   No one else in her family ever exhibited illness, though her son, age 45 tested positive.  Son's girlfriend who is at the home as well, tested negative.  Her daughters never exhibited symptoms and were never tested.   She has not taken anything for chest pain.  Only drinking ginger and lemon tea.     Current Meds  Medication Sig  . cetirizine (ZYRTEC) 10 MG tablet Take 1 tablet (10 mg total) by mouth daily.  Marland Kitchen. levothyroxine (SYNTHROID, LEVOTHROID) 50 MCG tablet 1 tab by mouth on empty stomach once daily. (Patient taking differently: Take 50 mcg by mouth daily. )   No Known Allergies   Review of Systems    Objective:   BP 122/78 (BP Location: Left Arm, Patient Position: Sitting, Cuff Size: Normal)   Pulse 74   Resp 12   Ht 5\' 1"  (1.549 m)   Wt 146 lb (66.2 kg)  LMP 07/24/2018   BMI 27.59 kg/m   SaO2 99% on RA  Physical Exam  NAD HEENT: PERRL, EOMI, TMs pearly gray, throat without injection or cobbling.   Neck:  Supple, No adenopathy Chest:  CTA. Very tender over joint margins bilaterally of ribs with sternum--reproduces her chest pain. CV:  RRR without murmur or rub.  Radial and DP pulses normal and equal Abd:  S, NT, No HSM or mass.   Assessment & Plan   1.  Chest wall pain:  Diclofenac 1% gel 2-4 times daily to the area.  Use Albuterol as needed before exercise and see if helps.  2.  GERD:  Cannot rule this out as a factor with her symptoms.  Famotidine 40 mg  at bedtime for 2 weeks.   Elevated HOB Avoid tomatoes, onions, caffeine. Does not use other GERD exacerbators.  Call if not gradually improving.

## 2018-09-07 NOTE — Patient Instructions (Addendum)
Use Albuterol before you exercise. Elevate HOB

## 2018-09-07 NOTE — Telephone Encounter (Signed)
Seen today. 

## 2018-11-23 ENCOUNTER — Encounter: Payer: Self-pay | Admitting: Internal Medicine

## 2018-12-26 ENCOUNTER — Ambulatory Visit: Payer: Self-pay | Admitting: Internal Medicine

## 2018-12-26 ENCOUNTER — Other Ambulatory Visit: Payer: Self-pay

## 2018-12-26 ENCOUNTER — Encounter: Payer: Self-pay | Admitting: Internal Medicine

## 2018-12-26 VITALS — BP 102/60 | HR 66 | Resp 12 | Ht 61.0 in | Wt 151.0 lb

## 2018-12-26 DIAGNOSIS — R7303 Prediabetes: Secondary | ICD-10-CM

## 2018-12-26 DIAGNOSIS — E663 Overweight: Secondary | ICD-10-CM | POA: Insufficient documentation

## 2018-12-26 DIAGNOSIS — D649 Anemia, unspecified: Secondary | ICD-10-CM

## 2018-12-26 DIAGNOSIS — Z0000 Encounter for general adult medical examination without abnormal findings: Secondary | ICD-10-CM

## 2018-12-26 DIAGNOSIS — E039 Hypothyroidism, unspecified: Secondary | ICD-10-CM

## 2018-12-26 NOTE — Progress Notes (Signed)
Subjective:    Patient ID: Paula Villegas, female   DOB: 07-Sep-1975, 43 y.o.   MRN: [id number]   HPI   CPE without pap  1.  Pap:  Last 08/03/17 and normal.    2.  Mammogram:  Last mammogram 5.22.2019 and normal.  No family history of breast cancer.  Discussed risks and prefers to defer mammogram this year.  3.  Osteoprevention: Does not eat more than one serving of dairy daily.  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  Has 4 children, 2 still in school on online learning.  4.  Guaiac Cards:  Performed 07/26/2017 and negative x 3  5.  Colonoscopy: Never.  No family history of colon cancer  6.  Immunizations:   Immunization History  Administered Date(s) Administered  . Td 12/13/2001  . Tdap 03/13/2013     7.  Glucose/Cholesterol:  Prediabetes with A1C of 6.2% in May 2020 and her cholesterol was fine last year. Lipid Panel     Component Value Date/Time   CHOL 168 07/13/2017 0925   TRIG 78 07/13/2017 0925   HDL 51 07/13/2017 0925   LDLCALC 101 (H) 07/13/2017 0925   LABVLDL 16 07/13/2017 0925     Current Meds  Medication Sig  . cetirizine (ZYRTEC) 10 MG tablet Take 1 tablet (10 mg total) by mouth daily.  . diclofenac sodium (VOLTAREN) 1 % GEL Apply 2 g topically 4 (four) times daily.  Marland Kitchen levothyroxine (SYNTHROID, LEVOTHROID) 50 MCG tablet 1 tab by mouth on empty stomach once daily. (Patient taking differently: Take 50 mcg by mouth daily. )   No Known Allergies   Past Medical History:  Diagnosis Date  . Abnormal vaginal bleeding   . Bradycardia   . BV (bacterial vaginosis)   . GERD (gastroesophageal reflux disease)   . IUD migration   . Ovarian cyst 2014  . Prediabetes 07/2018  . Preterm labor    PTL x2, PTD x1   Past Surgical History:  Procedure Laterality Date  . APPENDECTOMY  2014   Laparoscopic  . INTRAUTERINE DEVICE INSERTION  2015   ?copper--states good for 10 years  . LAPAROSCOPIC CHOLECYSTECTOMY  2012    Family History  Problem Relation Age of Onset  . Hypertension Mother   . Cancer Mother 84       cervical  . Heart disease Mother        Died today, 08-03-17 when patient in for CPE--MI related to alcohol abuse.  . Asthma Brother   . Diabetes Brother   . Diabetes Father   . Alcohol abuse Father   . Heart disease Father   . Seizures Brother 34       post traumatic brain injury --pedestrian hit by car  . Hearing loss Neg Hx    Social History   Socioeconomic History  . Marital status: Divorced    Spouse name: Not on file  . Number of children: 4  . Years of education: 81  . Highest education level: Not on file  Occupational History  . 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  Social Needs  . Financial resource strain: Not very hard  . Food insecurity    Worry: Sometimes true    Inability: Sometimes true  . Transportation needs    Medical: No    Non-medical: No  Tobacco Use  . Smoking status: Never Smoker  .  Smokeless tobacco: Never Used  Substance and Sexual Activity  . Alcohol use: Yes    Comment: 2020:  2 drinks monthly on average  . Drug use: No  . Sexual activity: Yes    Birth control/protection: I.U.D.    Comment: [employer]ag[employer] Co[employer] T,  Good until 03/2023, placed 07/04/2013  Lifestyle  . Physical activity    Days per week: 0 days    Minutes per session: 0 min  . Stress: Only a little  Relationships  . Social Herbalist on phone: Not on file    Gets together: Not on file    Attends religious service: Never    Active member of club or organization: No    Attends meetings of clubs or organizations: Never    Relationship status: Not on file  . Intimate partner violence    Fear of current or ex partner: No    Emotionally abused: No    Physically abused: No    Forced sexual activity: No  Other Topics Concern  . Not on file  Social History Narrative   [place]   Lives with middle and  youngest daughter live with her now.  .   Father of children lives in [place], they are no longer a couple.   Older children live on their own now.   Works in Retail banker at [employer], but also Product manager.     Review of Systems  Constitutional: Positive for fatigue (Since Covid 19 infection in May, just feels fatigued much of the time.  Sometimes, feels she cannot breathe well still. ). Negative for appetite change and fever.  HENT: Negative for dental problem, ear pain, hearing loss, rhinorrhea, sinus pain, sneezing and sore throat.   Eyes: Negative for visual disturbance.  Respiratory: Positive for cough (Sometimes with weather change and coldness.   More prounounced since COVID in the spring.) and shortness of breath (Mainly with temperature drop and if walking around a lot with job.  Has to stop and take a deep breath).   Cardiovascular: Positive for chest pain (Again, with repiratory issues with coldness.). Negative for palpitations and leg swelling.  Gastrointestinal: Negative for abdominal pain, blood in stool (No melena), constipation and diarrhea.  Genitourinary: Negative for dysuria, menstrual problem and vaginal discharge.  Musculoskeletal:       Using diclofenac on legs and her chest, the latter since COVID 19 and when has chest discomfort--helps.  Skin: Negative for rash.  Neurological: Negative for weakness and numbness.  Psychiatric/Behavioral: Negative for dysphoric mood. The patient is not nervous/anxious.       Objective:   BP 102/60 (BP Location: Left Arm, Patient Position: Sitting, Cuff Size: Normal)   Pulse 66   Resp 12   Ht 5\' 1"  (1.549 m)   Wt 151 lb (68.5 kg)   LMP 11/28/2018 (Approximate)   BMI 28.53 kg/m   Physical Exam  Constitutional: She is oriented to person, place, and time. She appears well-developed and well-nourished.  HENT:  Head: Normocephalic and atraumatic.  Right Ear: Hearing, tympanic membrane, external ear and ear canal  normal.  Left Ear: Hearing, tympanic membrane, external ear and ear canal normal.  Nose: Mucosal edema present.  Mouth/Throat: Uvula is midline, oropharynx is clear and moist and mucous membranes are normal.  Eyes: Pupils are equal, round, and reactive to light. Conjunctivae and EOM are normal.  Unable to get a good focus on discs bilaterally  Neck: Normal range of  motion and full passive range of motion without pain. Neck supple. No thyromegaly present.  Cardiovascular: Normal rate, regular rhythm, S1 normal and S2 normal. Exam reveals no S3, no S4 and no friction rub.  No murmur heard. No carotid bruits.  Carotid, radial, femoral, DP and PT pulses normal and equal.   Pulmonary/Chest: Effort normal and breath sounds normal. Right breast exhibits no inverted nipple, no mass, no nipple discharge, no skin change and no tenderness. Left breast exhibits no inverted nipple, no mass, no nipple discharge, no skin change and no tenderness.  Abdominal: Soft. Bowel sounds are normal. She exhibits no mass. There is no hepatosplenomegaly. There is no abdominal tenderness. There is no guarding.  Genitourinary:    Vagina normal.  Rectum:     Guaiac result negative.     No rectal mass.     Genitourinary Comments: Normal external female genitalia.  No uterine or adnexal mass or tenderness.   Musculoskeletal: Normal range of motion.  Lymphadenopathy:       Head (right side): No submental and no submandibular adenopathy present.       Head (left side): No submental and no submandibular adenopathy present.    She has no cervical adenopathy.    She has no axillary adenopathy.       Right: No inguinal and no supraclavicular adenopathy present.       Left: No inguinal and no supraclavicular adenopathy present.  Neurological: She is alert and oriented to person, place, and time. She has normal strength and normal reflexes. No cranial nerve deficit or sensory deficit. Coordination and gait normal.  Skin: Skin is  warm. No rash noted.  Psychiatric: She has a normal mood and affect. Her speech is normal and behavior is normal. Judgment and thought content normal. Cognition and memory are normal.     Assessment & Plan  1.  CPE without pap Guaiac cards X 3 to return in 2 weeks. Call if she changes mind about skipping mammogram this year. Influenza vaccine Rx for [facility]-gratis CMP  2.  Prediabetes/overweight:  FLP, A1C, CMP  3.  Hypothyroidism:  TSH  4.  Mild anemia:  CBC.  History of heavy periods, but not an issue now.  5.  COVID19 in Spring.  Still with fatigue and intermittent dypsnea.  No signs/symptoms of cardiac failure

## 2018-12-26 NOTE — Patient Instructions (Signed)
Tome un vaso de agua antes de cada comida Tome un minimo de 6 a 8 vasos de agua diarios Coma tres veces al dia Coma una proteina y Ardelia Mems grasa saludable con comida.  (huevos, pescado, pollo, pavo, y limite carnes rojas Coma 5 porciones diarias de legumbres.  Mezcle los colores Coma 2 porciones diarias de frutas con cascara cuando sea comestible Use platos pequeos Suelte su tenedor o cuchara despues de cada mordida hata que se mastique y se trague Come en la mesa con amigos o familiares por lo menos una vez al dia [place] la televisin y aparatos electrnicos durante la comida  Su objetivo debe ser perder una libra por semana  Estudios recientes indican que las personas quienes consumen todos de sus calorias durante 12 horas se bajan de pesocon Mas eficiencia.  Por ejemplo, si Usted come su primera comida a las 7:00 a.m., su comida final del dia se debe completar antes de las 7:00 p.m.  3 cups milk or serving of cheese or yogurt daily for adequate calcium and vitamin D Get outside and walk

## 2018-12-27 LAB — COMPREHENSIVE METABOLIC PANEL
ALT: 10 IU/L (ref 0–32)
AST: 13 IU/L (ref 0–40)
Albumin/Globulin Ratio: 1.5 (ref 1.2–2.2)
Albumin: 4.4 g/dL (ref 3.8–4.8)
Alkaline Phosphatase: 76 IU/L (ref 39–117)
BUN/Creatinine Ratio: 12 (ref 9–23)
BUN: 11 mg/dL (ref 6–24)
Bilirubin Total: 0.3 mg/dL (ref 0.0–1.2)
CO2: 18 mmol/L — ABNORMAL LOW (ref 20–29)
Calcium: 9 mg/dL (ref 8.7–10.2)
Chloride: 107 mmol/L — ABNORMAL HIGH (ref 96–106)
Creatinine, Ser: 0.93 mg/dL (ref 0.57–1.00)
GFR calc Af Amer: 86 mL/min/{1.73_m2} (ref >59)
GFR calc non Af Amer: 75 mL/min/{1.73_m2} (ref >59)
Globulin, Total: 2.9 g/dL (ref 1.5–4.5)
Glucose: 103 mg/dL — ABNORMAL HIGH (ref 65–99)
Potassium: 5 mmol/L (ref 3.5–5.2)
Sodium: 139 mmol/L (ref 134–144)
Total Protein: 7.3 g/dL (ref 6.0–8.5)

## 2018-12-27 LAB — CBC WITH DIFFERENTIAL/PLATELET
Basophils Absolute: 0.1 10*3/uL (ref 0.0–0.2)
Basos: 1 %
EOS (ABSOLUTE): 0.1 10*3/uL (ref 0.0–0.4)
Eos: 1 %
Hematocrit: 37.8 % (ref 34.0–46.6)
Hemoglobin: 12.2 g/dL (ref 11.1–15.9)
Immature Grans (Abs): 0 10*3/uL (ref 0.0–0.1)
Immature Granulocytes: 0 %
Lymphocytes Absolute: 2.2 10*3/uL (ref 0.7–3.1)
Lymphs: 30 %
MCH: 26.1 pg — ABNORMAL LOW (ref 26.6–33.0)
MCHC: 32.3 g/dL (ref 31.5–35.7)
MCV: 81 fL (ref 79–97)
Monocytes Absolute: 0.6 10*3/uL (ref 0.1–0.9)
Monocytes: 8 %
Neutrophils Absolute: 4.4 10*3/uL (ref 1.4–7.0)
Neutrophils: 60 %
Platelets: 332 10*3/uL (ref 150–450)
RBC: 4.67 x10E6/uL (ref 3.77–5.28)
RDW: 15.1 % (ref 11.7–15.4)
WBC: 7.4 10*3/uL (ref 3.4–10.8)

## 2018-12-27 LAB — LIPID PANEL W/O CHOL/HDL RATIO
Cholesterol, Total: 197 mg/dL (ref 100–199)
HDL: 47 mg/dL (ref >39)
LDL Chol Calc (NIH): 133 mg/dL — ABNORMAL HIGH (ref 0–99)
Triglycerides: 93 mg/dL (ref 0–149)
VLDL Cholesterol Cal: 17 mg/dL (ref 5–40)

## 2018-12-27 LAB — HGB A1C W/O EAG: Hgb A1c MFr Bld: 5.9 % — ABNORMAL HIGH (ref 4.8–5.6)

## 2018-12-27 LAB — TSH: TSH: 3.18 u[IU]/mL (ref 0.450–4.500)

## 2019-07-26 ENCOUNTER — Encounter (HOSPITAL_COMMUNITY): Payer: Self-pay | Admitting: Emergency Medicine

## 2019-07-26 ENCOUNTER — Other Ambulatory Visit: Payer: Self-pay

## 2019-07-26 ENCOUNTER — Ambulatory Visit (HOSPITAL_COMMUNITY)
Admission: EM | Admit: 2019-07-26 | Discharge: 2019-07-26 | Disposition: A | Discharge status: Home / Self Care | Payer: Self-pay | Attending: Urgent Care | Admitting: Urgent Care

## 2019-07-26 DIAGNOSIS — M545 Low back pain, unspecified: Secondary | ICD-10-CM

## 2019-07-26 DIAGNOSIS — M7918 Myalgia, other site: Secondary | ICD-10-CM

## 2019-07-26 DIAGNOSIS — S39012A Strain of muscle, fascia and tendon of lower back, initial encounter: Secondary | ICD-10-CM

## 2019-07-26 DIAGNOSIS — M2569 Stiffness of other specified joint, not elsewhere classified: Secondary | ICD-10-CM

## 2019-07-26 DIAGNOSIS — Z3202 Encounter for pregnancy test, result negative: Secondary | ICD-10-CM

## 2019-07-26 LAB — POCT URINALYSIS DIP (DEVICE)
Bilirubin Urine: NEGATIVE
Glucose, UA: NEGATIVE mg/dL
Ketones, ur: NEGATIVE mg/dL
Leukocytes,Ua: NEGATIVE
Nitrite: NEGATIVE
Protein, ur: NEGATIVE mg/dL
Specific Gravity, Urine: >=1.03 (ref 1.005–1.030)
Urobilinogen, UA: 0.2 mg/dL (ref 0.0–1.0)
pH: 5.5 (ref 5.0–8.0)

## 2019-07-26 LAB — POC URINE PREG, ED: Preg Test, Ur: NEGATIVE

## 2019-07-26 MED ORDER — METHYLPREDNISOLONE SODIUM SUCC 125 MG IJ SOLR
INTRAMUSCULAR | Status: AC
  Filled 2019-07-26: qty 2

## 2019-07-26 MED ORDER — TIZANIDINE HCL 4 MG PO TABS: 4.0000 mg | ORAL_TABLET | Freq: Every evening | ORAL | 0 refills | Status: DC | PRN

## 2019-07-26 MED ORDER — METHYLPREDNISOLONE SODIUM SUCC 125 MG IJ SOLR
125.0000 mg | Freq: Once | INTRAMUSCULAR | Status: AC
  Administered 2019-07-26: 15:00:00 125 mg via INTRAMUSCULAR

## 2019-07-26 MED ORDER — MELOXICAM 7.5 MG PO TABS
7.5000 mg | ORAL_TABLET | Freq: Every day | ORAL | 0 refills | Status: DC
Start: 2019-07-26 — End: 2021-09-01

## 2019-07-26 NOTE — ED Provider Notes (Signed)
[facility]   MRN: [id number] DOB: 09/06/1975  Subjective:   Paula Villegas is a 44 y.o. female presenting for 6-day history of persistent and worsening severe constant low back pain worse over the left side goes down to her mid buttock and makes it very difficult for her to sit due to buttock pain and worsening back pain.  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  Has a history of intractable back pain, sciatica.  CT of the low back was negative in 2018.  Denies any recent falls, trauma, incontinence, hematuria, dysuria.  Denies history of renal stones.  No current facility-administered medications for this encounter.  Current Outpatient Medications:  .  albuterol (VENTOLIN HFA) 108 (90 Base) MCG/ACT inhaler, Inhale 2 puffs into the lungs every 6 (six) hours as needed for wheezing or shortness of breath. (Patient not taking: Reported on 09/07/2018), Disp: 1 Inhaler, Rfl: 0 .  cetirizine (ZYRTEC) 10 MG tablet, Take 1 tablet (10 mg total) by mouth daily., Disp: 30 tablet, Rfl: 11 .  diclofenac sodium (VOLTAREN) 1 % GEL, Apply 2 g topically 4 (four) times daily., Disp: 100 g, Rfl: 1 .  famotidine (PEPCID) 20 MG tablet, 2 tabs by mouth at bedtime daily. (Patient not taking: Reported on 12/26/2018), Disp: 30 tablet, Rfl: 0 .  levothyroxine (SYNTHROID, LEVOTHROID) 50 MCG tablet, 1 tab by mouth on empty stomach once daily. (Patient taking differently: Take 50 mcg by mouth daily. ), Disp: 30 tablet, Rfl: 11   No Known Allergies  Past Medical History:  Diagnosis Date  . Abnormal vaginal bleeding   . Bradycardia   . BV (bacterial vaginosis)   . GERD (gastroesophageal reflux disease)   . IUD migration   . Ovarian cyst 2014  . Prediabetes 07/2018  . Preterm labor    PTL x2, PTD x1     Past Surgical History:  Procedure Laterality Date  . APPENDECTOMY  2014   Laparoscopic  . INTRAUTERINE DEVICE  INSERTION  2015   ?copper--states good for 10 years  . LAPAROSCOPIC CHOLECYSTECTOMY  2012    Family History  Problem Relation Age of Onset  . Hypertension Mother   . Cancer Mother 59       cervical  . Heart disease Mother        Died today, 07/10/17 when patient in for CPE--MI related to alcohol abuse.  . Asthma Brother   . Diabetes Brother   . Diabetes Father   . Alcohol abuse Father   . Heart disease Father   . Seizures Brother 34       post traumatic brain injury --pedestrian hit by car  . Hearing loss Neg Hx     Social History   Tobacco Use  . Smoking status: Never Smoker  . Smokeless tobacco: Never Used  Substance Use Topics  . Alcohol use: Yes    Comment: 2020:  2 drinks monthly on average  . Drug use: No    ROS   Objective:   Vitals: BP (!) 119/58   Pulse (!) 53   Temp 98.4 F (36.9 C) (Oral)   Resp 18   LMP 07/22/2019   SpO2 100%   Physical Exam Constitutional:      General: She is in acute distress.     Appearance: Normal appearance. She is well-developed. She is not ill-appearing, toxic-appearing or diaphoretic.  HENT:     Head: Normocephalic and atraumatic.  Nose: Nose normal.     Mouth/Throat:     Mouth: Mucous membranes are moist.     Pharynx: Oropharynx is clear.  Eyes:     General: No scleral icterus.    Extraocular Movements: Extraocular movements intact.     Pupils: Pupils are equal, round, and reactive to light.  Cardiovascular:     Rate and Rhythm: Normal rate.  Pulmonary:     Effort: Pulmonary effort is normal.  Musculoskeletal:     Lumbar back: Spasms and tenderness present. No swelling, edema, deformity, signs of trauma, lacerations or bony tenderness. Decreased range of motion (In all directions worse with flexion and extension). Negative right straight leg raise test and negative left straight leg raise test. No scoliosis.       Back:  Skin:    General: Skin is warm and dry.  Neurological:     General: No focal  deficit present.     Mental Status: She is alert and oriented to person, place, and time.     Motor: No weakness.     Coordination: Coordination abnormal (difficulty bending at the low back, difficulty sitting, stands without an issues but changing positions is very difficult for patient).     Gait: Gait abnormal (walking slowly, gingerly favoring low back).     Deep Tendon Reflexes: Reflexes normal.  Psychiatric:        Mood and Affect: Mood normal.        Behavior: Behavior normal.        Thought Content: Thought content normal.        Judgment: Judgment normal.     Results for orders placed or performed during the hospital encounter of 07/26/19 (from the past 24 hour(s))  POCT urinalysis dip (device)     Status: Abnormal   Collection Time: 07/26/19  2:05 PM  Result Value Ref Range   Glucose, UA NEGATIVE NEGATIVE mg/dL   Bilirubin Urine NEGATIVE NEGATIVE   Ketones, ur NEGATIVE NEGATIVE mg/dL   Specific Gravity, Urine >=1.030 1.005 - 1.030   Hgb urine dipstick TRACE (A) NEGATIVE   pH 5.5 5.0 - 8.0   Protein, ur NEGATIVE NEGATIVE mg/dL   Urobilinogen, UA 0.2 0.0 - 1.0 mg/dL   Nitrite NEGATIVE NEGATIVE   Leukocytes,Ua NEGATIVE NEGATIVE  POC urine pregnancy     Status: None   Collection Time: 07/26/19  2:14 PM  Result Value Ref Range   Preg Test, Ur NEGATIVE NEGATIVE   CT Lumbar spine without contrast.  IMPRESSION: 1. No acute findings or explanation for the patient's symptoms. 2. 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 3. Stable small right adrenal adenoma and asymmetric sclerosis around the left sacroiliac joint.   Electronically Signed   By: Richardean Sale M.D.   On: 06/10/2016 16:40  Assessment and Plan :   PDMP not reviewed this encounter.  1. Acute left-sided low back pain, unspecified whether sciatica present   2. Back stiffness   3. Acute buttock pain   4. Strain of lumbar region,  initial encounter     IM Solu-Medrol in clinic, meloxicam and tizanidine as an outpatient.  Counseled on back care, daily adequate hydration.  At this time recommended against the emergency room imaging including CT or MRI.  Provided patient with information to [facility] in the event that her symptoms persist, she would benefit from having an MRI. Counseled patient on potential for adverse effects with medications  prescribed/recommended today, ER and return-to-clinic precautions discussed, patient verbalized understanding.    Wallis Bamberg, [place] 07/26/19 [id number]

## 2019-07-26 NOTE — ED Triage Notes (Signed)
PT reports severe left sided lower back pain that wraps around abdomen. Back pain started Thurdsay. Abdominal pain started today. Nausea yesterday. No dysuria.

## 2019-08-01 ENCOUNTER — Emergency Department (HOSPITAL_COMMUNITY): Payer: No Typology Code available for payment source

## 2019-08-01 ENCOUNTER — Emergency Department (HOSPITAL_COMMUNITY)
Admission: EM | Admit: 2019-08-01 | Discharge: 2019-08-01 | Disposition: A | Discharge status: Home / Self Care | Payer: No Typology Code available for payment source | Attending: Emergency Medicine | Admitting: Emergency Medicine

## 2019-08-01 ENCOUNTER — Other Ambulatory Visit: Payer: Self-pay

## 2019-08-01 ENCOUNTER — Encounter (HOSPITAL_COMMUNITY): Payer: Self-pay | Admitting: Emergency Medicine

## 2019-08-01 DIAGNOSIS — E039 Hypothyroidism, unspecified: Secondary | ICD-10-CM | POA: Insufficient documentation

## 2019-08-01 DIAGNOSIS — R7303 Prediabetes: Secondary | ICD-10-CM | POA: Insufficient documentation

## 2019-08-01 DIAGNOSIS — M5440 Lumbago with sciatica, unspecified side: Secondary | ICD-10-CM | POA: Insufficient documentation

## 2019-08-01 LAB — BASIC METABOLIC PANEL
Anion gap: 11 (ref 5–15)
BUN: 15 mg/dL (ref 6–20)
CO2: 22 mmol/L (ref 22–32)
Calcium: 8.9 mg/dL (ref 8.9–10.3)
Chloride: 105 mmol/L (ref 98–111)
Creatinine, Ser: 0.91 mg/dL (ref 0.44–1.00)
GFR calc Af Amer: >60 mL/min (ref >60)
GFR calc non Af Amer: >60 mL/min (ref >60)
Glucose, Bld: 105 mg/dL — ABNORMAL HIGH (ref 70–99)
Potassium: 3.9 mmol/L (ref 3.5–5.1)
Sodium: 138 mmol/L (ref 135–145)

## 2019-08-01 LAB — CBC
HCT: 39.5 % (ref 36.0–46.0)
Hemoglobin: 12.2 g/dL (ref 12.0–15.0)
MCH: 25.7 pg — ABNORMAL LOW (ref 26.0–34.0)
MCHC: 30.9 g/dL (ref 30.0–36.0)
MCV: 83.2 fL (ref 80.0–100.0)
Platelets: 331 10*3/uL (ref 150–400)
RBC: 4.75 MIL/uL (ref 3.87–5.11)
RDW: 19.5 % — ABNORMAL HIGH (ref 11.5–15.5)
WBC: 8.3 10*3/uL (ref 4.0–10.5)
nRBC: 0 % (ref 0.0–0.2)

## 2019-08-01 LAB — URINALYSIS, ROUTINE W REFLEX MICROSCOPIC
Bilirubin Urine: NEGATIVE
Glucose, UA: NEGATIVE mg/dL
Hgb urine dipstick: NEGATIVE
Ketones, ur: NEGATIVE mg/dL
Leukocytes,Ua: NEGATIVE
Nitrite: NEGATIVE
Protein, ur: NEGATIVE mg/dL
Specific Gravity, Urine: 1.013 (ref 1.005–1.030)
pH: 6 (ref 5.0–8.0)

## 2019-08-01 LAB — I-STAT BETA HCG BLOOD, ED (MC, WL, AP ONLY): I-stat hCG, quantitative: <5 m[IU]/mL (ref <5)

## 2019-08-01 MED ORDER — OXYCODONE-ACETAMINOPHEN 5-325 MG PO TABS
1.0000 | ORAL_TABLET | Freq: Once | ORAL | Status: AC
  Administered 2019-08-01: 1 via ORAL
  Filled 2019-08-01: qty 1

## 2019-08-01 MED ORDER — PREDNISONE 20 MG PO TABS: 40.0000 mg | ORAL_TABLET | Freq: Every day | ORAL | 0 refills | Status: AC

## 2019-08-01 MED ORDER — LIDOCAINE 5 % EX PTCH
1.0000 | MEDICATED_PATCH | CUTANEOUS | 0 refills | Status: DC
Start: 2019-08-01 — End: 2021-09-01

## 2019-08-01 MED ORDER — KETOROLAC TROMETHAMINE 60 MG/2ML IM SOLN
60.0000 mg | Freq: Once | INTRAMUSCULAR | Status: AC
  Administered 2019-08-01: 60 mg via INTRAMUSCULAR
  Filled 2019-08-01: qty 2

## 2019-08-01 MED ORDER — METHOCARBAMOL 500 MG PO TABS
500.0000 mg | ORAL_TABLET | Freq: Two times a day (BID) | ORAL | 0 refills | Status: DC
Start: 2019-08-01 — End: 2021-09-01

## 2019-08-01 MED ORDER — METHOCARBAMOL 1000 MG/10ML IJ SOLN
1000.0000 mg | Freq: Once | INTRAMUSCULAR | Status: AC
  Administered 2019-08-01: 1000 mg via INTRAMUSCULAR
  Filled 2019-08-01 (×2): qty 10

## 2019-08-01 NOTE — ED Provider Notes (Signed)
[facility] EMERGENCY DEPARTMENT Provider Note   CSN: [id number] Arrival date & time: 08/01/19  1550     History Chief Complaint  Patient presents with  . Back Pain    Paula Villegas is a 44 y.o. female past medical history of bradycardia, BV, GERD, prediabetes who presents for evaluation of lower back pain that has been ongoing for about 2 weeks.  Patient reports that she did not have any preceding trauma or injury prior to onset of pain.  She reports that her lower back started hurting on both sides.  She went to urgent care on 07/26/2019 where she got a steroid injection in her hip.  She also reports that she was discharged with meloxicam and Zanaflex.  She reports that she stopped taking those medications because she felt like they were giving her headache.  She comes in today because she continues to have pain.  She states the pain is mostly in her lower back.  It is started radiating to her hips and into her lower abdomen.  Her pain is worse with movement. She has had some nausea but denies any vomiting.  She states occasionally the pain will go down into her right buttock and down her right lower leg.  She has still been able to ambulate.  Patient denies any fevers, dysuria, hematuria, vomiting, CP, SOB, vaginal discharge, vaginal bleeding. Denies fevers, weight loss, numbness/weakness of upper and lower extremities, bowel/bladder incontinence, saddle anesthesia, history of back surgery, history of IVDA.    The history is provided by the patient. A language interpreter was used.       Past Medical History:  Diagnosis Date  . Abnormal vaginal bleeding   . Bradycardia   . BV (bacterial vaginosis)   . GERD (gastroesophageal reflux disease)   . IUD migration   . Ovarian cyst 2014  . Prediabetes 07/2018  . Preterm labor    PTL x2, PTD x1    Patient Active Problem List   Diagnosis Date Noted  . Overweight (BMI 25.0-29.9) 12/26/2018  . Prediabetes 07/2018    . Hypothyroidism 12/14/2017  . Intractable back pain 06/10/2016  . BV (bacterial vaginosis) 06/10/2016  . Abnormal uterine bleeding 06/10/2016  . Bradycardia 06/10/2016  . Sciatica of right side   . Ovarian cyst 03/23/2012  . Appendicitis 05/22/2011  . IUD migration 03/02/2011  . ANEMIA-NOS 12/14/2008  . BILIARY COLIC 12/14/2008    Past Surgical History:  Procedure Laterality Date  . APPENDECTOMY  2014   Laparoscopic  . INTRAUTERINE DEVICE INSERTION  2015   ?copper--states good for 10 years  . LAPAROSCOPIC CHOLECYSTECTOMY  2012     OB History    Gravida  5   Para  4   Term  3   Preterm  1   AB  1   Living  4     SAB  1   TAB      Ectopic      Multiple      Live Births  4           Family History  Problem Relation Age of Onset  . Hypertension Mother   . Cancer Mother 43       cervical  . Heart disease Mother        Died today, 2017/07/11 when patient in for CPE--MI related to alcohol abuse.  . Asthma Brother   . Diabetes Brother   . Diabetes Father   . Alcohol abuse Father   .  Heart disease Father   . Seizures Brother 34       post traumatic brain injury --pedestrian hit by car  . Hearing loss Neg Hx     Social History   Tobacco Use  . Smoking status: Never Smoker  . Smokeless tobacco: Never Used  Substance Use Topics  . Alcohol use: Yes    Comment: 2020:  2 drinks monthly on average  . Drug use: No    Home Medications Prior to Admission medications   Medication Sig Start Date End Date Taking? Authorizing Provider  ibuprofen (ADVIL) 200 MG tablet Take 600 mg by mouth every 6 (six) hours as needed for moderate pain.   Yes [provider]  meloxicam (MOBIC) 7.5 MG tablet Take 1 tablet (7.5 mg total) by mouth daily. 07/26/19  Yes Wallis Bamberg, PA-C  Menthol, Topical Analgesic, (ICY HOT BACK EX) Apply 1 application topically daily as needed (for pain).   Yes [provider]  tiZANidine (ZANAFLEX) 4 MG tablet Take 1 tablet  (4 mg total) by mouth at bedtime as needed. 07/26/19  Yes Wallis Bamberg, PA-C  albuterol (VENTOLIN HFA) 108 (90 Base) MCG/ACT inhaler Inhale 2 puffs into the lungs every 6 (six) hours as needed for wheezing or shortness of breath. Patient not taking: Reported on 09/07/2018 08/15/18   Julieanne Manson, MD  cetirizine (ZYRTEC) 10 MG tablet Take 1 tablet (10 mg total) by mouth daily. Patient not taking: Reported on 08/01/2019 07/25/18   Julieanne Manson, MD  diclofenac sodium (VOLTAREN) 1 % GEL Apply 2 g topically 4 (four) times daily. Patient not taking: Reported on 08/01/2019 09/07/18   Julieanne Manson, MD  famotidine (PEPCID) 20 MG tablet 2 tabs by mouth at bedtime daily. Patient not taking: Reported on 12/26/2018 09/07/18   Julieanne Manson, MD  levothyroxine (SYNTHROID, LEVOTHROID) 50 MCG tablet 1 tab by mouth on empty stomach once daily. Patient not taking: Reported on 08/01/2019 04/05/18   Julieanne Manson, MD  lidocaine (LIDODERM) 5 % Place 1 patch onto the skin daily. Remove & Discard patch within 12 hours or as directed by MD 08/01/19   Maxwell Caul, PA-C  methocarbamol (ROBAXIN) 500 MG tablet Take 1 tablet (500 mg total) by mouth 2 (two) times daily. 08/01/19   Maxwell Caul, PA-C  predniSONE (DELTASONE) 20 MG tablet Take 2 tablets (40 mg total) by mouth daily for 4 days. 08/01/19 08/05/19  Maxwell Caul, PA-C    Allergies    Patient has no known allergies.  Review of Systems   Review of Systems  Constitutional: Negative for fever.  Respiratory: Negative for cough and shortness of breath.   Cardiovascular: Negative for chest pain.  Gastrointestinal: Positive for abdominal pain and nausea. Negative for vomiting.  Genitourinary: Negative for dysuria and hematuria.  Musculoskeletal: Positive for back pain. Negative for neck pain.  Neurological: Negative for weakness, numbness and headaches.  All other systems reviewed and are negative.   Physical Exam Updated Vital  Signs BP 98/74 (BP Location: Right Arm)   Pulse 76   Temp 98.6 F (37 C) (Oral)   Resp 19   Wt 67.1 kg   LMP 07/22/2019   SpO2 100%   BMI 27.96 kg/m   Physical Exam Vitals and nursing note reviewed.  Constitutional:      Appearance: Normal appearance. She is well-developed.  HENT:     Head: Normocephalic and atraumatic.  Eyes:     General: Lids are normal.     Conjunctiva/sclera: Conjunctivae  normal.     Pupils: Pupils are equal, round, and reactive to light.  Neck:     Comments: Full flexion/extension and lateral movement of neck fully intact. No bony midline tenderness. No deformities or crepitus.  Cardiovascular:     Rate and Rhythm: Normal rate and regular rhythm.     Pulses: Normal pulses.     Heart sounds: Normal heart sounds. No murmur. No friction rub. No gallop.   Pulmonary:     Effort: Pulmonary effort is normal.     Breath sounds: Normal breath sounds.     Comments: Lungs clear to auscultation bilaterally.  Symmetric chest rise.  No wheezing, rales, rhonchi. Abdominal:     Palpations: Abdomen is soft. Abdomen is not rigid.     Tenderness: There is abdominal tenderness in the right lower quadrant, suprapubic area and left lower quadrant. There is no guarding.     Comments: Abdomen soft, nondistended.  Mild tenderness noted to lower abdomen with no focal point.  Musculoskeletal:        General: Normal range of motion.     Cervical back: Full passive range of motion without pain.       Back:     Comments: No midline T-spine tenderness.  Diffuse tenderness palpation of the entire lower lumbar region that extends over the midline.  No deformity or crepitus noted.  Tenderness extends slightly down into the right buttock.  Mild tenderness noted over the bilateral hips.  No deformity or crepitus noted.  Flexion/tension of bilateral lower extremities intact with any difficulty.  Skin:    General: Skin is warm and dry.     Capillary Refill: Capillary refill takes less  than 2 seconds.  Neurological:     Mental Status: She is alert and oriented to person, place, and time.     Comments: Follows commands, Moves all extremities  5/5 strength to BUE and BLE  Sensation intact throughout all major nerve distributions Positive SLR.   Psychiatric:        Speech: Speech normal.     ED Results / Procedures / Treatments   Labs (all labs ordered are listed, but only abnormal results are displayed) Labs Reviewed  BASIC METABOLIC PANEL - Abnormal; Notable for the following components:      Result Value   Glucose, Bld 105 (*)    All other components within normal limits  CBC - Abnormal; Notable for the following components:   MCH 25.7 (*)    RDW 19.5 (*)    All other components within normal limits  URINALYSIS, ROUTINE W REFLEX MICROSCOPIC  I-STAT BETA HCG BLOOD, ED ([facility], [facility], [facility] ONLY)    EKG None  Radiology DG Lumbar Spine Complete  Result Date: 08/01/2019 CLINICAL DATA:  Lower back pain. EXAM: LUMBAR SPINE - COMPLETE 4+ VIEW COMPARISON:  None. FINDINGS: There is no evidence of lumbar spine fracture. Alignment is normal. Mild intervertebral disc space narrowing is seen at the level of L5-S1. Intervertebral disc spaces are maintained throughout the remainder of the lumbar spine. Radiopaque surgical clips are seen within the right upper quadrant. An IUD is in place. IMPRESSION: Mild degenerative disc disease at L5-S1. Electronically Signed   By: Aram Candelahaddeus  Houston M.D.   On: 08/01/2019 18:03   CT Renal Stone Study  Result Date: 08/01/2019 CLINICAL DATA:  Lower back and abdominal pain for 2 weeks, worsening. No known injury. EXAM: CT ABDOMEN AND PELVIS WITHOUT CONTRAST TECHNIQUE: Multidetector CT imaging of the abdomen and pelvis was  performed following the standard protocol without IV contrast. COMPARISON:  CT abdomen and pelvis 05/22/2011. FINDINGS: Lower chest: Lung bases demonstrate mild dependent atelectasis. No pleural or pericardial effusion. Hepatobiliary:  No focal liver abnormality is seen. Status post cholecystectomy. No biliary dilatation. Pancreas: Unremarkable. No pancreatic ductal dilatation or surrounding inflammatory changes. Spleen: Normal in size without focal abnormality. Adrenals/Urinary Tract: 1.2 x 1.0 cm right adrenal adenoma is unchanged since 05/22/2011. Left adrenal gland appears normal. Kidneys are normal in appearance bilaterally. No stone or hydronephrosis. Ureters and urinary bladder appear normal. Stomach/Bowel: Stomach is within normal limits. Status post appendectomy. No evidence of bowel wall thickening, distention, or inflammatory changes. Vascular/Lymphatic: No significant vascular findings are present. No enlarged abdominal or pelvic lymph nodes. Reproductive: Uterus and bilateral adnexa are unremarkable. IUD noted. Other: None. Musculoskeletal: No acute abnormality or worrisome lesion. Small bone island in L4 is unchanged. IMPRESSION: Negative for urinary tract stone. No acute abnormality abdomen or pelvis. No finding to explain the patient's symptoms. Status post cholecystectomy and appendectomy. Trying the right adrenal adenoma, unchanged. Electronically Signed   By: Inge Rise M.D.   On: 08/01/2019 20:48    Procedures Procedures (including critical care time)  Medications Ordered in ED Medications  oxyCODONE-acetaminophen (PERCOCET/ROXICET) 5-325 MG per tablet 1 tablet (1 tablet Oral Given 08/01/19 1929)  ketorolac (TORADOL) injection 60 mg (60 mg Intramuscular Given 08/01/19 1929)  methocarbamol (ROBAXIN) injection 1,000 mg (1,000 mg Intramuscular Given 08/01/19 2017)  oxyCODONE-acetaminophen (PERCOCET/ROXICET) 5-325 MG per tablet 1 tablet (1 tablet Oral Given 08/01/19 2244)    ED Course  I have reviewed the triage vital signs and the nursing notes.  Pertinent labs & imaging results that were available during my care of the patient were reviewed by me and considered in my medical decision making (see chart for  details).    MDM Rules/Calculators/A&P                      44 year old female who presents for evaluation of lower back pain x2-week.  Seen at urgent care prior and was given medications but states he continues to have pain.  No fevers, numbness/weakness, saddle anesthesia.  Does report that she feels like it is radiating to her hips and her abdomen.  On initially arrival, she is afebrile, nontoxic-appearing.  Vital signs are stable.  On exam, she has tenderness palpation in the lower back that extends into the bilateral hips.  She also has some mild tenderness palpation of the lower abdomen. Consider musculoskeletal etiology versus sciatica.  Low suspicion for kidney stone, UTI given history/physical exam.  History/physical exam not concerning for cauda equina, spinal abscess.  Doubt intra-abdominal infectious process.  Labs, x-ray of lumbar spine ordered at triage.  I-STAT beta negative.  BMP is unremarkable.  UA negative for any infectious etiology.  CBC shows no leukocytosis or anemia.  Lumbar spine shows mild degenerative disc disease at L5-S1.  This CT renal study shows no evidence of kidney stone.  No acute abnormality seen.  Using the interpreter, I discussed results with patient.  She does report some improvement in pain after analgesics.  I went through all the results with her.  At this time, suspect this is most likely due to MSK.  Her pain is worse with movement and she has some SLR that is consistent with sciatica.  Discussed with her regarding treatment options.  At this time, do not suspect pelvic etiology.  Most of her pain is in her back  and is mostly musculoskeletal in nature.  She has no vaginal discharge, vaginal bleeding.  We discussed potentially doing a pelvic exam and discussed risk first benefits.  After engaging in shared decision making.  Patient opted out for pelvic exam which I feel is reasonable given reassuring exam.  At this time, her exam is not concerning for spinal  abscess, cauda equina.  No indication that she needs emergent MRI.  We will plan to treat her with a short course of prednisone, muscle relaxers, gabapentin.  Instructed patient that she will get an outpatient referral to neurosurgery if her symptoms continue to get worse. At this time, patient exhibits no emergent life-threatening condition that require further evaluation in ED or admission. Patient had ample opportunity for questions and discussion. All patient's questions were answered with full understanding. Strict return precautions discussed. Patient expresses understanding and agreement to plan.   Portions of this note were generated with Scientist, clinical (histocompatibility and immunogenetics). Dictation errors may occur despite best attempts at proofreading.  Final Clinical Impression(s) / ED Diagnoses Final diagnoses:  Bilateral low back pain with sciatica, sciatica laterality unspecified, unspecified chronicity    Rx / DC Orders ED Discharge Orders         Ordered    predniSONE (DELTASONE) 20 MG tablet  Daily     08/01/19 2246    methocarbamol (ROBAXIN) 500 MG tablet  2 times daily     08/01/19 2246    lidocaine (LIDODERM) 5 %  Every 24 hours     08/01/19 2246           Maxwell Caul, PA-C 08/01/19 2322    Milagros Loll, MD 08/02/19 2314

## 2019-08-01 NOTE — ED Triage Notes (Signed)
Pt arrives to ED from home with complaints of lower back pain for the past two weeks. Patient states the pain continues to get worse. Patient denies urinary problems. Was seen at [facility] for same with no improvement with their treatment.

## 2019-08-01 NOTE — ED Notes (Signed)
Culture sent in addition to UA 

## 2019-08-01 NOTE — Discharge Instructions (Signed)
Take prednisone as directed.  Take Robaxin as prescribed. This medication will make you drowsy so do not drive or drink alcohol when taking it.  Use Lidoderm patches as directed.   As we discussed, if your symptoms do not improve in 3 to 4 weeks, I provided you with outpatient referral to neurosurgery can follow-up with.  Return to the Emergency Department immediately for any worsening back pain, neck pain, difficulty walking, numbness/weaknss of your arms or legs, urinary or bowel accidents, fever or any other worsening or concerning symptoms.

## 2020-02-05 ENCOUNTER — Other Ambulatory Visit: Payer: No Typology Code available for payment source

## 2020-02-05 DIAGNOSIS — Z20822 Contact with and (suspected) exposure to covid-19: Secondary | ICD-10-CM

## 2020-02-06 LAB — SARS-COV-2, NAA 2 DAY TAT

## 2020-02-06 LAB — NOVEL CORONAVIRUS, NAA: SARS-CoV-2, NAA: NOT DETECTED

## 2020-05-28 IMAGING — DX DG LUMBAR SPINE COMPLETE 4+V
5 series · 5 of 5 positions shown · non-contrast
Comparison: None.

CLINICAL DATA: Lower back pain.

EXAM:
LUMBAR SPINE - COMPLETE 4+ VIEW

[l-spine ap]
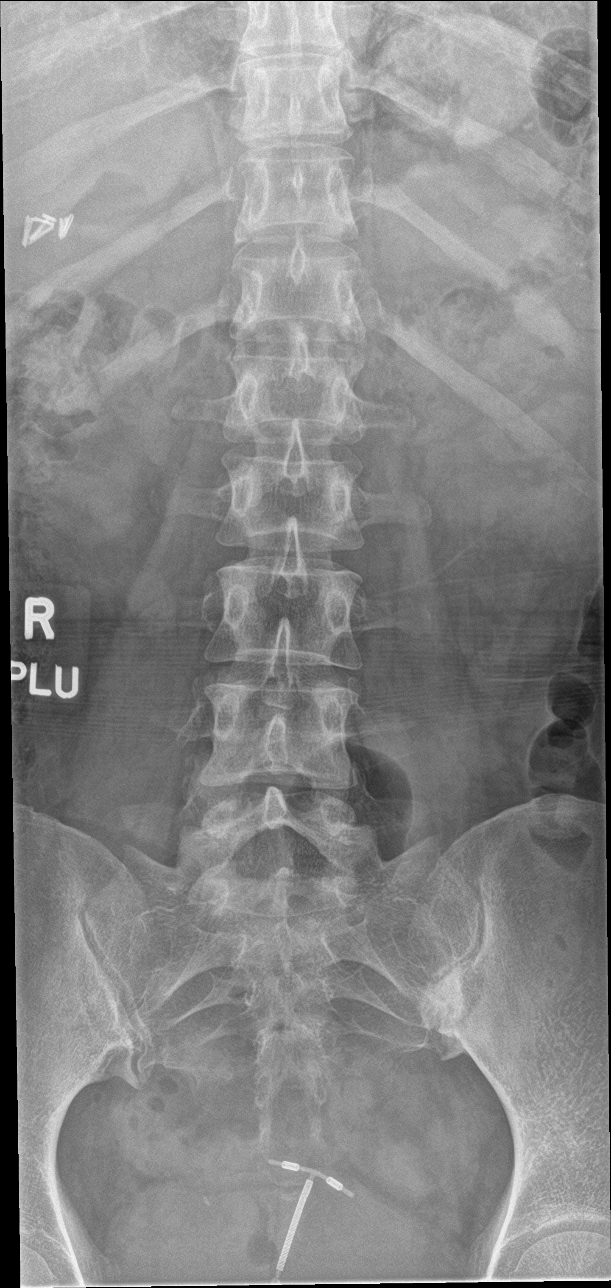

[l-spine obl (1 of 2)]
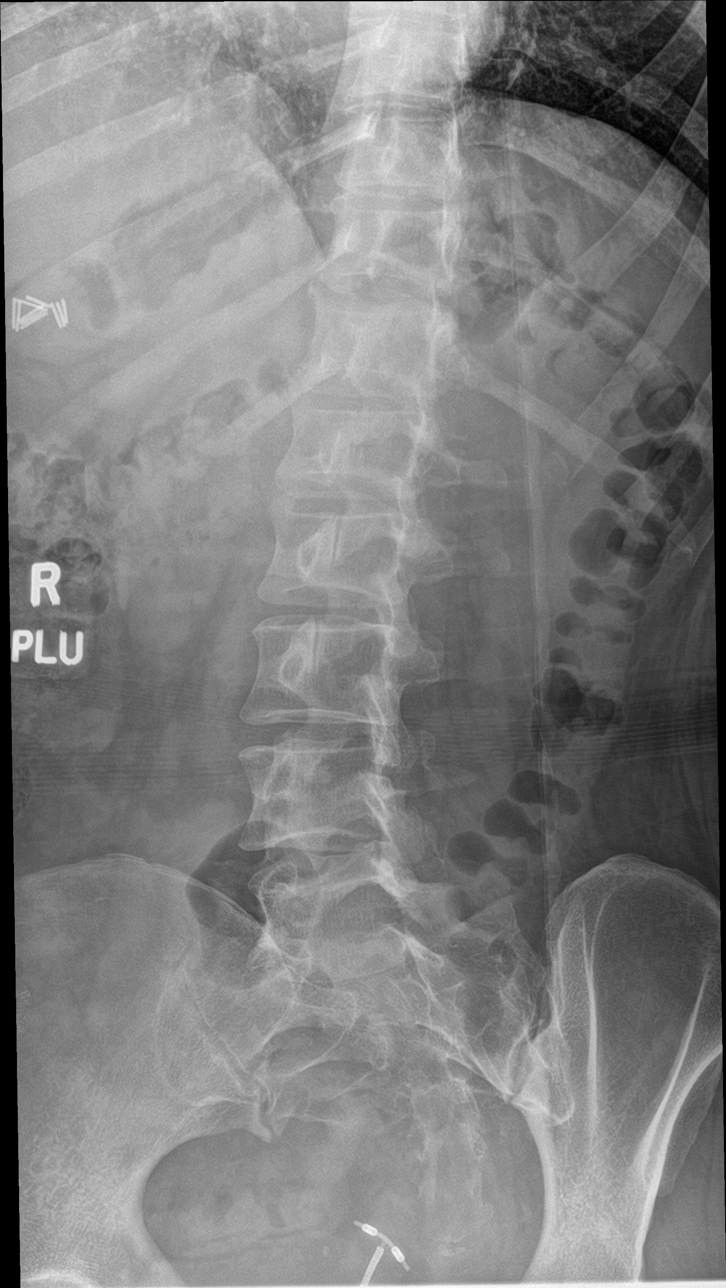

[l-spine obl (2 of 2)]
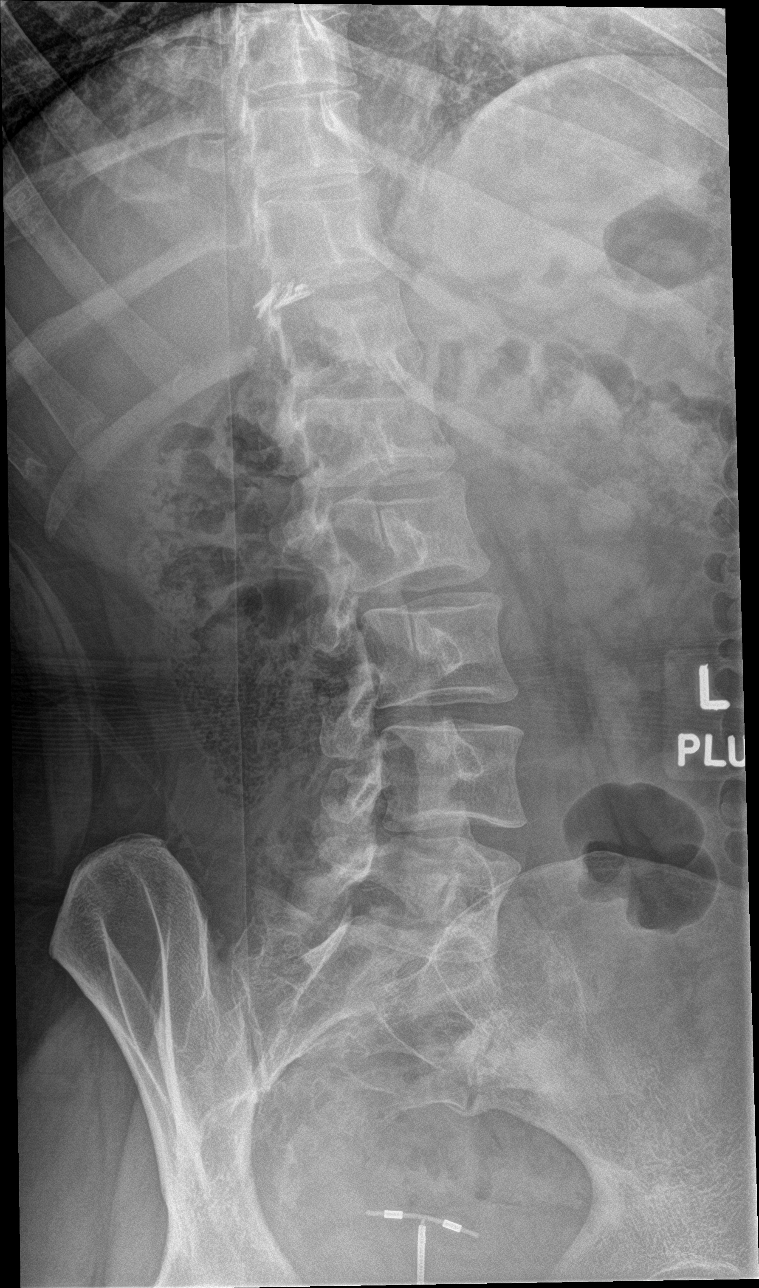

[l-spine lat]
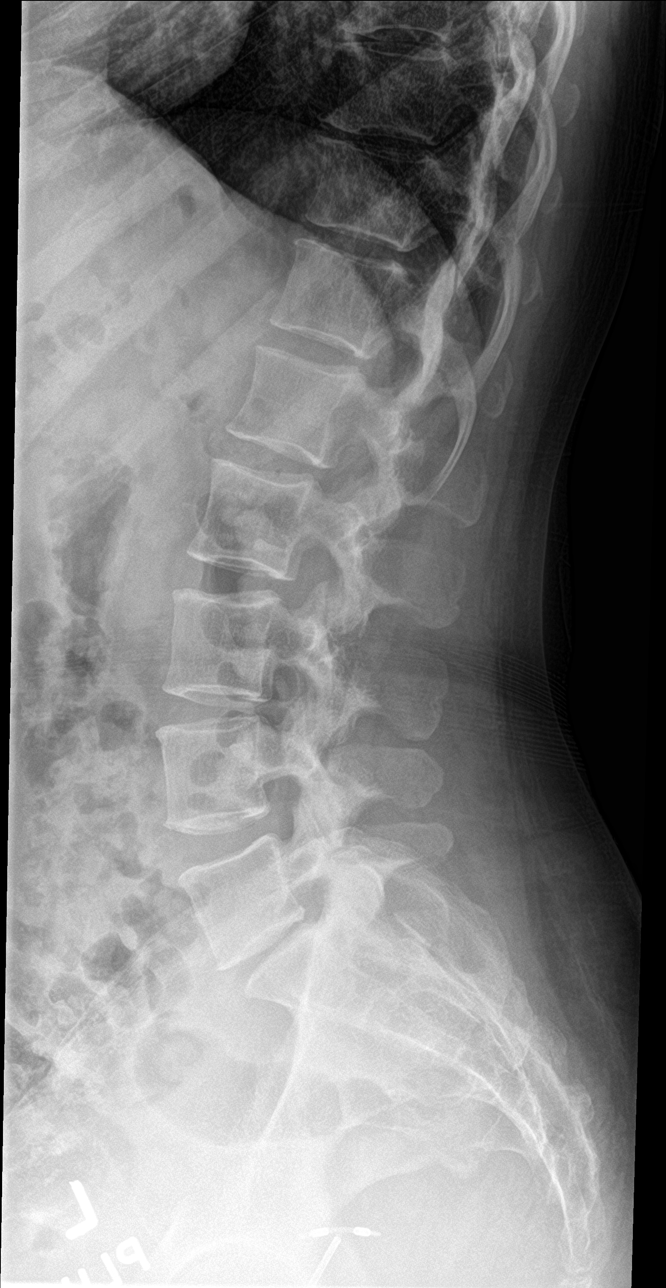

[l-spine spot]
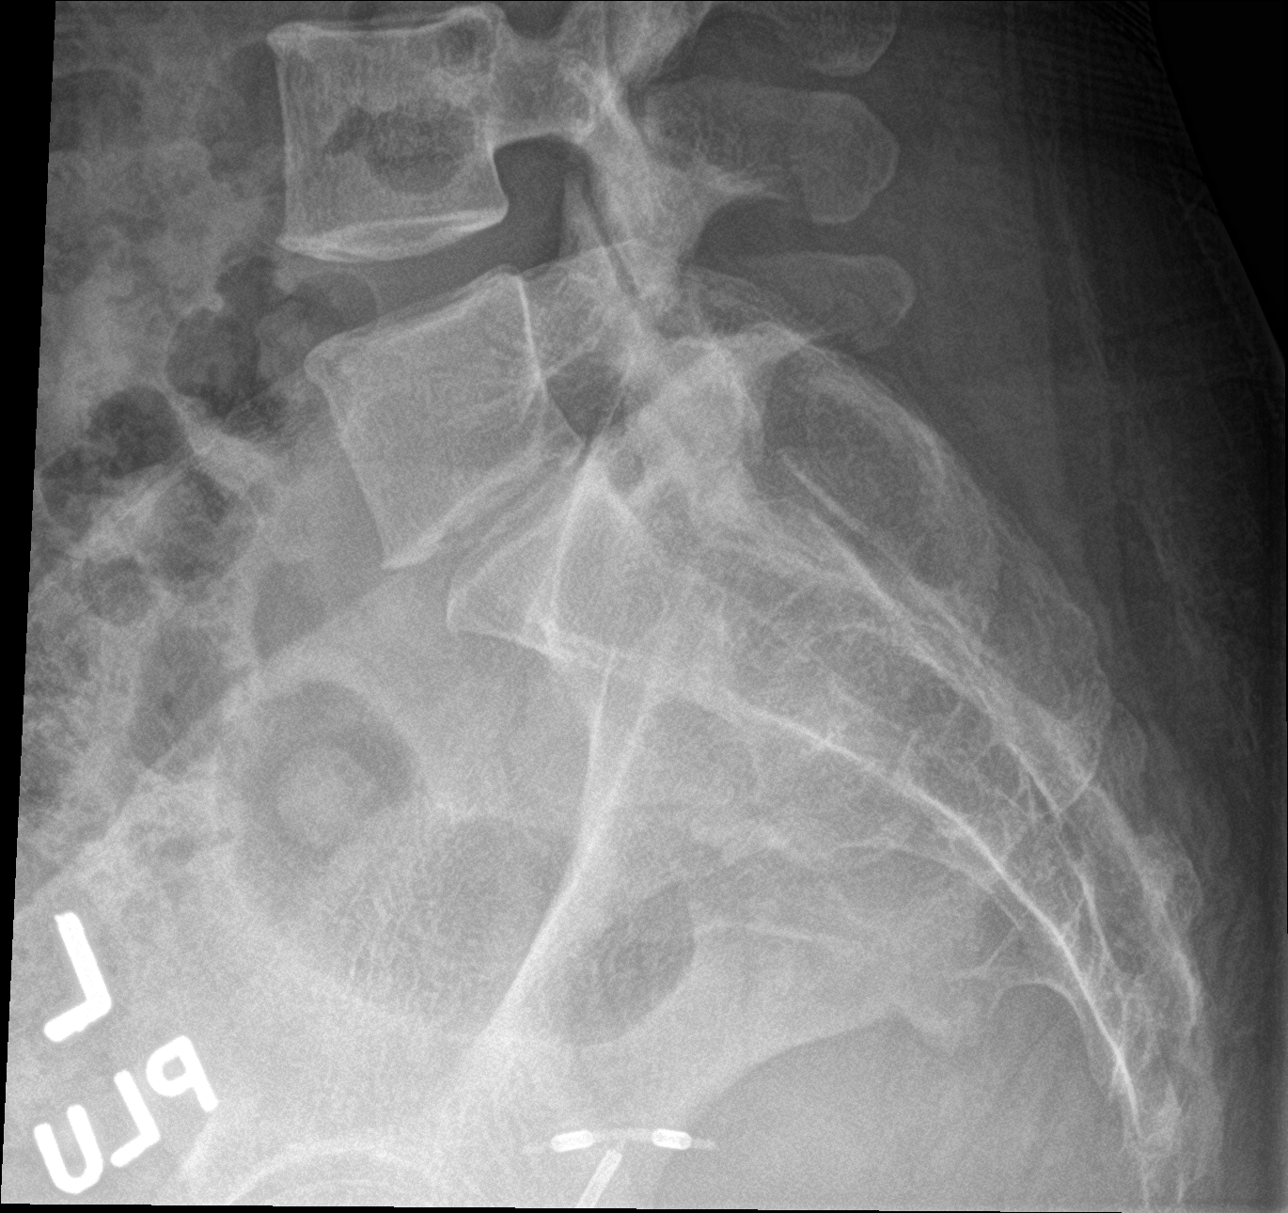

[5 of 5 positions shown; findings below may reference images not displayed]

FINDINGS: There is no evidence of lumbar spine fracture. Alignment is normal.
Mild intervertebral disc space narrowing is seen at the level of
L5-S1. Intervertebral disc spaces are maintained throughout the
remainder of the lumbar spine. Radiopaque surgical clips are seen
within the right upper quadrant. An IUD is in place.
IMPRESSION: Mild degenerative disc disease at L5-S1.

## 2021-06-11 ENCOUNTER — Ambulatory Visit: Payer: Self-pay | Admitting: Internal Medicine

## 2021-06-11 ENCOUNTER — Encounter: Payer: Self-pay | Admitting: Internal Medicine

## 2021-06-11 ENCOUNTER — Telehealth: Payer: Self-pay

## 2021-06-11 ENCOUNTER — Other Ambulatory Visit (INDEPENDENT_AMBULATORY_CARE_PROVIDER_SITE_OTHER): Payer: Self-pay

## 2021-06-11 ENCOUNTER — Other Ambulatory Visit: Payer: Self-pay

## 2021-06-11 VITALS — BP 100/60 | HR 92 | Temp 98.7°F | Resp 16 | Ht 62.5 in | Wt 146.0 lb

## 2021-06-11 DIAGNOSIS — J020 Streptococcal pharyngitis: Secondary | ICD-10-CM

## 2021-06-11 DIAGNOSIS — J029 Acute pharyngitis, unspecified: Secondary | ICD-10-CM

## 2021-06-11 LAB — POCT INFLUENZA A/B
Influenza A, POC: NEGATIVE
Influenza B, POC: NEGATIVE

## 2021-06-11 LAB — POC COVID19 BINAXNOW: SARS Coronavirus 2 Ag: NEGATIVE

## 2021-06-11 MED ORDER — AMOXICILLIN 500 MG PO CAPS: ORAL_CAPSULE | ORAL | 0 refills | Status: DC

## 2021-06-11 NOTE — Telephone Encounter (Signed)
Patient would like recommendations after experiencing a sore throat since 06/10/21. Patient describes struggle to swallow saliva, pain behind right ear, chills and headache. Patient has taken Theraflu and allergy pill which has not helped. First time with this issue.  ?

## 2021-06-11 NOTE — Progress Notes (Signed)
? ? ?  Subjective:  ?  ?Patient ID: Paula Villegas, female   DOB: 1976-01-01, 46 y.o.   MRN: [id number] ? ? ?HPI ? ?Has not been here since 12/2018. ? ?Yesterday started with throat pain and difficulty swallowing.  Hard even to swallow liquids.  She has 4 children.  Her youngest has a cold, but denies sore throat.  She does work in [employer].  No one else there is ill as far as she knows.   ?No fever.  + HA today and posterior neck pain as well.  No stomache.   ?No cough, runny nose.  No ear pain. ?She did take her cetirizine yesterday.  Took Theraflu yesterday in the morning.  Neither helped.    ?Current Meds  ?Medication Sig  ? cetirizine (ZYRTEC) 10 MG tablet Take 1 tablet (10 mg total) by mouth daily.  ? ?No Known Allergies ? ? ?Review of Systems ? ? ? ?Objective:  ? ?BP 100/60 (BP Location: Right Arm, Patient Position: Sitting, Cuff Size: Normal)   Pulse 92   Resp 16   Ht 5' 2.5" (1.588 m)   Wt 146 lb (66.2 kg)   LMP 05/21/2021 (Within Days)   BMI 26.28 kg/m?  T 98.7 oral ? ?Physical Exam ?NAD ?HEENT:  PERRL, EOMI, conjunctivae without injection .  TMs pearly gray.  Posterior pharynx and tonsillar area with swelling, erythema and white exudate.   ?Neck:  Supple, No obvious adenopathy on palpation, but tender, particularly left anterior cervical area. ?Chest:  CTA ?CV:  RRR without murmur or rub.  Radial and DP pulses normal and equal ?Abd:  Mild epigastric tenderness.  No rebound or peritoneal signs.  + BS, No HSM or mass. ? ? ?Assessment & Plan  ? ?Exudative pharyngitis  Rapid Strep returned positive ?Recommend Ibuprofen 600-800 mg alternating with Tylenol 650 mg every 3 hours for throat pain.   ?Amoxicillin 500 mg twice daily for 10 days.  To call if symptoms of yeast infection. ?

## 2021-06-11 NOTE — Patient Instructions (Signed)
Tylenol 650 mg alternating with Ibuprofen 400 to 800 mg every 3 hours ?Drink cold beverages, ice, popsicles, jello, ice cream, et. ?

## 2021-06-13 NOTE — Telephone Encounter (Signed)
Patient seen by Dr Mulberry  

## 2021-07-03 ENCOUNTER — Telehealth: Payer: Self-pay

## 2021-07-03 NOTE — Telephone Encounter (Signed)
Patient would like appointment for left arm pain that she has had for 2 weeks. Patient woke up with the arm pain. Pain to touch and move. Has noticed some swelling on arm. Pain is constant. Pain has stayed the same. Has happened in the past, but pain went away on its own. Has taken ibuprofen and used Diclofenac gel, but has not helped any. ?

## 2021-07-18 NOTE — Telephone Encounter (Signed)
Patient has been scheduled

## 2021-07-24 ENCOUNTER — Encounter: Payer: Self-pay | Admitting: Internal Medicine

## 2021-07-24 ENCOUNTER — Ambulatory Visit: Payer: Self-pay | Admitting: Internal Medicine

## 2021-07-24 VITALS — BP 100/60 | HR 64 | Resp 12 | Ht 62.5 in | Wt 149.0 lb

## 2021-07-24 DIAGNOSIS — R5383 Other fatigue: Secondary | ICD-10-CM

## 2021-07-24 DIAGNOSIS — J302 Other seasonal allergic rhinitis: Secondary | ICD-10-CM

## 2021-07-24 DIAGNOSIS — N920 Excessive and frequent menstruation with regular cycle: Secondary | ICD-10-CM

## 2021-07-24 DIAGNOSIS — Z23 Encounter for immunization: Secondary | ICD-10-CM

## 2021-07-24 DIAGNOSIS — G8929 Other chronic pain: Secondary | ICD-10-CM | POA: Insufficient documentation

## 2021-07-24 DIAGNOSIS — E049 Nontoxic goiter, unspecified: Secondary | ICD-10-CM | POA: Insufficient documentation

## 2021-07-24 LAB — POCT RAPID STREP A (OFFICE): Rapid Strep A Screen: POSITIVE — AB

## 2021-07-24 MED ORDER — LORATADINE 10 MG PO TABS: 10.0000 mg | ORAL_TABLET | Freq: Every day | ORAL | 11 refills | Status: DC

## 2021-07-24 MED ORDER — FERROUS GLUCONATE 324 (38 FE) MG PO TABS: 324.0000 mg | ORAL_TABLET | Freq: Every day | ORAL | 3 refills | Status: DC

## 2021-07-24 MED ORDER — KETOTIFEN FUMARATE 0.025 % OP SOLN: 1.0000 [drp] | Freq: Two times a day (BID) | OPHTHALMIC | 0 refills | Status: DC
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
## 2021-07-25 LAB — COMPREHENSIVE METABOLIC PANEL
ALT: 15 IU/L (ref 0–32)
AST: 19 IU/L (ref 0–40)
Albumin/Globulin Ratio: 1.7 (ref 1.2–2.2)
Albumin: 4.3 g/dL (ref 3.8–4.8)
Alkaline Phosphatase: 74 IU/L (ref 44–121)
BUN/Creatinine Ratio: 12 (ref 9–23)
BUN: 9 mg/dL (ref 6–24)
Bilirubin Total: 0.3 mg/dL (ref 0.0–1.2)
CO2: 20 mmol/L (ref 20–29)
Calcium: 8.5 mg/dL — ABNORMAL LOW (ref 8.7–10.2)
Chloride: 105 mmol/L (ref 96–106)
Creatinine, Ser: 0.78 mg/dL (ref 0.57–1.00)
Globulin, Total: 2.6 g/dL (ref 1.5–4.5)
Sodium: 140 mmol/L (ref 134–144)
Total Protein: 6.9 g/dL (ref 6.0–8.5)
eGFR: 94 mL/min/{1.73_m2} (ref >59)

## 2021-07-25 LAB — CBC WITH DIFFERENTIAL/PLATELET
Basophils Absolute: 0 10*3/uL (ref 0.0–0.2)
Basos: 1 %
EOS (ABSOLUTE): 0.1 10*3/uL (ref 0.0–0.4)
Eos: 1 %
Hematocrit: 30.7 % — ABNORMAL LOW (ref 34.0–46.6)
Hemoglobin: 9 g/dL — ABNORMAL LOW (ref 11.1–15.9)
Immature Grans (Abs): 0 10*3/uL (ref 0.0–0.1)
Immature Granulocytes: 0 %
Lymphocytes Absolute: 2.2 10*3/uL (ref 0.7–3.1)
Lymphs: 38 %
MCH: 20.5 pg — ABNORMAL LOW (ref 26.6–33.0)
MCHC: 29.3 g/dL — ABNORMAL LOW (ref 31.5–35.7)
MCV: 70 fL — ABNORMAL LOW (ref 79–97)
Monocytes Absolute: 0.5 10*3/uL (ref 0.1–0.9)
Monocytes: 8 %
Neutrophils Absolute: 3 10*3/uL (ref 1.4–7.0)
Neutrophils: 52 %
Platelets: 401 10*3/uL (ref 150–450)
RBC: 4.4 x10E6/uL (ref 3.77–5.28)
RDW: 17.3 % — ABNORMAL HIGH (ref 11.7–15.4)
WBC: 5.8 10*3/uL (ref 3.4–10.8)

## 2021-07-25 LAB — TSH: TSH: 5.67 u[IU]/mL — ABNORMAL HIGH (ref 0.450–4.500)

## 2021-08-05 ENCOUNTER — Other Ambulatory Visit: Payer: Self-pay

## 2021-08-05 MED ORDER — FERROUS GLUCONATE 324 (38 FE) MG PO TABS: ORAL_TABLET | ORAL | 3 refills | Status: DC

## 2021-08-07 ENCOUNTER — Other Ambulatory Visit: Payer: Self-pay

## 2021-08-07 DIAGNOSIS — Z1211 Encounter for screening for malignant neoplasm of colon: Secondary | ICD-10-CM

## 2021-08-07 LAB — POC FIT TEST STOOL: Fecal Occult Blood: NEGATIVE

## 2021-08-12 ENCOUNTER — Ambulatory Visit (INDEPENDENT_AMBULATORY_CARE_PROVIDER_SITE_OTHER): Payer: Self-pay

## 2021-08-12 ENCOUNTER — Ambulatory Visit (INDEPENDENT_AMBULATORY_CARE_PROVIDER_SITE_OTHER): Payer: Self-pay | Admitting: Orthopaedic Surgery

## 2021-08-12 ENCOUNTER — Encounter: Payer: Self-pay | Admitting: Orthopaedic Surgery

## 2021-08-12 DIAGNOSIS — G8929 Other chronic pain: Secondary | ICD-10-CM

## 2021-08-12 DIAGNOSIS — M79645 Pain in left finger(s): Secondary | ICD-10-CM

## 2021-08-12 DIAGNOSIS — M25512 Pain in left shoulder: Secondary | ICD-10-CM

## 2021-08-12 NOTE — Addendum Note (Signed)
Addended by: Barbette Or on: 08/12/2021 11:33 AM   Modules accepted: Orders
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
## 2021-08-29 ENCOUNTER — Ambulatory Visit: Payer: Self-pay | Admitting: Internal Medicine

## 2021-08-29 ENCOUNTER — Telehealth: Payer: Self-pay

## 2021-08-29 NOTE — Telephone Encounter (Signed)
Patient would like an appointment after experiencing congestions, dry cough, and difficulty breathing for about 4 weeks. Patient describes symptoms to be similar to her seasonal allergy symptoms, but loratadine does not help any. Symptoms are reoccurring and lasts days at time. Feels better for a few days before symptoms return

## 2021-09-01 ENCOUNTER — Encounter: Payer: Self-pay | Admitting: Internal Medicine

## 2021-09-01 ENCOUNTER — Ambulatory Visit: Payer: Self-pay | Admitting: Internal Medicine

## 2021-09-01 VITALS — BP 112/68 | HR 72 | Resp 12 | Ht 62.5 in | Wt 150.0 lb

## 2021-09-01 DIAGNOSIS — R052 Subacute cough: Secondary | ICD-10-CM

## 2021-09-01 DIAGNOSIS — R7989 Other specified abnormal findings of blood chemistry: Secondary | ICD-10-CM

## 2021-09-01 DIAGNOSIS — D649 Anemia, unspecified: Secondary | ICD-10-CM

## 2021-09-01 DIAGNOSIS — Z201 Contact with and (suspected) exposure to tuberculosis: Secondary | ICD-10-CM

## 2021-09-01 MED ORDER — PREDNISONE 10 MG PO TABS: ORAL_TABLET | ORAL | 0 refills | Status: DC

## 2021-09-01 NOTE — Patient Instructions (Signed)
Prednisone 10 mg:  °Day 1-4:  4 tabs by mouth daily °Day 5:  3 1/2 tabs °Day 6:  3 tabs °Day 7:  2 1/2 tabs °Day 8:  2 tabs °Day 9:  1 1/2 tabs °Day 10: 1 tab °Day 11:  1/2 tab °Day 12:  done  °

## 2021-09-01 NOTE — Progress Notes (Signed)
    Subjective:    Patient ID: Paula Villegas, female   DOB: January 26, 1976, 46 y.o.   MRN: [id number]   HPI   Cough for 1 month.  Describes dry cough--if laughs or talks, if takes a deep breath.  Awakens with the cough and stays with her all day.  Has tried all sorts of OTC meds.  Tried Loratadine 10 mg with minimal improvement.  A bit better one day when she took Loratadine twice that day.  Having upper chest pain with cough.   Minimal itching of eyes. She did use the Zaditor eye drops, which helped with the eye symptoms, but not needing very often.   + sneezing at times.  No stuffy or runny nose.   Throat is itching and burning.  No drainage.  No heartburn or awakening with drainage in throat.   Did have sense of fever about 1 week ago.  May have sense of dyspnea in chest.    2.  Anemia:   Does at times have heavy periods--last was 2 weeks ago.  Her FIT was negative.  She has been taking iron twice daily for about 1 month as well.    3.  Woman who takes care of her daughter with a mother who lives in the home with what sounds like + TB testing.  Patient with history of BCG as a child.    Current Meds  Medication Sig   diclofenac sodium (VOLTAREN) 1 % GEL Apply 2 g topically 4 (four) times daily.   ferrous gluconate (FERGON) 324 MG tablet Take 1 tab by mouth twice daily   ibuprofen (ADVIL) 200 MG tablet Take 600 mg by mouth every 6 (six) hours as needed for moderate pain.   loratadine (CLARITIN) 10 MG tablet Take 1 tablet (10 mg total) by mouth daily.   Pseudoeph-Bromphen-DM (ROBITUSSIN ALLERGY/COUGH PO) Take by mouth.   No Known Allergies   Review of Systems    Objective:   BP 112/68 (BP Location: Right Arm, Patient Position: Sitting, Cuff Size: Normal)   Pulse 72   Resp 12   Ht 5' 2.5" (1.588 m)   Wt 150 lb (68 kg)   LMP 08/18/2021 (Within Days)   BMI 27.00 kg/m   Physical Exam NAD Begins dry coughing mildly with deep breathing HEENT:  PERRL, EOMI, conjunctivae  without injection.  NT over sinuses.  TMs pearly gray, throat without injection or cobbling.   Neck:  Supple, No adenopathy. Chest:  CTA with good air movement CV:  RRR without murmur or rub.  Radial and DP pulses normal and equal. LE:  No edema.   Assessment & Plan    Dry cough:  suspect airway irritation and perhaps some residual seasonal allergies:  prednisone 40 mg daily for 4 days, then taper by 5 mg daily until off.  With history of possible exposure to TB or latent TB, check quantiferon.  Call if no improvement  2.  Anemia with history of intermittent heavy periods:  CBC.  FIT was negative recently.  3.  Elevated TSH (this is a recurrent issue).  Repeat TSH and add free T4.

## 2021-09-04 LAB — CBC WITH DIFFERENTIAL/PLATELET
Basophils Absolute: 0.1 10*3/uL (ref 0.0–0.2)
Basos: 1 %
EOS (ABSOLUTE): 0.1 10*3/uL (ref 0.0–0.4)
Eos: 1 %
Hematocrit: 40.5 % (ref 34.0–46.6)
Hemoglobin: 13 g/dL (ref 11.1–15.9)
Immature Grans (Abs): 0 10*3/uL (ref 0.0–0.1)
Immature Granulocytes: 1 %
Lymphocytes Absolute: 2.7 10*3/uL (ref 0.7–3.1)
Lymphs: 40 %
MCH: 25.1 pg — ABNORMAL LOW (ref 26.6–33.0)
MCHC: 32.1 g/dL (ref 31.5–35.7)
MCV: 78 fL — ABNORMAL LOW (ref 79–97)
Monocytes Absolute: 0.5 10*3/uL (ref 0.1–0.9)
Monocytes: 7 %
Neutrophils Absolute: 3.4 10*3/uL (ref 1.4–7.0)
Neutrophils: 50 %
Platelets: 348 10*3/uL (ref 150–450)
RBC: 5.18 x10E6/uL (ref 3.77–5.28)
RDW: 26.8 % — ABNORMAL HIGH (ref 11.7–15.4)
WBC: 6.7 10*3/uL (ref 3.4–10.8)

## 2021-09-04 LAB — QUANTIFERON-TB GOLD PLUS
QuantiFERON Mitogen Value: >10 IU/mL
QuantiFERON Nil Value: 0.08 IU/mL
QuantiFERON TB1 Ag Value: 0.2 IU/mL
QuantiFERON TB2 Ag Value: 0.19 IU/mL
QuantiFERON-TB Gold Plus: NEGATIVE

## 2021-09-04 LAB — TSH: TSH: 5.99 u[IU]/mL — ABNORMAL HIGH (ref 0.450–4.500)

## 2021-09-04 LAB — T4, FREE: Free T4: 0.93 ng/dL (ref 0.82–1.77)

## 2021-09-05 ENCOUNTER — Other Ambulatory Visit: Payer: Self-pay

## 2021-09-08 NOTE — Telephone Encounter (Signed)
Patient was seen by Dr Mulberry °

## 2021-09-10 ENCOUNTER — Encounter: Payer: Self-pay | Admitting: Physical Medicine and Rehabilitation

## 2021-09-10 ENCOUNTER — Ambulatory Visit: Payer: Self-pay

## 2021-09-10 ENCOUNTER — Ambulatory Visit: Payer: Self-pay | Admitting: Physical Medicine and Rehabilitation

## 2021-09-10 DIAGNOSIS — M25511 Pain in right shoulder: Secondary | ICD-10-CM

## 2021-09-10 DIAGNOSIS — G8929 Other chronic pain: Secondary | ICD-10-CM

## 2021-09-10 NOTE — Progress Notes (Unsigned)
Pt state left shoulder pain. Pt state lifting makes the pain worse. Pt state she uses pain cream and takes over the counter pain meds to help ease her pain.  Numeric Pain Rating Scale and Functional Assessment Average Pain 2   In the last MONTH (on 0-10 scale) has pain interfered with the following?  1. General activity like being  able to carry out your everyday physical activities such as walking, climbing stairs, carrying groceries, or moving a chair?  Rating(8)   -BT, -Dye Allergies.

## 2021-09-10 NOTE — Progress Notes (Incomplete)
   Paula Villegas - 46 y.o. female MRN [id number]  Date of birth: [birth year]  Office Visit Note: Visit Date: 09/10/2021 PCP: Julieanne Manson, MD Referred by: Julieanne Manson, MD  Subjective: Chief Complaint  Patient presents with  . Right Shoulder - Pain   HPI:  Paula Villegas is a 46 y.o. female who comes in todayHPI ROS Otherwise per HPI.  Assessment & Plan: Visit Diagnoses: No diagnosis found.  Plan: No additional findings.   Meds & Orders: No orders of the defined types were placed in this encounter.  No orders of the defined types were placed in this encounter.   Follow-up: No follow-ups on file.   Procedures: Large Joint Inj: R glenohumeral on 09/10/2021 2:26 PM Indications: pain and diagnostic evaluation Details: 22 G 3.5 in needle, fluoroscopy-guided anteromedial approach  Arthrogram: No  Medications: 40 mg triamcinolone acetonide 40 MG/ML; 5 mL bupivacaine 0.25 % Outcome: tolerated well, no immediate complications  There was excellent flow of contrast producing a partial arthrogram of the glenohumeral joint. The patient did have relief of symptoms during the anesthetic phase of the injection. Procedure, treatment alternatives, risks and benefits explained, specific risks discussed. Consent was given by the patient. Immediately prior to procedure a time out was called to verify the correct patient, procedure, equipment, support staff and site/side marked as required. Patient was prepped and draped in the usual sterile fashion.         Clinical History: No specialty comments available.     Objective:  VS:  HT:    WT:   BMI:     BP:   HR: bpm  TEMP: ( )  RESP:  Physical Exam   Imaging: No results found.

## 2021-09-15 MED ORDER — TRIAMCINOLONE ACETONIDE 40 MG/ML IJ SUSP
40.0000 mg | INTRAMUSCULAR | Status: AC | PRN
  Administered 2021-09-10: 40 mg via INTRA_ARTICULAR

## 2021-09-15 MED ORDER — BUPIVACAINE HCL 0.25 % IJ SOLN
5.0000 mL | INTRAMUSCULAR | Status: AC | PRN
  Administered 2021-09-10: 5 mL via INTRA_ARTICULAR

## 2021-09-25 ENCOUNTER — Ambulatory Visit: Payer: Self-pay | Admitting: Orthopaedic Surgery

## 2021-10-20 ENCOUNTER — Encounter: Payer: Self-pay | Admitting: Internal Medicine

## 2021-10-20 ENCOUNTER — Ambulatory Visit: Payer: Self-pay | Admitting: Internal Medicine

## 2021-10-20 VITALS — BP 122/70 | HR 60 | Resp 12 | Ht 62.5 in | Wt 151.0 lb

## 2021-10-20 DIAGNOSIS — R7989 Other specified abnormal findings of blood chemistry: Secondary | ICD-10-CM

## 2021-10-20 DIAGNOSIS — N920 Excessive and frequent menstruation with regular cycle: Secondary | ICD-10-CM

## 2021-10-20 DIAGNOSIS — R052 Subacute cough: Secondary | ICD-10-CM

## 2021-10-20 DIAGNOSIS — J3081 Allergic rhinitis due to animal (cat) (dog) hair and dander: Secondary | ICD-10-CM

## 2021-10-20 MED ORDER — MEDROXYPROGESTERONE ACETATE 10 MG PO TABS: ORAL_TABLET | ORAL | 0 refills | Status: DC

## 2021-10-20 NOTE — Progress Notes (Signed)
    Subjective:    Patient ID: Paula Villegas, female   DOB: 03/20/1976, 46 y.o.   MRN: [id number]   HPI   Cough:  resolved with prednisone burst and taper.  Took about a week after finished the medication.  Has noted when she is near their new dog, she starts coughing.  They are looking to rehome the dog.  Discussed TB testing and rest of labs were fine at last visit.  2.   Anemia:  hemoglobin now in good normal range at 13.0, but still with some microcytosis.  Has had issues with prolonged periods for past 6 months.  Last one started on June 17th and continues to have a period.  Was really heavy at first and then spotting, then heavy again followed by spotting until went without flow at all 24-26th of July, then back to heavy flow until now.  Having a lot of cramping.  She thinks she was told she has fibroids in the ED with heavy periods in past.  I am unable to find this record--only the IUD issues in 2012 for which she went to ED.  No fibroids with this study or her OB US from what I can see. She is having hot flashes and mild night sweats.  She is still having monthly periods save for when extended as this last one has been.        Current Meds  Medication Sig   diclofenac sodium (VOLTAREN) 1 % GEL Apply 2 g topically 4 (four) times daily.   ferrous gluconate (FERGON) 324 MG tablet Take 1 tab by mouth twice daily   ibuprofen (ADVIL) 200 MG tablet Take 600 mg by mouth every 6 (six) hours as needed for moderate pain.   loratadine (CLARITIN) 10 MG tablet Take 1 tablet (10 mg total) by mouth daily.   Pseudoeph-Bromphen-DM (ROBITUSSIN ALLERGY/COUGH PO) Take by mouth.   No Known Allergies   Review of Systems    Objective:   BP 122/70 (BP Location: Right Arm, Patient Position: Sitting, Cuff Size: Normal)   Pulse 60   Resp 12   Ht 5' 2.5" (1.588 m)   Wt 151 lb (68.5 kg)   LMP 09/05/2021 (Within Days)   BMI 27.18 kg/m   Physical Exam NAD Lungs:  CTA CV  RRR without  murmur or rub.  Radial pulses normal and equal Abd:  S, NT, No HSM or mass, + BS LE:  No edema.   Assessment & Plan   Cough:  sounds like this is related really just to their new dog.  She is staying away from the dog at this point and they are looking to find it a new home.  2.  Recurrent menorrhagia:  Medroxyprogesterone 10 mg daily for 10 days to stop flow.  Discussed may occur again, hopefully short lived, after completes the med.  Pelvic ultrasound to be certain no concerning cause.  May be part of menopause for her.  Continue Iron.  3.  Elevated TSH, but FT4 normal.  Recheck in 6 months with CPE.

## 2021-10-24 ENCOUNTER — Ambulatory Visit: Payer: Self-pay | Admitting: Internal Medicine

## 2021-11-19 ENCOUNTER — Other Ambulatory Visit: Payer: Self-pay | Admitting: Internal Medicine

## 2021-12-04 ENCOUNTER — Telehealth: Payer: Self-pay

## 2021-12-04 NOTE — Telephone Encounter (Signed)
Patient called and would like to receive medroxyPROGESTERone. Patient has had period for 2 weeks now. That medication has helped in the past. Patient has not yet been scheduled for the ultra sound to investigate cause

## 2021-12-05 MED ORDER — MEDROXYPROGESTERONE ACETATE 10 MG PO TABS: ORAL_TABLET | ORAL | 0 refills | Status: DC

## 2021-12-23 ENCOUNTER — Telehealth: Payer: Self-pay

## 2021-12-23 NOTE — Telephone Encounter (Signed)
After reporting to Dr Amil Amen, patient was asked to make an appointment with public health department to remove IUD

## 2021-12-23 NOTE — Telephone Encounter (Signed)
Patient called to report that she continues to have problems with her period. Period stopped for 10 days while taking medroxyprogesterone, but period started back after she finished the medication. Period restarted since 12/15/21 and has not stopped. Patient has had the pelvis US done.

## 2022-01-15 ENCOUNTER — Other Ambulatory Visit: Payer: Self-pay

## 2022-01-15 DIAGNOSIS — N644 Mastodynia: Secondary | ICD-10-CM

## 2022-02-24 ENCOUNTER — Ambulatory Visit: Payer: Self-pay | Admitting: *Deleted

## 2022-02-24 VITALS — BP 111/53 | Wt 155.0 lb

## 2022-02-24 DIAGNOSIS — N644 Mastodynia: Secondary | ICD-10-CM

## 2022-02-24 DIAGNOSIS — Z1239 Encounter for other screening for malignant neoplasm of breast: Secondary | ICD-10-CM

## 2022-02-24 NOTE — Patient Instructions (Signed)
Explained breast self awareness with Thurnell Garbe. Patient did not need a Pap smear today due to last Pap smear was a couple weeks ago per patient. Let her know BCCCP will cover Pap smears every 3 years unless has a history of abnormal Pap smears. Referred patient to the [facility] for a diagnostic mammogram. Appointment scheduled Tuesday, March 03, 2022 at 0930. Patient aware of appointment and will be there. Paula Villegas verbalized understanding.  Bransen Fassnacht, Kathaleen Maser, RN 9:27 AM

## 2022-02-24 NOTE — Progress Notes (Signed)
Ms. Paula Villegas is a 46 y.o. female who presents to [facility] clinic today with complaint of bilateral outer breast pain x one year that comes and goes. Patient rates the pain at a 4 out of 10.    Pap Smear: Pap smear not completed today. Last Pap smear was 3-4 months ago at the [facility] clinic and was normal with a positive bacterial infection per patient. Patient stated she was given antibiotics and had a repeat Pap smear 2 weeks ago that was negative. Per patient has no history of an abnormal Pap smear. Last Pap smear result is not available in Epic. Will confirm Pap smear result with the [facility]   Physical exam: Breasts Breasts symmetrical. No skin abnormalities bilateral breasts. No nipple retraction bilateral breasts. No nipple discharge bilateral breasts. No lymphadenopathy. No lumps palpated bilateral breasts. Complaints of bilateral outer breast pain on exam.  MS DIGITAL SCREENING TOMO BILATERAL  Result Date: 08/11/2017 CLINICAL DATA:  Screening. EXAM: DIGITAL SCREENING BILATERAL MAMMOGRAM WITH TOMO AND CAD COMPARISON:  Previous exam(s). ACR Breast Density Category b: There are scattered areas of fibroglandular density. FINDINGS: There are no findings suspicious for malignancy. Images were processed with CAD. IMPRESSION: No mammographic evidence of malignancy. A result letter of this screening mammogram will be mailed directly to the patient. RECOMMENDATION: Screening mammogram in one year. (Code:SM-B-01Y) BI-RADS CATEGORY  1: Negative. Electronically Signed   By: Beckie Salts M.D.   On: 08/11/2017 17:27         Pelvic/Bimanual Pap is not indicated today per BCCCP guidelines.   Smoking History: Patient has never smoked.  Patient Navigation: Patient education provided. Access to services provided for patient through [place] program. Spanish interpreter Paula Villegas from [facility] provided.   Colorectal Cancer Screening: Per patient has  never had colonoscopy completed. Patient stated she completed a FIT Test 6 months ago given by her PCP that was negative. No complaints today.    Breast and Cervical Cancer Risk Assessment: Patient does not have family history of breast cancer, known genetic mutations, or radiation treatment to the chest before age 84. Patient does not have history of cervical dysplasia, immunocompromised, or DES exposure in-utero.  Risk Scores as of 02/24/2022     Paula Villegas           5-year 0.57 %   Lifetime 6.23 %   This patient is [place] but has no documented birth country, so the [place] model used data from [place] patients to calculate their risk score. Document a birth country in the Demographics activity for a more accurate score.         Last calculated by Paula Villegas, CMA on 02/24/2022 at  9:14 AM        A: BCCCP exam without pap smear Complaint of bilateral outer breast pain.  P: Referred patient to the [facility] for a diagnostic mammogram. Appointment scheduled Tuesday, March 03, 2022 at 0930.  Priscille Heidelberg, RN 02/24/2022 9:27 AM

## 2022-03-03 ENCOUNTER — Ambulatory Visit: Payer: Self-pay

## 2022-03-03 ENCOUNTER — Ambulatory Visit
Admission: RE | Admit: 2022-03-03 | Discharge: 2022-03-03 | Disposition: A | Discharge status: Home / Self Care | Payer: No Typology Code available for payment source | Source: Ambulatory Visit | Attending: Obstetrics and Gynecology | Admitting: Obstetrics and Gynecology

## 2022-03-03 DIAGNOSIS — N644 Mastodynia: Secondary | ICD-10-CM

## 2022-04-24 ENCOUNTER — Encounter: Payer: Self-pay | Admitting: Internal Medicine

## 2022-04-29 ENCOUNTER — Encounter: Payer: Self-pay | Admitting: Internal Medicine

## 2022-04-29 ENCOUNTER — Ambulatory Visit: Payer: Self-pay | Admitting: Internal Medicine

## 2022-04-29 VITALS — BP 106/70 | HR 72 | Resp 16 | Ht 62.5 in | Wt 153.0 lb

## 2022-04-29 DIAGNOSIS — R052 Subacute cough: Secondary | ICD-10-CM

## 2022-04-29 DIAGNOSIS — Z0000 Encounter for general adult medical examination without abnormal findings: Secondary | ICD-10-CM

## 2022-04-29 DIAGNOSIS — Z23 Encounter for immunization: Secondary | ICD-10-CM

## 2022-04-29 DIAGNOSIS — N920 Excessive and frequent menstruation with regular cycle: Secondary | ICD-10-CM

## 2022-04-29 MED ORDER — ALBUTEROL SULFATE HFA 108 (90 BASE) MCG/ACT IN AERS: 2.0000 | INHALATION_SPRAY | Freq: Four times a day (QID) | RESPIRATORY_TRACT | 1 refills | Status: AC | PRN

## 2022-04-29 NOTE — Progress Notes (Unsigned)
Subjective:    Patient ID: Paula Villegas, female   DOB: 04/29/1975, 47 y.o.   MRN: [id number]   HPI  CPE without pap  1.  Pap:  Last 02/10/2022 and with benign reparative changes with Dr. Harolyn Rutherford at [facility]  2.  Mammogram: [facility] 12.12.2023 and normal.  No family history of breast cancer,  3.  Osteoprevention:  Has 3 servings of yogurt and cheese daily.  Not physically active. Willing to start walking.  4.  Guaiac Cards/FIT:  Last was 08/07/2021 and negative.  Does have anemia, but has chronic menorrhagia.    5.  Colonoscopy:  never.  No family history of colon cancer.    6.  Immunizations:  Has not had influenza or COVID boosters this year.   Immunization History  Administered Date(s) Administered   Moderna Covid-19 Vaccine Bivalent Booster 43yr & up 07/24/2021   PFIZER Comirnaty(Gray Top)Covid-19 Tri-Sucrose Vaccine 05/11/2020, 06/06/2020   Td 12/13/2001   Tdap 03/13/2013     7.  Glucose/Cholesterol:  History of prediabetes.  Last A1C was 2020 and 5.9%.  Cholesterol higher with last check , but acceptable.   Lipid Panel     Component Value Date/Time   CHOL 197 12/26/2018 0942   TRIG 93 12/26/2018 0942   HDL 47 12/26/2018 0942   LDLCALC 133 (H) 12/26/2018 0942   LABVLDL 17 12/26/2018 0942   8.  Mernorrhagia:  Bleeding fairly consistently since July.  [facility] and another placed again a couple of weeks later (along with unknown BCPs to stop bleeding and cramping) after pelvic [place] in lower uterine segment end of September.  Also noted to have a 2.3 fibroid.  She continued to bleed and had second IUD removed and BCPs stopped in December and given Depo provera shot.  She continued to bleed and so was started on Loestrin Fe Mar 31, 2022.   She continues to bleed almost daily--sometimes just a spot, but other times heavy bleeding with clots.  Was told she is anemic again with visit in Jan and she has been taking  Centrum Silver Women 50 + since.   Current Meds  Medication Sig   ibuprofen (ADVIL) 200 MG tablet Take 600 mg by mouth every 6 (six) hours as needed for moderate pain.   loratadine (CLARITIN) 10 MG tablet Take 1 tablet (10 mg total) by mouth daily.   Norethindrone-Ethinyl Estradiol-Fe Biphas (LO LOESTRIN FE) 1 MG-10 MCG / 10 MCG tablet Take 1 tablet by mouth daily.   No Known Allergies  Past Medical History:  Diagnosis Date   Abnormal vaginal bleeding    Bradycardia    BV (bacterial vaginosis)    GERD (gastroesophageal reflux disease)    IUD migration    Ovarian cyst 2014   Prediabetes 07/2018   Preterm labor    PTL x2, PTD x1   Past Surgical History:  Procedure Laterality Date   APPENDECTOMY  2014   Laparoscopic   INTRAUTERINE DEVICE INSERTION  2015   ?copper--states good for 10 years   LAPAROSCOPIC CHOLECYSTECTOMY  2012   Family History  Problem Relation Age of Onset   Hypertension Mother    Cancer Mother 470      cervical   Heart disease Mother        Died today, 42019-04-28when patient in for CPE--MI related to alcohol abuse.   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   Lives with middle and youngest daughter live with her now.  .   Father of children lives in [place], they are no longer a couple.   Older children live on their own now.   Works in Retail banker at [employer], but also Product manager.   Social Determinants of Health   Financial Resource Strain: Low Risk  (12/26/2018)   Overall Financial Resource Strain (CARDIA)    Difficulty of Paying Living Expenses: Not very hard  Food Insecurity: No Food Insecurity (04/29/2022)   Hunger Vital Sign    Worried About Running Out of Food in the Last Year: Never true    Ran Out of Food in the Last Year: Never true  Transportation Needs: No Transportation Needs (07/06/2017)   PRAPARE - Hydrologist (Medical): No    Lack of Transportation (Non-Medical): No  Physical Activity: Inactive (12/26/2018)   Exercise Vital Sign    Days of Exercise per Week: 0 days    Minutes of Exercise per Session: 0 min  Stress: No Stress Concern Present (12/26/2018)   Tamarac    Feeling of Stress : Only a little  Social Connections: Unknown (07/06/2017)   Social Connection and Isolation Panel [NHANES]    Frequency of Communication with Friends and Family: Not on file    Frequency of Social Gatherings with Friends and Family: Not on file    Attends Religious Services: Never    Active Member of Clubs or Organizations: No    Attends Archivist Meetings: Never    Marital Status: Not on file  Intimate Partner Violence: Not At Risk (07/06/2017)   Humiliation, Afraid, Rape, and Kick questionnaire    Fear of Current or Ex-Partner: No    Emotionally Abused: No    Physically Abused: No    Sexually Abused: No      Review of Systems  HENT:  Positive for rhinorrhea.   Respiratory:  Positive for cough (Continues with intermittent coughing  since covid.   Sometimes chest feels tight.  Albuterol helped in past.).       Objective:   BP 106/70 (BP Location: Left Arm, Patient Position: Sitting, Cuff Size: Normal)   Pulse 72   Resp 16   Ht 5' 2.5" (1.588 m)   Wt 153 lb (69.4 kg)  LMP 04/15/2022 (Approximate)   BMI 27.54 kg/m   Physical Exam HENT:     Head: Normocephalic and atraumatic.     Right Ear: Tympanic membrane, ear canal and external ear normal.     Left Ear: Tympanic membrane, ear canal and external ear normal.     Nose: Congestion present.     Mouth/Throat:     Mouth: Mucous membranes are moist.     Pharynx: Oropharynx is clear.  Eyes:     Extraocular Movements: Extraocular movements intact.     Conjunctiva/sclera: Conjunctivae normal.     Pupils: Pupils are equal, round, and reactive to light.     Comments: Discs sharp  Neck:     Thyroid: No thyroid mass or thyromegaly.  Cardiovascular:     Rate and Rhythm: Normal rate and regular rhythm.     Heart sounds: S1 normal and S2 normal. No murmur heard.    No friction rub. No S3 or S4 sounds.     Comments: No carotid bruits.  Carotid, radial, femoral, DP and PT pulses normal and equal.   Pulmonary:     Effort: Pulmonary effort is normal.     Breath sounds: Normal breath sounds and air entry.  Chest:     Comments: [facility] Abdominal:     General: Bowel sounds are normal.     Palpations: Abdomen is soft. There is no hepatomegaly, splenomegaly or mass.     Tenderness: There is no abdominal tenderness.     Hernia: No hernia is present.  Genitourinary:    Comments: Deferred to [facility] Musculoskeletal:        General: Normal range of motion.     Cervical back: Normal range of motion and neck supple.     Right lower leg: No edema.     Left lower leg: No edema.  Lymphadenopathy:     Head:     Right side of head: No submental or submandibular adenopathy.     Left side of head: No submental or submandibular adenopathy.      Cervical: No cervical adenopathy.     Upper Body:     Right upper body: No supraclavicular adenopathy.     Left upper body: No supraclavicular adenopathy.     Lower Body: No right inguinal adenopathy. No left inguinal adenopathy.  Skin:    General: Skin is warm.     Capillary Refill: Capillary refill takes less than 2 seconds.     Findings: No rash.  Neurological:     General: No focal deficit present.     Mental Status: She is alert and oriented to person, place, and time.     Cranial Nerves: Cranial nerves 2-12 are intact.     Sensory: Sensation is intact.     Motor: Motor function is intact.     Coordination: Coordination is intact.     Gait: Gait is intact.     Deep Tendon Reflexes: Reflexes are normal and symmetric.  Psychiatric:        Speech: Speech normal.        Behavior: Behavior normal. Behavior is cooperative.      Assessment & Plan    CPE without pap FIT to return in 2 weeks--to perform when not having vaginal bleeding. CBC, CMP, FLP, A1C fasting tomorrow. Spikevax COvID booster.  2.  URI Supportive care with Albuterol HFA 2 puffs every 6 hours as needed if sense of wheezing.  3.  Menorrhagia with  recurrent anemia:  After seeing Dr. Arther Abbott name on last pap from November, not clear patient is being followed at [facility] or [facility] with [facility].  However, will refer to [facility] on [address] to address this continued issue.  Discussed she may need to give BCPs a bit longer to see if bleeding improves.  May need something more definitive performed.  CBC tomorrow.  4.  History of hyperglycemia:  A1C with labs

## 2022-04-30 ENCOUNTER — Other Ambulatory Visit: Payer: Self-pay

## 2022-04-30 DIAGNOSIS — R7303 Prediabetes: Secondary | ICD-10-CM

## 2022-04-30 DIAGNOSIS — Z0000 Encounter for general adult medical examination without abnormal findings: Secondary | ICD-10-CM

## 2022-04-30 DIAGNOSIS — D649 Anemia, unspecified: Secondary | ICD-10-CM

## 2022-05-01 LAB — CBC WITH DIFFERENTIAL/PLATELET
Basophils Absolute: 0 x10E3/uL (ref 0.0–0.2)
Basos: 0 %
EOS (ABSOLUTE): 0.1 x10E3/uL (ref 0.0–0.4)
Eos: 1 %
Hematocrit: 35.2 % (ref 34.0–46.6)
Hemoglobin: 11 g/dL — ABNORMAL LOW (ref 11.1–15.9)
Immature Grans (Abs): 0 x10E3/uL (ref 0.0–0.1)
Immature Granulocytes: 0 %
Lymphocytes Absolute: 1.5 x10E3/uL (ref 0.7–3.1)
Lymphs: 18 %
MCH: 24.6 pg — ABNORMAL LOW (ref 26.6–33.0)
MCHC: 31.3 g/dL — ABNORMAL LOW (ref 31.5–35.7)
MCV: 79 fL (ref 79–97)
Monocytes Absolute: 0.4 x10E3/uL (ref 0.1–0.9)
Monocytes: 5 %
Neutrophils Absolute: 6.3 x10E3/uL (ref 1.4–7.0)
Neutrophils: 76 %
Platelets: 429 x10E3/uL (ref 150–450)
RBC: 4.47 x10E6/uL (ref 3.77–5.28)
RDW: 15 % (ref 11.7–15.4)
WBC: 8.3 x10E3/uL (ref 3.4–10.8)

## 2022-05-01 LAB — LIPID PANEL W/O CHOL/HDL RATIO
Cholesterol, Total: 149 mg/dL (ref 100–199)
HDL: 34 mg/dL — ABNORMAL LOW
LDL Chol Calc (NIH): 96 mg/dL (ref 0–99)
Triglycerides: 101 mg/dL (ref 0–149)
VLDL Cholesterol Cal: 19 mg/dL (ref 5–40)

## 2022-05-01 LAB — COMPREHENSIVE METABOLIC PANEL WITH GFR
ALT: 32 IU/L (ref 0–32)
AST: 15 IU/L (ref 0–40)
Albumin/Globulin Ratio: 1.7 (ref 1.2–2.2)
Albumin: 4.3 g/dL (ref 3.9–4.9)
Alkaline Phosphatase: 70 IU/L (ref 44–121)
BUN/Creatinine Ratio: 16 (ref 9–23)
BUN: 14 mg/dL (ref 6–24)
Bilirubin Total: 0.4 mg/dL (ref 0.0–1.2)
CO2: 20 mmol/L (ref 20–29)
Calcium: 8.6 mg/dL — ABNORMAL LOW (ref 8.7–10.2)
Chloride: 106 mmol/L (ref 96–106)
Creatinine, Ser: 0.88 mg/dL (ref 0.57–1.00)
Globulin, Total: 2.6 g/dL (ref 1.5–4.5)
Glucose: 105 mg/dL — ABNORMAL HIGH (ref 70–99)
Potassium: 4.6 mmol/L (ref 3.5–5.2)
Sodium: 138 mmol/L (ref 134–144)
Total Protein: 6.9 g/dL (ref 6.0–8.5)
eGFR: 81 mL/min/1.73

## 2022-05-01 LAB — HEMOGLOBIN A1C
Est. average glucose Bld gHb Est-mCnc: 131 mg/dL
Hgb A1c MFr Bld: 6.2 % — ABNORMAL HIGH (ref 4.8–5.6)

## 2022-07-13 ENCOUNTER — Telehealth: Payer: Self-pay

## 2022-07-13 ENCOUNTER — Other Ambulatory Visit: Payer: Self-pay

## 2022-07-13 DIAGNOSIS — R059 Cough, unspecified: Secondary | ICD-10-CM

## 2022-07-13 LAB — POCT INFLUENZA A/B
Influenza A, POC: NEGATIVE
Influenza B, POC: NEGATIVE

## 2022-07-13 LAB — POC COVID19 BINAXNOW: SARS Coronavirus 2 Ag: NEGATIVE

## 2022-07-13 NOTE — Progress Notes (Unsigned)
Patient come in for Covid and influenza test after experiencing symptoms of cough, tear eyes and sore throat since 07/03/22. Symptoms have gotten progressively worse, and now has pain near her ears. Has taken allergy pills, cough drops and robitussin, but none has helped. No one else in the home is sick.   Covid and influenza test were negative. Will call patient once an appointment becomes available.

## 2022-07-13 NOTE — Telephone Encounter (Signed)
Patient needs an appointment ,because her throat hurts and hard to pass saliva and cough.  Patient had been taken allergy medicine and robitussin but it has not helped her, patient said at night her throat hurts and it runs back to her ears. Patient will be coming in for a Covid/Flu test.

## 2022-07-15 ENCOUNTER — Ambulatory Visit: Payer: Self-pay | Admitting: Internal Medicine

## 2022-07-15 VITALS — BP 112/70 | HR 76 | Resp 16 | Ht 62.5 in | Wt 156.0 lb

## 2022-07-15 DIAGNOSIS — J302 Other seasonal allergic rhinitis: Secondary | ICD-10-CM

## 2022-07-15 MED ORDER — LEVOCETIRIZINE DIHYDROCHLORIDE 5 MG PO TABS: 5.0000 mg | ORAL_TABLET | Freq: Every evening | ORAL | 11 refills | Status: AC

## 2022-07-15 MED ORDER — OLOPATADINE HCL 0.1 % OP SOLN: 1.0000 [drp] | Freq: Two times a day (BID) | OPHTHALMIC | 11 refills | Status: AC

## 2022-07-15 NOTE — Telephone Encounter (Signed)
Patient has been scheduled

## 2022-07-15 NOTE — Progress Notes (Unsigned)
    Subjective:    Patient ID: Paula Villegas, female   DOB: 12-25-1975, 47 y.o.   MRN: [id number]   HPI  Started on 13th of April with itchy throat and eyes, so thought was allergies.  Started loratadine 10 mg daily.  Eventually developed a sore throat, so was taking ibuprofen sinus last night, and helped a bit.  She was trying Robitussin previously as well, without improvement.   She still has the itchy eyes and throat along with sore throat.  No sneezing.   No fever.   Not outside much.  Windows closed.  Has trees about home, but only 3.   Influenza and COVID testing negative yesterday.    Current Meds  Medication Sig   albuterol (VENTOLIN HFA) 108 (90 Base) MCG/ACT inhaler Inhale 2 puffs into the lungs every 6 (six) hours as needed for wheezing or shortness of breath.   ibuprofen (ADVIL) 200 MG tablet Take 600 mg by mouth every 6 (six) hours as needed for moderate pain.   loratadine (CLARITIN) 10 MG tablet Take 1 tablet (10 mg total) by mouth daily.   No Known Allergies   Review of Systems    Objective:   BP 112/70 (BP Location: Left Arm, Patient Position: Sitting, Cuff Size: Normal)   Pulse 76   Resp 16   Ht 5' 2.5" (1.588 m)   Wt 156 lb (70.8 kg)   BMI 28.08 kg/m   Physical Exam   Assessment & Plan

## 2022-11-03 ENCOUNTER — Emergency Department (HOSPITAL_COMMUNITY)
Admission: EM | Admit: 2022-11-03 | Discharge: 2022-11-04 | Disposition: A | Discharge status: Home / Self Care | Payer: Self-pay | Attending: Emergency Medicine | Admitting: Emergency Medicine

## 2022-11-03 ENCOUNTER — Encounter (HOSPITAL_COMMUNITY): Payer: Self-pay | Admitting: Emergency Medicine

## 2022-11-03 ENCOUNTER — Other Ambulatory Visit: Payer: Self-pay

## 2022-11-03 DIAGNOSIS — M79651 Pain in right thigh: Secondary | ICD-10-CM | POA: Insufficient documentation

## 2022-11-03 DIAGNOSIS — N938 Other specified abnormal uterine and vaginal bleeding: Secondary | ICD-10-CM | POA: Insufficient documentation

## 2022-11-03 DIAGNOSIS — E876 Hypokalemia: Secondary | ICD-10-CM | POA: Insufficient documentation

## 2022-11-03 DIAGNOSIS — R7309 Other abnormal glucose: Secondary | ICD-10-CM | POA: Insufficient documentation

## 2022-11-03 DIAGNOSIS — M79652 Pain in left thigh: Secondary | ICD-10-CM | POA: Insufficient documentation

## 2022-11-03 DIAGNOSIS — R8289 Other abnormal findings on cytological and histological examination of urine: Secondary | ICD-10-CM | POA: Insufficient documentation

## 2022-11-03 DIAGNOSIS — R1030 Lower abdominal pain, unspecified: Secondary | ICD-10-CM | POA: Insufficient documentation

## 2022-11-03 LAB — URINALYSIS, ROUTINE W REFLEX MICROSCOPIC
Bilirubin Urine: NEGATIVE
Glucose, UA: NEGATIVE mg/dL
Ketones, ur: NEGATIVE mg/dL
Leukocytes,Ua: NEGATIVE
Nitrite: NEGATIVE
Protein, ur: NEGATIVE mg/dL
RBC / HPF: >50 RBC/hpf (ref 0–5)
Specific Gravity, Urine: 1.016 (ref 1.005–1.030)
pH: 7 (ref 5.0–8.0)

## 2022-11-03 LAB — CBC WITH DIFFERENTIAL/PLATELET
Abs Immature Granulocytes: 0.03 10*3/uL (ref 0.00–0.07)
Basophils Absolute: 0 10*3/uL (ref 0.0–0.1)
Basophils Relative: 1 %
Eosinophils Absolute: 0.1 10*3/uL (ref 0.0–0.5)
Eosinophils Relative: 1 %
HCT: 39.1 % (ref 36.0–46.0)
Hemoglobin: 12.6 g/dL (ref 12.0–15.0)
Immature Granulocytes: 0 %
Lymphocytes Relative: 36 %
Lymphs Abs: 3 10*3/uL (ref 0.7–4.0)
MCH: 28.6 pg (ref 26.0–34.0)
MCHC: 32.2 g/dL (ref 30.0–36.0)
MCV: 88.7 fL (ref 80.0–100.0)
Monocytes Absolute: 0.6 10*3/uL (ref 0.1–1.0)
Monocytes Relative: 7 %
Neutro Abs: 4.5 10*3/uL (ref 1.7–7.7)
Neutrophils Relative %: 55 %
Platelets: 316 10*3/uL (ref 150–400)
RBC: 4.41 MIL/uL (ref 3.87–5.11)
RDW: 17.9 % — ABNORMAL HIGH (ref 11.5–15.5)
WBC: 8.3 10*3/uL (ref 4.0–10.5)
nRBC: 0 % (ref 0.0–0.2)

## 2022-11-03 LAB — PREGNANCY, URINE: Preg Test, Ur: NEGATIVE

## 2022-11-03 NOTE — ED Triage Notes (Signed)
Patient reports vaginal bleeding x 14 months, placed on provera.  Patient reports that on Sunday, she started having lower back pain and abdominal cramping.

## 2022-11-04 ENCOUNTER — Emergency Department (HOSPITAL_COMMUNITY): Payer: Self-pay

## 2022-11-04 LAB — WET PREP, GENITAL
Clue Cells Wet Prep HPF POC: NONE SEEN
Sperm: NONE SEEN
Trich, Wet Prep: NONE SEEN
WBC, Wet Prep HPF POC: <10 (ref <10)
Yeast Wet Prep HPF POC: NONE SEEN

## 2022-11-04 MED ORDER — MEGESTROL ACETATE 40 MG PO TABS
40.0000 mg | ORAL_TABLET | Freq: Every day | ORAL | 0 refills | Status: DC
Start: 2022-11-04 — End: 2022-11-12

## 2022-11-04 NOTE — Discharge Instructions (Addendum)
I have started you on Megace please take as prescribed this should help with vaginal bleeding.  Your gonorrhea and Chlamydia tests are pending, you see results next 48 hours on your MyChart account  Please follow-up with women's health for further evaluation  Come back to the emergency department if you develop chest pain, shortness of breath, severe abdominal pain, uncontrolled nausea, vomiting, diarrhea.

## 2022-11-04 NOTE — ED Provider Notes (Signed)
Signout received on this 47 year old female who presented for vaginal bleeding.  Workup overall reassuring so far.  She is pending pelvic ultrasound, and a wet prep at the time of signout.  Megace was prescribed by previous provider.  Please see their note for full details. Physical Exam  BP 116/64 (BP Location: Right Arm)   Pulse 61   Temp 97.7 F (36.5 C) (Oral)   Resp 20   Wt 69.4 kg   SpO2 100%   BMI 27.54 kg/m     Procedures  Procedures  ED Course / MDM    Medical Decision Making Amount and/or Complexity of Data Reviewed Labs: ordered. Radiology: ordered.  Risk Prescription drug management.   Pelvic ultrasound without acute abnormality.  Wet prep without acute concerns.  Patient discharged in stable condition.  She has an appointment scheduled with gynecology office for next week.       Marita Kansas, PA-C 11/04/22 4010    Gloris Manchester, MD 11/04/22 [phone]

## 2022-11-04 NOTE — ED Provider Notes (Addendum)
[place] EMERGENCY DEPARTMENT AT [facility] Provider Note   CSN: [id number] Arrival date & time: 11/03/22  2257     History  Chief Complaint  Patient presents with   Vaginal Bleeding    Paula Villegas is a 47 y.o. female.  HPI   Patient with medical history including GERD, obesity, prediabetes, IUD, presenting with complaints of vaginal bleeding, states has been going for last 14 months, but over the last 4 months she felt like it is getting worse.  Patient states that she has been evaluated at the health department, has had 2 IUDs placed without improvement of her vaginal bleeding. , she  now get Depo shots, states that she was placed on Provera without much relief.  She states that she has had ultrasounds performed shows that she has fibroids, she supposed to follow-up with women's health next week.  States she is here because she is having some lower abdominal cramping, states is also been constant, states has been in her suprapubic region, does not radiate, no associated nausea vomiting, no urinary symptoms, no vaginal discharge, she denies any history of ectopic pregnancy or ovarian torsion's, patient also admits to bilateral posterior upper leg pain, going on for last few days, denies any leg swelling or calf legsTenderness to lateral, no history of PEs or DVTs, not on oral birth control, denies any chest pain shortness of breath.    Home Medications Prior to Admission medications   Medication Sig Start Date End Date Taking? Authorizing Provider  megestrol (MEGACE) 40 MG tablet Take 1 tablet (40 mg total) by mouth daily for 10 days. 11/04/22 11/14/22 Yes Carroll Sage, PA-C  albuterol (VENTOLIN HFA) 108 (90 Base) MCG/ACT inhaler Inhale 2 puffs into the lungs every 6 (six) hours as needed for wheezing or shortness of breath. 04/29/22   Julieanne Manson, MD  ibuprofen (ADVIL) 200 MG tablet Take 600 mg by mouth every 6 (six) hours as needed for moderate pain.     [provider]  levocetirizine (XYZAL) 5 MG tablet Take 1 tablet (5 mg total) by mouth every evening. 07/15/22   Julieanne Manson, MD  Norethindrone-Ethinyl Estradiol-Fe Biphas (LO LOESTRIN FE) 1 MG-10 MCG / 10 MCG tablet Take 1 tablet by mouth daily. Patient not taking: Reported on 07/15/2022    [provider]  olopatadine (PATANOL) 0.1 % ophthalmic solution Place 1 drop into both eyes 2 (two) times daily. 07/15/22   Julieanne Manson, MD      Allergies    Patient has no known allergies.    Review of Systems   Review of Systems  Constitutional:  Negative for chills and fever.  Respiratory:  Negative for shortness of breath.   Cardiovascular:  Negative for chest pain.  Gastrointestinal:  Positive for abdominal pain. Negative for rectal pain and vomiting.  Genitourinary:  Positive for vaginal bleeding. Negative for vaginal discharge and vaginal pain.  Neurological:  Negative for headaches.    Physical Exam Updated Vital Signs BP (!) 151/82   Pulse 64   Temp 98 F (36.7 C)   Resp 18   Wt 69.4 kg   SpO2 100%   BMI 27.54 kg/m  Physical Exam Vitals and nursing note reviewed.  Constitutional:      General: She is not in acute distress.    Appearance: She is not ill-appearing.  HENT:     Head: Normocephalic and atraumatic.     Nose: No congestion.  Eyes:     Conjunctiva/sclera: Conjunctivae  normal.  Cardiovascular:     Rate and Rhythm: Normal rate and regular rhythm.     Pulses: Normal pulses.     Heart sounds: No murmur heard.    No friction rub. No gallop.  Pulmonary:     Effort: No respiratory distress.     Breath sounds: No wheezing, rhonchi or rales.  Abdominal:     Palpations: Abdomen is soft.     Tenderness: There is abdominal tenderness. There is no right CVA tenderness or left CVA tenderness.     Comments: Abdomen nondistended, soft, slight tenderness in the suprapubic/left and right adnexal area, without guarding rebound or peritoneal sign  no flank tenderness or CVA tenderness.  Musculoskeletal:     Right lower leg: No edema.     Left lower leg: No edema.     Comments: No unilateral leg swelling no calf tenderness no palpable cords, patient has nonfocalizes tenderness on the posterior aspect of the upper thighs bilaterally.  Skin:    General: Skin is warm and dry.  Neurological:     Mental Status: She is alert.  Psychiatric:        Mood and Affect: Mood normal.     ED Results / Procedures / Treatments   Labs (all labs ordered are listed, but only abnormal results are displayed) Labs Reviewed  CBC WITH DIFFERENTIAL/PLATELET - Abnormal; Notable for the following components:      Result Value   RDW 17.9 (*)    All other components within normal limits  COMPREHENSIVE METABOLIC PANEL - Abnormal; Notable for the following components:   Potassium 3.4 (*)    CO2 21 (*)    Glucose, Bld 120 (*)    Calcium 8.5 (*)    All other components within normal limits  URINALYSIS, ROUTINE W REFLEX MICROSCOPIC - Abnormal; Notable for the following components:   Color, Urine STRAW (*)    Hgb urine dipstick MODERATE (*)    Bacteria, UA RARE (*)    All other components within normal limits  WET PREP, GENITAL  PREGNANCY, URINE  GC/CHLAMYDIA PROBE AMP ([place]) NOT AT [facility]    EKG None  Radiology US Pelvis Complete  Result Date: 11/04/2022 CLINICAL DATA:  Suprapubic and adnexal pain EXAM: TRANSABDOMINAL AND TRANSVAGINAL ULTRASOUND OF PELVIS DOPPLER ULTRASOUND OF OVARIES TECHNIQUE: Both transabdominal and transvaginal ultrasound examinations of the pelvis were performed. Transabdominal technique was performed for global imaging of the pelvis including uterus, ovaries, adnexal regions, and pelvic cul-de-sac. It was necessary to proceed with endovaginal exam following the transabdominal exam to visualize the ovaries. Color and duplex Doppler ultrasound was utilized to evaluate blood flow to the ovaries. COMPARISON:  abdominal CT  08/01/2019 FINDINGS: Uterus Measurements: 9 x 6 x 6 cm = volume: 160 mL. Three small intramural masses with hypoechoic appearance, consistent with fibroid and measuring up to 17 mm. Endometrium Thickness: 6 mm.  No focal abnormality visualized. Right ovary Measurements: 33 x 16 x 13 mm.  Normal appearance/no adnexal mass. Left ovary Measurements: 31 x 17 x 23 mm.  Normal appearance/no adnexal mass. Pulsed Doppler evaluation of both ovaries demonstrates normal low-resistance arterial and venous waveforms. Other findings No abnormal free fluid. IMPRESSION: No acute finding.  Normal ovarian blood flow. Electronically Signed   By: Tiburcio Pea M.D.   On: 11/04/2022 06:41   US Transvaginal Non-OB  Result Date: 11/04/2022 CLINICAL DATA:  Suprapubic and adnexal pain EXAM: TRANSABDOMINAL AND TRANSVAGINAL ULTRASOUND OF PELVIS DOPPLER ULTRASOUND OF OVARIES TECHNIQUE: Both transabdominal  and transvaginal ultrasound examinations of the pelvis were performed. Transabdominal technique was performed for global imaging of the pelvis including uterus, ovaries, adnexal regions, and pelvic cul-de-sac. It was necessary to proceed with endovaginal exam following the transabdominal exam to visualize the ovaries. Color and duplex Doppler ultrasound was utilized to evaluate blood flow to the ovaries. COMPARISON:  abdominal CT 08/01/2019 FINDINGS: Uterus Measurements: 9 x 6 x 6 cm = volume: 160 mL. Three small intramural masses with hypoechoic appearance, consistent with fibroid and measuring up to 17 mm. Endometrium Thickness: 6 mm.  No focal abnormality visualized. Right ovary Measurements: 33 x 16 x 13 mm.  Normal appearance/no adnexal mass. Left ovary Measurements: 31 x 17 x 23 mm.  Normal appearance/no adnexal mass. Pulsed Doppler evaluation of both ovaries demonstrates normal low-resistance arterial and venous waveforms. Other findings No abnormal free fluid. IMPRESSION: No acute finding.  Normal ovarian blood flow.  Electronically Signed   By: Tiburcio Pea M.D.   On: 11/04/2022 06:41   [place] Art/Ven Flow Abd Pelv Doppler  Result Date: 11/04/2022 CLINICAL DATA:  Suprapubic and adnexal pain EXAM: TRANSABDOMINAL AND TRANSVAGINAL ULTRASOUND OF PELVIS DOPPLER ULTRASOUND OF OVARIES TECHNIQUE: Both transabdominal and transvaginal ultrasound examinations of the pelvis were performed. Transabdominal technique was performed for global imaging of the pelvis including uterus, ovaries, adnexal regions, and pelvic cul-de-sac. It was necessary to proceed with endovaginal exam following the transabdominal exam to visualize the ovaries. Color and duplex Doppler ultrasound was utilized to evaluate blood flow to the ovaries. COMPARISON:  abdominal CT 08/01/2019 FINDINGS: Uterus Measurements: 9 x 6 x 6 cm = volume: 160 mL. Three small intramural masses with hypoechoic appearance, consistent with fibroid and measuring up to 17 mm. Endometrium Thickness: 6 mm.  No focal abnormality visualized. Right ovary Measurements: 33 x 16 x 13 mm.  Normal appearance/no adnexal mass. Left ovary Measurements: 31 x 17 x 23 mm.  Normal appearance/no adnexal mass. Pulsed Doppler evaluation of both ovaries demonstrates normal low-resistance arterial and venous waveforms. Other findings No abnormal free fluid. IMPRESSION: No acute finding.  Normal ovarian blood flow. Electronically Signed   By: Tiburcio Pea M.D.   On: 11/04/2022 06:41    Procedures Procedures    Medications Ordered in ED Medications - No data to display  ED Course/ Medical Decision Making/ A&P                                 Medical Decision Making Amount and/or Complexity of Data Reviewed Labs: ordered. Radiology: ordered.  Risk Prescription drug management.   This patient presents to the ED for concern of vaginal bleeding pelvic pain, this involves an extensive number of treatment options, and is a complaint that carries with it a high risk of complications and  morbidity.  The differential diagnosis includes PID, ectopic pregnancy, ovarian torsion, malignancy, UTI,   Additional history obtained:  Additional history obtained from partner at bedside External records from outside source obtained and reviewed including PCP notes   Co morbidities that complicate the patient evaluation  Fibroids  Social Determinants of Health:  N/A   Lab Tests:  I Ordered, and personally interpreted labs.  The pertinent results include: CBC unremarkable, CMP reveals potassium 3.4 CO2 21 glucose 120 calcium 8.5, UA shows red blood cells rare bacteria mucus, urine pregnancy negative   Imaging Studies ordered:  I ordered imaging studies including vaginal ultrasound I independently visualized and interpreted imaging  which showed pending I agree with the radiologist interpretation   Cardiac Monitoring:  The patient was maintained on a cardiac monitor.  I personally viewed and interpreted the cardiac monitored which showed an underlying rhythm of: N/A   Medicines ordered and prescription drug management:  I ordered medication including N/A I have reviewed the patients home medicines and have made adjustments as needed  Critical Interventions:  N/A   Reevaluation:  Presents with vaginal bleeding pelvic pain, she had some slight suprapubic and adnexal pain, lab workup is unremarkable, will send on for ultrasound for further assessment   Consultations Obtained:  N/a    Test Considered:  CT abdomen pelvis-deferred as she has a nonsurgical abdomen    Rule out Suspicion for ectopic pregnancy is low at this time as urine pregnancy negative.  I doubt PID denies any vaginal discharge, presentation atypical, endorsing vaginal bleeding for 14 months, likely this is dysfunctional vaginal bleeding.  I doubt UTI Pilo or kidney stone not endorsing any urinary symptoms, UA is negative for signs of infection.  Suspicion for appendicitis or diverticulitis  is low she is not having any right or left lower abdominal tenderness, patient denies any nausea vomiting diarrhea, states that she had to have this pain for the last 14 months, I suspect this is likely related to her dysfunctional vaginal bleeding possibly from fibroids.  Suspicion for PE is low at this time denies any chest pain shortness of breath, she is PERC negative.  Doubt DVT no palpable cords and no leg swelling presentation atypical.        Dispostion and problem list  Due to shift change patient off to [facility]  Follow-up on ultrasound and wet prep if unremarkable discharge home on Megace, follow-up with her OB/GYN for further evaluation.           Final Clinical Impression(s) / ED Diagnoses Final diagnoses:  Dysfunctional uterine bleeding    Rx / DC Orders ED Discharge Orders          Ordered    megestrol (MEGACE) 40 MG tablet  Daily        11/04/22 0627              Carroll Sage, PA-C 11/04/22 0646    Carroll Sage, PA-C 11/04/22 1610    Gilda Crease, MD 11/06/22 0700

## 2022-11-05 LAB — GC/CHLAMYDIA PROBE AMP (~~LOC~~) NOT AT ARMC
Chlamydia: NEGATIVE
Comment: NEGATIVE
Comment: NORMAL
Neisseria Gonorrhea: NEGATIVE

## 2022-11-12 ENCOUNTER — Other Ambulatory Visit: Payer: Self-pay

## 2022-11-12 ENCOUNTER — Other Ambulatory Visit (HOSPITAL_COMMUNITY)
Admission: RE | Admit: 2022-11-12 | Discharge: 2022-11-12 | Disposition: A | Discharge status: Home / Self Care | Payer: Self-pay | Source: Ambulatory Visit | Attending: Obstetrics and Gynecology | Admitting: Obstetrics and Gynecology

## 2022-11-12 ENCOUNTER — Ambulatory Visit (INDEPENDENT_AMBULATORY_CARE_PROVIDER_SITE_OTHER): Payer: Self-pay | Admitting: Obstetrics and Gynecology

## 2022-11-12 VITALS — BP 117/86 | HR 64 | Wt 158.8 lb

## 2022-11-12 DIAGNOSIS — N939 Abnormal uterine and vaginal bleeding, unspecified: Secondary | ICD-10-CM | POA: Insufficient documentation

## 2022-11-12 LAB — POCT PREGNANCY, URINE: Preg Test, Ur: NEGATIVE

## 2022-11-12 MED ORDER — NORETHINDRONE ACETATE 5 MG PO TABS
5.0000 mg | ORAL_TABLET | Freq: Every day | ORAL | 2 refills | Status: DC
Start: 2022-11-12 — End: 2023-02-15

## 2022-11-12 NOTE — Progress Notes (Signed)
NEW GYNECOLOGY PATIENT Patient name: Paula Villegas MRN [id number]  Date of birth: 14-Jun-1975 Chief Complaint:   Vaginal Bleeding     History:  Paula Villegas is a 47 y.o. [id number] being seen today for AUB.  Worsening bleeding over the last 14 months wtihotu improvement. Has had 2 IUDs, depo, and pills without improvement. Will have lower abdominal cramping and front leg pain. No abnormal vaginal discharge. Daily bleeding, may stop overnight. Has not had an endometrial biopsy. No weight changes, has been losing some hair, no skin changes. Not currently sexually active. Last depo was in June she believes.   Bleeding started June 16 and has received 2 mini treatments without success. At most, changing 10-12 pads a day. Stopped taking megace due to reading the side effect. No prior history of irregular bleeding.       Gynecologic History No LMP recorded. (Menstrual status: Irregular Periods). Contraception: abstinence Last Pap: 01/2022. Result was normal with negative HPV Last Mammogram: 02/2022.  Result was normal Last Colonoscopy: n/a  Obstetric History OB History  Gravida Para Term Preterm AB Living  5 4 3 1 1 4   SAB IAB Ectopic Multiple Live Births  1       4    # Outcome Date GA Lbr Len/2nd Weight Sex Type Anes PTL Lv  5 Term 05/18/13 [redacted]w[redacted]d 01:30 / 00:23 8 lb 2.8 oz (3.708 kg) F Vag-Spont None  LIV  4 SAB 2006          3 Term 11/18/01 [redacted]w[redacted]d  7 lb 15 oz (3.6 kg) F Vag-Spont   LIV     Birth Comments: No complications   2 Preterm 1998 [redacted]w[redacted]d  4 lb 15 oz (2.24 kg) M Vag-Spont   LIV     Birth Comments: PTD, PTL; Delivered in [place]   1 Term 1994 [redacted]w[redacted]d  6 lb 6 oz (2.892 kg) F Vag-Spont   LIV     Birth Comments: No complications.     Past Medical History:  Diagnosis Date   Abnormal vaginal bleeding    Bradycardia    BV (bacterial vaginosis)    GERD (gastroesophageal reflux disease)    IUD migration    Ovarian cyst 2014   Prediabetes 07/2018   Preterm labor    PTL  x2, PTD x1    Past Surgical History:  Procedure Laterality Date   APPENDECTOMY  2014   Laparoscopic   INTRAUTERINE DEVICE INSERTION  2015   ?copper--states good for 10 years   LAPAROSCOPIC CHOLECYSTECTOMY  2012    Current Outpatient Medications on File Prior to Visit  Medication Sig Dispense Refill   albuterol (VENTOLIN HFA) 108 (90 Base) MCG/ACT inhaler Inhale 2 puffs into the lungs every 6 (six) hours as needed for wheezing or shortness of breath. 8.5 g 1   ibuprofen (ADVIL) 200 MG tablet Take 600 mg by mouth every 6 (six) hours as needed for moderate pain.     levocetirizine (XYZAL) 5 MG tablet Take 1 tablet (5 mg total) by mouth every evening. 30 tablet 11   megestrol (MEGACE) 40 MG tablet Take 1 tablet (40 mg total) by mouth daily for 10 days. 10 tablet 0   Norethindrone-Ethinyl Estradiol-Fe Biphas (LO LOESTRIN FE) 1 MG-10 MCG / 10 MCG tablet Take 1 tablet by mouth daily. (Patient not taking: Reported on 07/15/2022)     olopatadine (PATANOL) 0.1 % ophthalmic solution Place 1 drop into both eyes 2 (two) times daily. 5 mL 11  No current facility-administered medications on file prior to visit.    No Known Allergies  Social History:  reports that she has never smoked. She has never been exposed to tobacco smoke. She has never used smokeless tobacco. She reports current alcohol use. She reports that she does not use drugs.  Family History  Problem Relation Age of Onset   Hypertension Mother    Cancer Mother 39       cervical   Heart disease Mother        Died today, 07-12-2017 when patient in for CPE--MI related to alcohol abuse.   Asthma Brother    Diabetes Brother    Diabetes Father    Alcohol abuse Father    Heart disease Father    Seizures Brother 34       post traumatic brain injury --pedestrian hit by car   Hearing loss Neg Hx     The following portions of the patient's history were reviewed and updated as appropriate: allergies, current medications, past family  history, past medical history, past social history, past surgical history and problem list.  Review of Systems Pertinent items noted in HPI and remainder of comprehensive ROS otherwise negative.  Physical Exam:  BP 117/86   Pulse 64   Wt 158 lb 12.8 oz (72 kg)   BMI 28.58 kg/m  Physical Exam Vitals and nursing note reviewed. Exam conducted with a chaperone present.  Constitutional:      Appearance: Normal appearance.  Cardiovascular:     Rate and Rhythm: Normal rate.  Pulmonary:     Effort: Pulmonary effort is normal.     Breath sounds: Normal breath sounds.  Genitourinary:    General: Normal vulva.     Exam position: Lithotomy position.     Comments: Retroverted uterus, mobile  ?polypoid tissue at cervical os Neurological:     General: No focal deficit present.     Mental Status: She is alert and oriented to person, place, and time.  Psychiatric:        Mood and Affect: Mood normal.        Behavior: Behavior normal.        Thought Content: Thought content normal.        Judgment: Judgment normal.    Endometrial Biopsy Procedure  Patient identified, informed consent performed,  indication reviewed, consent signed.  Reviewed risk of perforation, pain, bleeding, insufficient sample, etc were reviewd. Time out was performed.  Urine pregnancy test negative.  Speculum placed in the vagina.  Cervix visualized.  Cleaned with Betadine x 2.  Anterior cervix was not grasped anteriorly with a single tooth tenaculum.  Paracervical block was not administered.  Endometrial pipelle was used to draw up 1cc of 1% lidocaine, introduced into the cervical os and instilled into the endometrial cavity.  The pipelle was passed twice without difficulty and sample obtained. Tenaculum was removed, good hemostasis noted.  Patient tolerated procedure well.  Patient was given post-procedure instructions.      Assessment and Plan:   1. Abnormal uterine bleeding (AUB) Additional labs for AUB workup  today, prior TSH abnormal with normal T4, repeat today. Patient has abnormal uterine bleeding . She has a slightly enlarged uterus on exam.  Will order abnormal uterine bleeding evaluation labs.  Will contact patient with these results and plans for further evaluation/management. Now s/p uncomplicated EMB, will follow up surgical pathology.  Not taking megace, will trial aygestin for AUB.  - TSH Rfx on Abnormal to Free T4 -  Follicle stimulating hormone - Hemoglobin A1c - Surgical pathology   Routine preventative health maintenance measures emphasized. Please refer to After Visit Summary for other counseling recommendations.   Follow-up: Return for GYN Follow Up.      Lorriane Shire, MD Obstetrician & Gynecologist, [facility] [employer], [facility]

## 2022-11-13 ENCOUNTER — Other Ambulatory Visit: Payer: Self-pay | Admitting: Obstetrics and Gynecology

## 2022-11-13 DIAGNOSIS — E038 Other specified hypothyroidism: Secondary | ICD-10-CM

## 2022-11-13 LAB — FOLLICLE STIMULATING HORMONE: FSH: 9.8 m[IU]/mL

## 2022-11-13 LAB — HEMOGLOBIN A1C
Est. average glucose Bld gHb Est-mCnc: 131 mg/dL
Hgb A1c MFr Bld: 6.2 % — ABNORMAL HIGH (ref 4.8–5.6)

## 2022-11-13 LAB — T4F: T4,Free (Direct): 1.02 ng/dL (ref 0.82–1.77)

## 2022-11-13 LAB — TSH RFX ON ABNORMAL TO FREE T4: TSH: 5.01 u[IU]/mL — ABNORMAL HIGH (ref 0.450–4.500)

## 2022-11-13 LAB — SURGICAL PATHOLOGY

## 2022-12-28 ENCOUNTER — Telehealth: Payer: Self-pay | Admitting: Family Medicine

## 2022-12-28 NOTE — Telephone Encounter (Signed)
Spanish/Patient has been waiting for a phone call back from appt. She was seen with Dr. Briscoe Deutscher but was never had a follow up phone call or appt. Would like a nurse to return her call

## 2022-12-29 NOTE — Telephone Encounter (Signed)
Called pt with interpreter Claudia. Pt reports ongoing abdominal and back pain and is concerned about possibility of ovarian cysts. Reports vaginal bleeding that began yesterday after missing an Aygestin dose on Sunday (12/27/22). States she does not want to take medication every day for vaginal bleeding. Was seen at [facility] department on 11/25/22 and given Depo Provera injection. Reviewed with patient I am unsure if Aygestin is safe to take with Depo Provera. Asked that she stop Aygestin until Dr. Briscoe Deutscher has confirmed this is safe. Will call patient back. Offered follow up visit with [facility] to discuss plan for bleeding management and concern about ovarian cyst, which patient accepts. Front office to contact patient.

## 2023-01-01 ENCOUNTER — Telehealth: Payer: Self-pay

## 2023-01-01 NOTE — Telephone Encounter (Signed)
Would prefer she only be on one, so she can stop the aygestin and see if the bleeding is controlled with depo alone.  Prior US did not show any ovarian cysts at that time. Would recommend exam in clinic to assess pain.  Also, she will hopefully see endo soon. If they don't have a soon appointment, can discuss subclinical hypothyroid with primary care.  Ajewole   Called pt with interpreter Debarah Crape to give updated provider recommendation. Pt reports she is still having vaginal bleeding. Using approx 7 pads each day. Pads are not saturated before changing. Encouraged pt to call back if she is still bleeding at the end of next week so that we can update provider. Plans to follow up with [facility] on 02/11/23. Will see endocrinology on 03/10/23. Encouraged pt to follow up with PCP in the meantime.

## 2023-01-01 NOTE — Telephone Encounter (Signed)
Patient was told by her OBGYN to get seen by her PCP to get her thyroid checked . Patient's level of thyroid were high.  Patient has an upcoming appointment with endocrinology in December but was told by OBGYN to don't wait until that appointment and have an appointment with her Doctor before to get checked.    We will call patient if there is a cancellation.

## 2023-01-15 NOTE — Telephone Encounter (Signed)
Patient has been scheduled

## 2023-01-19 ENCOUNTER — Encounter: Payer: Self-pay | Admitting: Internal Medicine

## 2023-01-19 ENCOUNTER — Ambulatory Visit: Payer: Self-pay | Admitting: Internal Medicine

## 2023-01-19 VITALS — BP 100/70 | HR 74 | Resp 16 | Ht 62.5 in | Wt 156.0 lb

## 2023-01-19 DIAGNOSIS — Z23 Encounter for immunization: Secondary | ICD-10-CM

## 2023-01-19 DIAGNOSIS — N920 Excessive and frequent menstruation with regular cycle: Secondary | ICD-10-CM

## 2023-01-19 DIAGNOSIS — R7303 Prediabetes: Secondary | ICD-10-CM

## 2023-01-19 DIAGNOSIS — E039 Hypothyroidism, unspecified: Secondary | ICD-10-CM

## 2023-01-19 MED ORDER — LEVOTHYROXINE SODIUM 25 MCG PO TABS: 25.0000 ug | ORAL_TABLET | Freq: Every day | ORAL | 11 refills | Status: DC

## 2023-01-19 NOTE — Progress Notes (Signed)
    Subjective:    Patient ID: Paula Villegas, female   DOB: Jul 17, 1975, 47 y.o.   MRN: [id number]   HPI   Subclinical hypothyroidism:  have been following her TSH and Free T4 since May of 2023, soon after she re estalished after 2.5 year hiatus during pandemic.  Upon returning for office visit, she was no longer taking Levothyroxine and had not been prescribed since 2020.  She was not aware she needed to be on levothyroxine likely for life and I did not recall her history at the time.  Her FT4 has been in normal range with TSH mildly elevated in 5+ range.  Prior to being lost to care, was taking 50 mcg of Levothyroxine.  Discussed possibly could affect her periods.     2.  Prediabetes:  A1C 6.2% in August.  he is trying to make lifestyle changes.  Has stopped drinking soda and decreased carbs with food.   Walks daily for 20 minutes daily.  Discussed thyroid could possibly affect this as well.  She was on levothyroxine and adequately replaced in 2020 when her A1C was 6.2% then as well.    3.  Menorrhagia and dysmenorrhea:  She feels she has tried BCPs and depo and is leaning toward another treatment to control the bleeding.   Current Meds  Medication Sig   albuterol (VENTOLIN HFA) 108 (90 Base) MCG/ACT inhaler Inhale 2 puffs into the lungs every 6 (six) hours as needed for wheezing or shortness of breath.   ibuprofen (ADVIL) 200 MG tablet Take 600 mg by mouth every 6 (six) hours as needed for moderate pain.   levocetirizine (XYZAL) 5 MG tablet Take 1 tablet (5 mg total) by mouth every evening.   olopatadine (PATANOL) 0.1 % ophthalmic solution Place 1 drop into both eyes 2 (two) times daily.   No Known Allergies   Review of Systems    Objective:   BP 100/70 (BP Location: Left Arm, Patient Position: Sitting, Cuff Size: Normal)   Pulse 74   Resp 16   Ht 5' 2.5" (1.588 m)   Wt 156 lb (70.8 kg)   BMI 28.08 kg/m   Physical Exam NAD Neck:  Supple, NO adenopathy.  +thyromegaly.   No definite nodules. Chest:  CTA CV:  RRR without murmur or rub.  Radial and DP pulses normal and equal.   Assessment & Plan   Subclinical hypothyroidism:  looking back, was previously treated with levothyroxine, but lost to care and did not get her restarted when re established.  Will start at 25 mcg daily and follow up TSH in 4 weeks.  2.  Prediabetes:  working on lifestyle again.  Asked her to work up to 60 minutes of walking daily.  3.  Abnormal Uterine Bleeding with normal endometrial biopsy and 3 small fibroids/dysmenorrhea:  she has FU with Dr. Stanford Scotland.  She feels hormones/IUD have not been helpful in past and not keen on the Aygestin.   Discussed need to have this conversation with Dr Lisabeth Pick.  Apparently given other options:  hysterectomy/endometrial ablation and encouraged her to discuss those as well. She has not applied for financial assistance with [facility] and encouraged her to do so.    4.  HM:  out of influenza today.  Due for Td in December--will go ahead with that today.  Spikevax/COVID.

## 2023-02-11 ENCOUNTER — Other Ambulatory Visit: Payer: Self-pay

## 2023-02-11 ENCOUNTER — Ambulatory Visit (INDEPENDENT_AMBULATORY_CARE_PROVIDER_SITE_OTHER): Payer: Self-pay | Admitting: Obstetrics and Gynecology

## 2023-02-11 VITALS — BP 120/71 | HR 69 | Wt 161.2 lb

## 2023-02-11 DIAGNOSIS — R102 Pelvic and perineal pain: Secondary | ICD-10-CM

## 2023-02-11 DIAGNOSIS — N939 Abnormal uterine and vaginal bleeding, unspecified: Secondary | ICD-10-CM

## 2023-02-11 DIAGNOSIS — Z1331 Encounter for screening for depression: Secondary | ICD-10-CM

## 2023-02-11 DIAGNOSIS — G8929 Other chronic pain: Secondary | ICD-10-CM

## 2023-02-11 NOTE — Progress Notes (Signed)
GYNECOLOGY VISIT  Patient name: Paula Villegas MRN [id number]  Date of birth: [birth year] Chief Complaint:   Menstrual Problem  History:  Paula Villegas is a 47 y.o. [id number] being seen today for AUB and dysmenorrhea followup.    Still bleeding, bleeding worse with medication. Medicaiton she takes for "tiredness" isn't helping any more. Tuesday having frequent urinatino  Haivng bleeding nearly continuously  Has been on thyroid medication for a month for now. 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  Past Medical History:  Diagnosis Date   Abnormal vaginal bleeding    Bradycardia    BV (bacterial vaginosis)    GERD (gastroesophageal reflux disease)    IUD migration    Ovarian cyst 2014   Prediabetes 07/2018   Preterm labor    PTL x2, PTD x1    Past Surgical History:  Procedure Laterality Date   APPENDECTOMY  2014   Laparoscopic   INTRAUTERINE DEVICE INSERTION  2015   ?copper--states good for 10 years   LAPAROSCOPIC CHOLECYSTECTOMY  2012    The following portions of the patient's history were reviewed and updated as appropriate: allergies, current medications, past family history, past medical history, past social history, past surgical history and problem list.   Health Maintenance:   Last pap Last Pap: 01/2022. Result was normal with negative HPV Last mammogram: 02/2022 BIR[facility] 1   Review of Systems:  Pertinent items are noted in HPI. Comprehensive review of systems was otherwise negative.   Objective:  Physical Exam BP 120/71   Pulse 69   Wt 161 lb 3.2 oz (73.1 kg)   BMI 29.01 kg/m    Physical Exam Vitals and nursing note reviewed. Exam conducted with a chaperone present.  Constitutional:      Appearance: Normal appearance.  HENT:     Head: Normocephalic and atraumatic.  Pulmonary:     Effort: Pulmonary effort is normal.     Breath sounds: Normal breath sounds.   Genitourinary:    General: Normal vulva.     Exam position: Lithotomy position.     Vagina: Normal.     Cervix: Normal.     Comments: Normal appearing vulva Normal vulvar sensation bilaterally Nontender superficial pelvic floor muscles Nontender ischial tuberosities bilaterally  Allodynia at introitus: Yes: 5-7 o'clock  Anal wink present Posterior vaginal wall tender Right levator ani 7/10 Right ischiococcygeous 7/10 Right obturator internus 10/10 Left levator ani 7/10 Left ischioccocygeous 10/10 Left obturator internus 10/10   Skin:    General: Skin is warm and dry.  Neurological:     General: No focal deficit present.     Mental Status: She is alert.  Psychiatric:        Mood and Affect: Mood normal.        Behavior: Behavior normal.        Thought Content: Thought content normal.        Judgment: Judgment normal.   EMB FINAL MICROSCOPIC DIAGNOSIS:   A. ENDOMETRIUM, BIOPSY:       Benign endometrium with inactive glands and stromal  pseudodecidualization compatible with exogenous progestin effect.       Negative for hyperplasia, atypia or malignancy.      Assessment & Plan:   1. Abnormal uterine bleeding (AUB) Patient interested in surgical management, but does not currently have insurance.  Encouraged to apply for financial aid program at [facility] health.  In the meantime  we will reach out to surgical scheduler regarding cost of endometrial ablation.  Discussed that endometrial which she can help with decreasing bleeding/flow until menopause have been reached.  Failed medical management therefore surgical management is within reason. Has benign EMB on file. In the interim will increase aygestin to 10mg  daily to achieve bleeding control in the interim. Given information re: ablation and hysterectomy  2. Chronic pelvic pain in female Patient noted to have pelvic floor myalgia - recommend PFPT. Discussed that [place] financial may be able to help cover cost of therapy.   - Ambulatory referral to Physical Therapy   Routine preventative health maintenance measures emphasized.  Lorriane Shire, MD Minimally Invasive Gynecologic Surgery Center for [facility], [facility]

## 2023-02-15 MED ORDER — NORETHINDRONE ACETATE 5 MG PO TABS
10.0000 mg | ORAL_TABLET | Freq: Every day | ORAL | 2 refills | Status: DC
Start: 2023-02-15 — End: 2023-05-03

## 2023-02-16 ENCOUNTER — Other Ambulatory Visit: Payer: Self-pay | Admitting: Obstetrics and Gynecology

## 2023-02-16 DIAGNOSIS — N939 Abnormal uterine and vaginal bleeding, unspecified: Secondary | ICD-10-CM

## 2023-02-17 ENCOUNTER — Other Ambulatory Visit: Payer: Self-pay

## 2023-02-17 DIAGNOSIS — E039 Hypothyroidism, unspecified: Secondary | ICD-10-CM

## 2023-02-18 LAB — TSH: TSH: 3.57 u[IU]/mL (ref 0.450–4.500)

## 2023-03-01 ENCOUNTER — Telehealth: Payer: Self-pay

## 2023-03-01 NOTE — Telephone Encounter (Signed)
Called patient to schedule surgery w/ Dr. Briscoe Deutscher. Left voicemail asking patient to call me at [phone].

## 2023-03-02 ENCOUNTER — Other Ambulatory Visit: Payer: Self-pay

## 2023-03-02 DIAGNOSIS — E039 Hypothyroidism, unspecified: Secondary | ICD-10-CM

## 2023-03-10 ENCOUNTER — Other Ambulatory Visit: Payer: Self-pay

## 2023-03-15 ENCOUNTER — Ambulatory Visit: Payer: Self-pay | Admitting: "Endocrinology

## 2023-03-25 ENCOUNTER — Encounter (HOSPITAL_COMMUNITY): Payer: Self-pay | Admitting: Obstetrics and Gynecology

## 2023-03-25 ENCOUNTER — Other Ambulatory Visit: Payer: Self-pay

## 2023-03-25 NOTE — Progress Notes (Signed)
 Patient speaks English and Spanish.  Information and Instructions for DOS were reviewed with patient via phone in English.    PCP - Dr Almarie Bolds Cardiologist - none  Chest x-ray - 08/02/18 EKG - 05/25/18 Stress Test - n/a ECHO - n/a Cardiac Cath - n/a  ICD Pacemaker/Loop - n/a  Sleep Study -  n/a CPAP - none  Diabetes - n/a  NPO   STOP now taking any Aspirin (unless otherwise instructed by your surgeon), Aleve, Naproxen, Ibuprofen , Motrin , Advil , Goody's, BC's, all herbal medications, fish oil, and all vitamins.   Coronavirus Screening Do you have any of the following symptoms:  Cough yes/no: No Fever (>100.51F)  yes/no: No Runny nose yes/no: No Sore throat yes/no: No Difficulty breathing/shortness of breath  yes/no: No  Have you traveled in the last 14 days and where? yes/no: No  Patient verbalized understanding of instructions that were given via phone.

## 2023-03-28 NOTE — Anesthesia Preprocedure Evaluation (Addendum)
 Anesthesia Evaluation  Patient identified by MRN, date of birth, ID band Patient awake    Reviewed: Allergy & Precautions, NPO status , Patient's Chart, lab work & pertinent test results  Airway Mallampati: III  TM Distance: >3 FB Neck ROM: Full    Dental no notable dental hx. (+) Teeth Intact, Dental Advisory Given   Pulmonary neg pulmonary ROS   Pulmonary exam normal breath sounds clear to auscultation       Cardiovascular negative cardio ROS Normal cardiovascular exam Rhythm:Regular Rate:Normal     Neuro/Psych negative neurological ROS  negative psych ROS   GI/Hepatic negative GI ROS, Neg liver ROS,,,  Endo/Other  negative endocrine ROSHypothyroidism    Renal/GU negative Renal ROS  negative genitourinary   Musculoskeletal negative musculoskeletal ROS (+)    Abdominal   Peds  Hematology  (+) Blood dyscrasia, anemia Lab Results      Component                Value               Date                      WBC                      7.1                 03/29/2023                HGB                      9.8 (L)             03/29/2023                HCT                      32.9 (L)            03/29/2023                MCV                      75.5 (L)            03/29/2023                PLT                      454 (H)             03/29/2023              Anesthesia Other Findings   Reproductive/Obstetrics                             Anesthesia Physical Anesthesia Plan  ASA: 2  Anesthesia Plan: General   Post-op Pain Management: Tylenol  PO (pre-op)*   Induction: Intravenous  PONV Risk Score and Plan: 3 and Ondansetron , Dexamethasone  and Midazolam   Airway Management Planned: LMA  Additional Equipment:   Intra-op Plan:   Post-operative Plan: Extubation in OR  Informed Consent: I have reviewed the patients History and Physical, chart, labs and discussed the procedure including the  risks, benefits and alternatives for the proposed anesthesia with the patient or authorized representative who has indicated his/her understanding and acceptance.     Dental  advisory given  Plan Discussed with: CRNA  Anesthesia Plan Comments:        Anesthesia Quick Evaluation

## 2023-03-29 ENCOUNTER — Encounter (HOSPITAL_COMMUNITY)
Admission: RE | Disposition: A | Discharge status: Home / Self Care | Payer: Self-pay | Source: Ambulatory Visit | Attending: Obstetrics and Gynecology

## 2023-03-29 ENCOUNTER — Encounter (HOSPITAL_COMMUNITY): Payer: Self-pay | Admitting: Obstetrics and Gynecology

## 2023-03-29 ENCOUNTER — Ambulatory Visit (HOSPITAL_BASED_OUTPATIENT_CLINIC_OR_DEPARTMENT_OTHER): Payer: Self-pay | Admitting: Anesthesiology

## 2023-03-29 ENCOUNTER — Ambulatory Visit (HOSPITAL_COMMUNITY)
Admission: RE | Admit: 2023-03-29 | Discharge: 2023-03-29 | Disposition: A | Discharge status: Home / Self Care | Payer: Self-pay | Source: Ambulatory Visit | Attending: Obstetrics and Gynecology | Admitting: Obstetrics and Gynecology

## 2023-03-29 ENCOUNTER — Other Ambulatory Visit: Payer: Self-pay

## 2023-03-29 ENCOUNTER — Ambulatory Visit (HOSPITAL_COMMUNITY): Payer: Self-pay | Admitting: Anesthesiology

## 2023-03-29 DIAGNOSIS — N920 Excessive and frequent menstruation with regular cycle: Secondary | ICD-10-CM

## 2023-03-29 DIAGNOSIS — D250 Submucous leiomyoma of uterus: Secondary | ICD-10-CM

## 2023-03-29 DIAGNOSIS — N8003 Adenomyosis of the uterus: Secondary | ICD-10-CM | POA: Insufficient documentation

## 2023-03-29 DIAGNOSIS — D219 Benign neoplasm of connective and other soft tissue, unspecified: Secondary | ICD-10-CM

## 2023-03-29 DIAGNOSIS — N840 Polyp of corpus uteri: Secondary | ICD-10-CM

## 2023-03-29 DIAGNOSIS — N938 Other specified abnormal uterine and vaginal bleeding: Secondary | ICD-10-CM

## 2023-03-29 DIAGNOSIS — D259 Leiomyoma of uterus, unspecified: Secondary | ICD-10-CM | POA: Insufficient documentation

## 2023-03-29 DIAGNOSIS — N939 Abnormal uterine and vaginal bleeding, unspecified: Secondary | ICD-10-CM

## 2023-03-29 HISTORY — DX: Anemia, unspecified: D64.9

## 2023-03-29 HISTORY — DX: Hypothyroidism, unspecified: E03.9

## 2023-03-29 HISTORY — PX: DILITATION & CURRETTAGE/HYSTROSCOPY WITH NOVASURE ABLATION: SHX5568

## 2023-03-29 HISTORY — DX: Pneumonia, unspecified organism: J18.9

## 2023-03-29 HISTORY — DX: Prediabetes: R73.03

## 2023-03-29 LAB — CBC
HCT: 32.9 % — ABNORMAL LOW (ref 36.0–46.0)
Hemoglobin: 9.8 g/dL — ABNORMAL LOW (ref 12.0–15.0)
MCH: 22.5 pg — ABNORMAL LOW (ref 26.0–34.0)
MCHC: 29.8 g/dL — ABNORMAL LOW (ref 30.0–36.0)
MCV: 75.5 fL — ABNORMAL LOW (ref 80.0–100.0)
Platelets: 454 10*3/uL — ABNORMAL HIGH (ref 150–400)
RBC: 4.36 MIL/uL (ref 3.87–5.11)
RDW: 16.3 % — ABNORMAL HIGH (ref 11.5–15.5)
WBC: 7.1 10*3/uL (ref 4.0–10.5)
nRBC: 0 % (ref 0.0–0.2)

## 2023-03-29 LAB — POCT PREGNANCY, URINE: Preg Test, Ur: NEGATIVE

## 2023-03-29 SURGERY — DILATATION & CURETTAGE/HYSTEROSCOPY WITH NOVASURE ABLATION
Anesthesia: General | Site: Uterus

## 2023-03-29 MED ORDER — ACETAMINOPHEN 500 MG PO TABS
1000.0000 mg | ORAL_TABLET | ORAL | Status: AC
  Administered 2023-03-29: 1000 mg via ORAL
  Filled 2023-03-29: qty 2

## 2023-03-29 MED ORDER — FENTANYL CITRATE (PF) 250 MCG/5ML IJ SOLN
INTRAMUSCULAR | Status: AC
  Filled 2023-03-29: qty 5

## 2023-03-29 MED ORDER — PHENYLEPHRINE 80 MCG/ML (10ML) SYRINGE FOR IV PUSH (FOR BLOOD PRESSURE SUPPORT)
PREFILLED_SYRINGE | INTRAVENOUS | Status: DC | PRN
  Administered 2023-03-29 (×2): 80 ug via INTRAVENOUS
  Administered 2023-03-29: 160 ug via INTRAVENOUS

## 2023-03-29 MED ORDER — LIDOCAINE 2% (20 MG/ML) 5 ML SYRINGE
INTRAMUSCULAR | Status: DC | PRN
  Administered 2023-03-29: 60 mg via INTRAVENOUS

## 2023-03-29 MED ORDER — LIDOCAINE 2% (20 MG/ML) 5 ML SYRINGE
INTRAMUSCULAR | Status: AC
  Filled 2023-03-29: qty 5

## 2023-03-29 MED ORDER — DEXAMETHASONE SODIUM PHOSPHATE 10 MG/ML IJ SOLN
INTRAMUSCULAR | Status: DC | PRN
  Administered 2023-03-29: 10 mg via INTRAVENOUS

## 2023-03-29 MED ORDER — FENTANYL CITRATE (PF) 250 MCG/5ML IJ SOLN
INTRAMUSCULAR | Status: DC | PRN
  Administered 2023-03-29: 50 ug via INTRAVENOUS

## 2023-03-29 MED ORDER — BUPIVACAINE HCL (PF) 0.5 % IJ SOLN
INTRAMUSCULAR | Status: DC | PRN
  Administered 2023-03-29: 20 mL

## 2023-03-29 MED ORDER — ONDANSETRON HCL 4 MG/2ML IJ SOLN
INTRAMUSCULAR | Status: AC
  Filled 2023-03-29: qty 2

## 2023-03-29 MED ORDER — CHLORHEXIDINE GLUCONATE 0.12 % MT SOLN
15.0000 mL | Freq: Once | OROMUCOSAL | Status: AC
Start: 2023-03-29 — End: 2023-03-29
  Administered 2023-03-29: 15 mL via OROMUCOSAL
  Filled 2023-03-29: qty 15

## 2023-03-29 MED ORDER — FENTANYL CITRATE (PF) 100 MCG/2ML IJ SOLN
INTRAMUSCULAR | Status: AC
  Filled 2023-03-29: qty 2

## 2023-03-29 MED ORDER — SODIUM CHLORIDE 0.9 % IR SOLN
Status: DC | PRN
  Administered 2023-03-29: 1000 mL

## 2023-03-29 MED ORDER — LACTATED RINGERS IV SOLN: INTRAVENOUS | Status: DC

## 2023-03-29 MED ORDER — PROPOFOL 10 MG/ML IV BOLUS
INTRAVENOUS | Status: AC
  Filled 2023-03-29: qty 20

## 2023-03-29 MED ORDER — SODIUM CHLORIDE 0.9 % IV SOLN: INTRAVENOUS | Status: DC | PRN

## 2023-03-29 MED ORDER — ORAL CARE MOUTH RINSE: 15.0000 mL | Freq: Once | OROMUCOSAL | Status: AC

## 2023-03-29 MED ORDER — ONDANSETRON HCL 4 MG/2ML IJ SOLN
INTRAMUSCULAR | Status: DC | PRN
  Administered 2023-03-29: 4 mg via INTRAVENOUS

## 2023-03-29 MED ORDER — MIDAZOLAM HCL 2 MG/2ML IJ SOLN
INTRAMUSCULAR | Status: DC | PRN
  Administered 2023-03-29: 2 mg via INTRAVENOUS

## 2023-03-29 MED ORDER — DEXAMETHASONE SODIUM PHOSPHATE 10 MG/ML IJ SOLN
INTRAMUSCULAR | Status: AC
  Filled 2023-03-29: qty 1

## 2023-03-29 MED ORDER — KETOROLAC TROMETHAMINE 30 MG/ML IJ SOLN
INTRAMUSCULAR | Status: DC | PRN
  Administered 2023-03-29: 30 mg via INTRAVENOUS

## 2023-03-29 MED ORDER — FENTANYL CITRATE (PF) 100 MCG/2ML IJ SOLN
25.0000 ug | INTRAMUSCULAR | Status: DC | PRN
  Administered 2023-03-29: 25 ug via INTRAVENOUS

## 2023-03-29 MED ORDER — ACETAMINOPHEN 500 MG PO TABS: 1000.0000 mg | ORAL_TABLET | Freq: Once | ORAL | Status: DC

## 2023-03-29 MED ORDER — PROPOFOL 10 MG/ML IV BOLUS
INTRAVENOUS | Status: DC | PRN
  Administered 2023-03-29: 50 mg via INTRAVENOUS
  Administered 2023-03-29: 150 mg via INTRAVENOUS

## 2023-03-29 MED ORDER — MIDAZOLAM HCL 2 MG/2ML IJ SOLN
INTRAMUSCULAR | Status: AC
Start: 2023-03-29 — End: ?
  Filled 2023-03-29: qty 2

## 2023-03-29 SURGICAL SUPPLY — 20 items
ABLATOR SURESOUND NOVASURE (ABLATOR) IMPLANT
CATH ROBINSON RED A/P 16FR (CATHETERS) ×2 IMPLANT
DEVICE MYOSURE LITE (MISCELLANEOUS) IMPLANT
DEVICE MYOSURE REACH (MISCELLANEOUS) IMPLANT
DRSG TELFA 3X8 NADH STRL (GAUZE/BANDAGES/DRESSINGS) ×2 IMPLANT
GAUZE 4X4 16PLY ~~LOC~~+RFID DBL (SPONGE) ×2 IMPLANT
GLOVE BIO SURGEON STRL SZ7 (GLOVE) ×2 IMPLANT
GLOVE BIOGEL PI IND STRL 7.0 (GLOVE) IMPLANT
GOWN STRL REUS W/TWL LRG LVL3 (GOWN DISPOSABLE) IMPLANT
GOWN STRL REUS W/TWL XL LVL3 (GOWN DISPOSABLE) ×2 IMPLANT
KIT PROCEDURE FLUENT (KITS) ×2 IMPLANT
KIT TURNOVER CYSTO (KITS) ×2 IMPLANT
LOOP CUTTING BIPOLAR 21FR (ELECTRODE) IMPLANT
MYOSURE XL FIBROID (MISCELLANEOUS) ×1 IMPLANT
PACK VAGINAL MINOR WOMEN LF (CUSTOM PROCEDURE TRAY) ×2 IMPLANT
PAD OB MATERNITY 4.3X12.25 (PERSONAL CARE ITEMS) ×2 IMPLANT
SEAL CERVICAL OMNI LOK (ABLATOR) IMPLANT
SEAL ROD LENS SCOPE MYOSURE (ABLATOR) ×2 IMPLANT
SLEEVE SCD COMPRESS KNEE MED (STOCKING) ×2 IMPLANT
SYSTEM TISS REMOVAL MYOSURE XL (MISCELLANEOUS) IMPLANT

## 2023-03-29 NOTE — Op Note (Signed)
 Paula Villegas PROCEDURE DATE: 03/29/2023  PREOPERATIVE DIAGNOSIS: abnormal uterine bleeding, fibroids POSTOPERATIVE DIAGNOSIS: abnormal uterine bleeding, fibroids PROCEDURE:    hysteroscopic myomectomy, dilation & curettage, and Novasure ablation SURGEON: Carter Quarry, MD ASSISTANT:  none  INDICATIONS: 48 y.o. [id number] with AUB-L.  Risks of surgery were discussed with the patient including but not limited to: bleeding which may require transfusion; infection which may require antibiotics; injury to surrounding organs; need for additional procedures including laparotomy;  and other postoperative/anesthesia complications. Written informed consent was obtained.    FINDINGS:  Normal external genitalia, normal appearing cervix with polypoid lesion at external os *Hysteroscopically: bilateral tubal ostia, submucosal fibroid ~2 cm from right anterior wall  ANESTHESIA: General, paracervial block INTRAVENOUS FLUIDS:  150 ml of LR ESTIMATED BLOOD LOSS:  10 ml URINE OUTPUT: n/a SPECIMENS: endometrial curettings and fibroid, cervical polyp COMPLICATIONS:  None immediate.  FLUID DEFICIT: 220 cc normal saline  PROCEDURE DETAILS:  The patient was taken to the operating room where general anesthesia was administered and was found to be adequate.  After an adequate timeout was performed, she was placed in the dorsal lithotomy position and examined; then prepped and draped in the sterile manner.  A speculum was then placed in the patient's vagina and a single tooth tenaculum was applied to the anterior lip of the cervix.  A paracervical block using 0.5% Marcaine  was administered.  Polypoid tissue from the cervix was removed bluntly with polyp forceps. The cervix was dilated manually with Hegar dilators to accommodate the hysteroscope.  Once the cervix was dilated, the hysteroscope was inserted under direct visualization.  The findings above were noted. The uterine cavity was carefully examined, both  ostia were recognized. The Myosure XL was used to resect the fibroid. The hysteroscope was then removed.  The included sound was used to measure the cervical length and cavity length, which were 5.5 cm and 4.3cm, respectrively.  The NovaSure device was inserted, and a cavity width of 3.5 cm was determined. Using a power of 60 watts, for 1:11 sec, the endometrial ablation was performed. The hysteroscope was then re-introduced into the uterine cavity, confirming 95% ablation of the endometrium. The tenaculum was removed from the anterior lip of the cervix, and the vaginal speculum was removed after noting good hemostasis.  The patient tolerated the procedure well and was taken to the recovery area awake, extubated and in stable condition.  The patient will be discharged to home as per PACU criteria.  Routine postoperative instructions given.    She will follow up in the clinic on 4 for postoperative evaluation.    Carter Quarry, MD Minimally Invasive Gynecologic Surgery  Obstetrics and Gynecology, [facility] for [facility], [facility] 03/29/2023

## 2023-03-29 NOTE — H&P (Signed)
 OB/GYN Pre-Op History and Physical  Paula Villegas is a 48 y.o. [id number] presenting for scheduled operative HSC, D&C, and novasure ablation.       Past Medical History:  Diagnosis Date   Abnormal vaginal bleeding    Anemia    Bradycardia    BV (bacterial vaginosis)    COVID 2020   Hypothyroidism    IUD migration    Ovarian cyst 2014   Pneumonia    2020   Pre-diabetes    no meds, diet controllled   Prediabetes 07/2018   Preterm labor    PTL x2, PTD x1    Past Surgical History:  Procedure Laterality Date   APPENDECTOMY  2014   Laparoscopic   INTRAUTERINE DEVICE INSERTION  2015   ?copper --states good for 10 years   LAPAROSCOPIC CHOLECYSTECTOMY  2012    OB History  Gravida Para Term Preterm AB Living  5 4 3 1 1 4   SAB IAB Ectopic Multiple Live Births  1    4    # Outcome Date GA Lbr Len/2nd Weight Sex Type Anes PTL Lv  5 Term 05/18/13 [redacted]w[redacted]d 01:30 / 00:23 3708 g F Vag-Spont None  LIV  4 SAB 2006          3 Term 11/18/01 [redacted]w[redacted]d  3600 g F Vag-Spont   LIV     Birth Comments: No complications   2 Preterm 1998 [redacted]w[redacted]d  2240 g M Vag-Spont   LIV     Birth Comments: PTD, PTL; Delivered in [place]   1 Term 1994 [redacted]w[redacted]d  2892 g F Vag-Spont   LIV     Birth Comments: No complications.     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 In [place]   Lives with middle and youngest daughter  live with her now.  .   Father of children lives in [place], they are no longer a couple.   Older children live on their own now.   Works in emergency planning/management officer at [employer], but also therapist, sports.   Social Drivers of Corporate Investment Banker Strain: Low Risk  (12/26/2018)   Overall Financial Resource Strain (CARDIA)    Difficulty of Paying Living Expenses: Not very hard  Food Insecurity: No Food Insecurity (04/29/2022)   Hunger Vital Sign    Worried About Running Out of Food in the Last Year: Never true    Ran Out of Food in the Last Year: Never true  Transportation Needs: No Transportation Needs (07/06/2017)   PRAPARE - Administrator, Civil Service (Medical): No    Lack of Transportation (Non-Medical): No  Physical Activity: Inactive (12/26/2018)   Exercise Vital Sign    Days of Exercise per Week: 0 days    Minutes of  Exercise per Session: 0 min  Stress: No Stress Concern Present (12/26/2018)   [employer] of Occupational Health - Occupational Stress Questionnaire    Feeling of Stress : Only a little  Social Connections: Unknown (11/11/2021)   Received from [facility]   Social Network    Social Network: Not on file    Family History  Problem Relation Age of Onset   Hypertension Mother    Cancer Mother 74       cervical   Heart disease Mother        Died today, 08-04-17 when patient in for CPE--MI related to alcohol abuse.   Asthma Brother    Diabetes Brother    Diabetes Father    Alcohol abuse Father    Heart disease Father    Seizures Brother 34       post traumatic brain injury --pedestrian hit by car   Hearing loss Neg Hx     Medications Prior to Admission  Medication Sig Dispense Refill Last Dose/Taking   ibuprofen  (ADVIL ) 200 MG tablet Take 600 mg by mouth every 6 (six) hours as needed for moderate pain.   03/27/2023   levothyroxine  (SYNTHROID ) 25 MCG tablet Take 1 tablet (25 mcg total) by mouth daily. 30 tablet 11  03/28/2023   norethindrone  (AYGESTIN ) 5 MG tablet Take 2 tablets (10 mg total) by mouth daily. (Patient taking differently: Take 5 mg by mouth at bedtime.) 60 tablet 2 03/27/2023   albuterol  (VENTOLIN  HFA) 108 (90 Base) MCG/ACT inhaler Inhale 2 puffs into the lungs every 6 (six) hours as needed for wheezing or shortness of breath. 8.5 g 1 More than a month   levocetirizine (XYZAL ) 5 MG tablet Take 1 tablet (5 mg total) by mouth every evening. (Patient taking differently: Take 5 mg by mouth daily as needed for allergies.) 30 tablet 11 More than a month   olopatadine  (PATANOL) 0.1 % ophthalmic solution Place 1 drop into both eyes 2 (two) times daily. (Patient taking differently: Place 1 drop into both eyes 2 (two) times daily as needed for allergies.) 5 mL 11 More than a month    No Known Allergies  Review of Systems: Negative except for what is mentioned in HPI.     Physical Exam: BP 121/74 (BP Location: Right Arm)   Pulse 70   Temp (!) 97.3 F (36.3 C) (Skin)   Resp 18   Ht 5' 2.5 (1.588 m)   Wt 70.8 kg   LMP  (LMP Unknown) Comment: September 05, 2021 possible LMP, patient unsure  SpO2 99%   BMI 28.09 kg/m  CONSTITUTIONAL: Well-developed, well-nourished and in no acute distress.  HENT:  Normocephalic, atraumatic, External right and left ear normal.  EYES: Conjunctivae and EOM are normal. Pupils are equal, round, and reactive to light. No scleral icterus.  NECK: Normal range of motion, supple, no masses SKIN: Skin is warm and dry. No rash noted. Not diaphoretic. No erythema. No pallor. NEUROLGIC: Alert and oriented to person, place, and time. Normal reflexes, muscle tone coordination. No cranial nerve deficit noted. PSYCHIATRIC: Normal mood and affect. Normal behavior. Normal judgment and thought content. RESPIRATORY: Normal effort PELVIC: Deferred   Pertinent Labs/Studies:   No results found for this or any previous visit (from the past 72 hours).     Assessment and Plan :Paula C  Villegas is a 48 y.o. H4E6885 here for hysteroscopy and endometrial ablation.   Patient desires surgical management with AUB.  The risks of surgery  were discussed in detail with the patient including but not limited to: bleeding which may require transfusion or reoperation; infection which may require prolonged hospitalization or re-hospitalization and antibiotic therapy; injury to bowel, bladder, ureters and major vessels or other surrounding organs which may lead to other procedures; formation of adhesions; need for additional procedures including laparotomy or subsequent procedures secondary to intraoperative injury or abnormal pathology; thromboembolic phenomenon; incisional problems and other postoperative or anesthesia complications.  Patient was told that the likelihood that her condition and symptoms will be treated effectively with this surgical management was moderate to high; the postoperative expectations were also discussed in detail. The patient also understands the alternative treatment options which were discussed in full. All questions were answered.   Printed patient education handouts about the procedure were given to the patient to review at home.    Azalie Harbeck, M.D. Minimally Invasive Gynecologic Surgery and Pelvic Pain Specialist Attending Obstetrician & Gynecologist, [facility] for [employer], [facility]

## 2023-03-29 NOTE — Discharge Instructions (Addendum)
 Continue aygestin  for the next 4 weeks  Post-surgical Instructions, Outpatient Surgery  You may expect to feel dizzy, weak, and drowsy for as long as 24 hours after receiving the medicine that made you sleep (anesthetic). For the first 24 hours after your surgery:   Do not drive a car, ride a bicycle, participate in physical activities, or take public transportation until you are done taking narcotic pain medicines or as directed by Dr. Jeralyn.  Do not drink alcohol or take tranquilizers.  Do not take medicine that has not been prescribed by your physicians.  Do not sign important papers or make important decisions while on narcotic pain medicines.  Have a responsible person with you.   PAIN MANAGEMENT Ibuprofen  800mg .  (This is the same as 4-200mg  over the counter tablets of Motrin  or ibuprofen .)  Take this every 6 hours or as needed for cramping.   Acetaminophen  1000mg  (This is the same as 2-500mg  over the counter extra strength tylenol ). Take this every 6 hours for the first 3 days or as needed afterwards for pain Oxycodone  5mg  For more severe pain, take one or two tablets every four to six hours as needed for pain control.  (Remember that narcotic pain medications increase your risk of constipation.  If this becomes a problem, you may take an over the counter laxative like miralax .)  DO'S AND DON'T'S Do not take a tub bath for 4 weeks.  You may shower on the first day after your surgery Do not do any heavy lifting for one to two weeks.  This increases the chance of bleeding. Do move around as you feel able.  Stairs are fine.  You may begin to exercise again as you feel able.  Do not lift any weights for two weeks. Do not put anything in the vagina for two weeks--no tampons, intercourse, or douching.    REGULAR MEDIATIONS/VITAMINS: You may restart all of your regular medications as prescribed. You may restart all of your vitamins as you normally take them.    PLEASE CALL OR SEEK MEDICAL  CARE IF: You have persistent nausea and vomiting.  You have trouble eating or drinking.  You have an oral temperature above 100.5.  You have constipation that is not helped by adjusting diet or increasing fluid intake. Pain medicines are a common cause of constipation.  You have heavy vaginal bleeding You have redness or drainage from your incision(s) or there is increasing pain or tenderness near or in the surgical site.

## 2023-03-29 NOTE — Brief Op Note (Signed)
 03/29/2023  11:25 AM  PATIENT:  Paula Villegas  48 y.o. female  PRE-OPERATIVE DIAGNOSIS:  anbnormal uterine bleeding  POST-OPERATIVE DIAGNOSIS:  anbnormal uterine bleeding  PROCEDURE:  Procedure(s): DILATATION & CURETTAGE/HYSTEROSCOPY AND MYOMECTOMY WITH NOVASURE ABLATION (N/A)  SURGEON:  Surgeons and Role:    DEWAINE Jeralyn Crutch, MD - Primary  PHYSICIAN ASSISTANT: n/a  ASSISTANTS: none   ANESTHESIA:   general and paracervical block  EBL:  10 mL   BLOOD ADMINISTERED:none  DRAINS: none   LOCAL MEDICATIONS USED:  BUPIVICAINE   SPECIMEN:  Source of Specimen:  cervical polyp, endometrial curetting with fibroid  DISPOSITION OF SPECIMEN:  PATHOLOGY  COUNTS:  YES  TOURNIQUET:  * No tourniquets in log *  DICTATION: .Note written in EPIC  PLAN OF CARE: Discharge to home after PACU  PATIENT DISPOSITION:  PACU - hemodynamically stable.   Delay start of Pharmacological VTE agent (>24hrs) due to surgical blood loss or risk of bleeding: not applicable

## 2023-03-29 NOTE — Anesthesia Procedure Notes (Signed)
 Procedure Name: LMA Insertion Date/Time: 03/29/2023 10:54 AM  Performed by: Jolynn Mage, CRNAPre-anesthesia Checklist: Patient identified, Emergency Drugs available, Suction available and Patient being monitored Patient Re-evaluated:Patient Re-evaluated prior to induction Oxygen Delivery Method: Circle system utilized Preoxygenation: Pre-oxygenation with 100% oxygen Induction Type: IV induction Ventilation: Mask ventilation without difficulty LMA: LMA flexible inserted LMA Size: 4.0 Number of attempts: 1 Placement Confirmation: positive ETCO2 and breath sounds checked- equal and bilateral Tube secured with: Tape Dental Injury: Teeth and Oropharynx as per pre-operative assessment

## 2023-03-29 NOTE — Transfer of Care (Signed)
 Immediate Anesthesia Transfer of Care Note  Patient: Paula Villegas  Procedure(s) Performed: DILATATION & CURETTAGE/HYSTEROSCOPY AND MYOMECTOMY WITH NOVASURE ABLATION (Uterus)  Patient Location: PACU  Anesthesia Type:General  Level of Consciousness: awake, patient cooperative, and responds to stimulation  Airway & Oxygen Therapy: Patient Spontanous Breathing and Patient connected to face mask oxygen  Post-op Assessment: Report given to RN, Post -op Vital signs reviewed and stable, and Patient moving all extremities X 4  Post vital signs: Reviewed and stable  Last Vitals:  Vitals Value Taken Time  BP 108/57 03/29/23 1132  Temp    Pulse 65 03/29/23 1141  Resp 17 03/29/23 1141  SpO2 100 % 03/29/23 1141  Vitals shown include unfiled device data.  Last Pain:  Vitals:   03/29/23 0859  TempSrc:   PainSc: 4          Complications: No notable events documented.

## 2023-03-30 ENCOUNTER — Encounter (HOSPITAL_COMMUNITY): Payer: Self-pay | Admitting: Obstetrics and Gynecology

## 2023-03-30 LAB — SURGICAL PATHOLOGY

## 2023-03-30 NOTE — Anesthesia Postprocedure Evaluation (Signed)
 Anesthesia Post Note  Patient: Paula Villegas  Procedure(s) Performed: DILATATION & CURETTAGE/HYSTEROSCOPY AND MYOMECTOMY WITH NOVASURE ABLATION (Uterus)     Patient location during evaluation: PACU Anesthesia Type: General Level of consciousness: awake and alert Pain management: pain level controlled Vital Signs Assessment: post-procedure vital signs reviewed and stable Respiratory status: spontaneous breathing, nonlabored ventilation, respiratory function stable and patient connected to nasal cannula oxygen Cardiovascular status: blood pressure returned to baseline and stable Postop Assessment: no apparent nausea or vomiting Anesthetic complications: no  No notable events documented.  Last Vitals:  Vitals:   03/29/23 1215 03/29/23 1230  BP: 115/70   Pulse: (!) 55   Resp: 12   Temp:  (!) 36.4 C  SpO2: 99%     Last Pain:  Vitals:   03/29/23 1230  TempSrc:   PainSc: Asleep                 Nolin Grell L Alaney Witter

## 2023-04-12 ENCOUNTER — Encounter: Payer: Self-pay | Admitting: Internal Medicine

## 2023-04-26 ENCOUNTER — Ambulatory Visit: Payer: Self-pay | Admitting: Obstetrics and Gynecology

## 2023-05-03 ENCOUNTER — Ambulatory Visit: Payer: Self-pay | Admitting: Internal Medicine

## 2023-05-03 ENCOUNTER — Encounter: Payer: Self-pay | Admitting: Internal Medicine

## 2023-05-03 VITALS — BP 100/64 | HR 63 | Resp 16 | Ht 62.5 in | Wt 161.0 lb

## 2023-05-03 DIAGNOSIS — Z9189 Other specified personal risk factors, not elsewhere classified: Secondary | ICD-10-CM

## 2023-05-03 DIAGNOSIS — E049 Nontoxic goiter, unspecified: Secondary | ICD-10-CM

## 2023-05-03 DIAGNOSIS — Z1159 Encounter for screening for other viral diseases: Secondary | ICD-10-CM

## 2023-05-03 DIAGNOSIS — D649 Anemia, unspecified: Secondary | ICD-10-CM

## 2023-05-03 DIAGNOSIS — Z1231 Encounter for screening mammogram for malignant neoplasm of breast: Secondary | ICD-10-CM

## 2023-05-03 DIAGNOSIS — E039 Hypothyroidism, unspecified: Secondary | ICD-10-CM

## 2023-05-03 DIAGNOSIS — Z0120 Encounter for dental examination and cleaning without abnormal findings: Secondary | ICD-10-CM | POA: Insufficient documentation

## 2023-05-03 DIAGNOSIS — Z23 Encounter for immunization: Secondary | ICD-10-CM

## 2023-05-03 DIAGNOSIS — R7303 Prediabetes: Secondary | ICD-10-CM

## 2023-05-03 DIAGNOSIS — H547 Unspecified visual loss: Secondary | ICD-10-CM

## 2023-05-03 DIAGNOSIS — Z0000 Encounter for general adult medical examination without abnormal findings: Secondary | ICD-10-CM

## 2023-05-03 NOTE — Progress Notes (Signed)
 Subjective:    Patient ID: Paula Villegas, female   DOB: [birth year], 48 y.o.   MRN: [id number]   HPI  CPE without pap  1.  Pap:  Last was 02/16/2022 and benign reparative changes.    2.  Mammogram:  Last 02/2022 for breast pain and normal.  No family history of breast cancer.    3.  Osteoprevention:   Drinks almond milk twice daily.  Willing to increase to 3-4 servings.  Walking with dog daily 20-30 minutes.    4.  Guaiac Cards/FIT: Last 07/2021 and negative for blood.    5.  Colonoscopy:  Never.  No family history of colon cancer.    6.  Immunizations:  Has not had influenza vaccine this year.  Felt really ill after an influenza vaccine Immunization History  Administered Date(s) Administered   Moderna Covid-19 Fall Seasonal Vaccine 3yrs & older 04/29/2022, 01/19/2023   Moderna Covid-19 Vaccine  Bivalent Booster 1yrs & up 07/24/2021   PFIZER Comirnaty(Gray Top)Covid-19 Tri-Sucrose Vaccine 05/11/2020, 06/06/2020   Td 12/13/2001, 01/19/2023   Tdap 03/13/2013     7.  Glucose/Cholesterol:  Prediabetes with A1C at 6.2% and low HDL in August of 2024.  She also had mild hypothyroidism then as well.      Current Meds  Medication Sig   albuterol  (VENTOLIN  HFA) 108 (90 Base) MCG/ACT inhaler Inhale 2 puffs into the lungs every 6 (six) hours as needed for wheezing or shortness of breath.   ibuprofen  (ADVIL ) 200 MG tablet Take 600 mg by mouth every 6 (six) hours as needed for moderate pain.   levocetirizine (XYZAL ) 5 MG tablet Take 1 tablet (5 mg total) by mouth every evening. (Patient taking differently: Take 5 mg by mouth daily as needed for allergies.)   levothyroxine  (SYNTHROID ) 25 MCG tablet Take 1 tablet (25 mcg total) by mouth daily.   olopatadine  (PATANOL) 0.1 % ophthalmic solution Place 1 drop into both eyes 2 (two) times daily. (Patient taking differently: Place 1 drop into both eyes 2 (two) times daily as needed for allergies.)   No Known Allergies   Review of  Systems  HENT:  Positive for dental problem (Filling coming out of left lower molar.).   Eyes:  Positive for visual disturbance (blurry with close up).  Respiratory:  Negative for shortness of breath.   Cardiovascular:  Negative for chest pain, palpitations and leg swelling.  Gastrointestinal:  Negative for blood in stool (no melena).  Genitourinary:        No vaginal bleeding since myomectomy/D & C and endometrial ablation last month with gynecology      Objective:   BP 100/64 (BP Location: Left Arm, Patient Position: Sitting, Cuff Size: Normal)   Pulse 63   Resp 16   Ht 5' 2.5" (1.588 m)   Wt 161 lb (73 kg)   LMP 03/29/2023   BMI 28.98 kg/m   Physical Exam HENT:     Head: Normocephalic and atraumatic.     Right Ear: Tympanic membrane, ear canal and external ear normal.     Left Ear: Tympanic membrane, ear canal and external ear normal.     Nose: Nose normal.     Mouth/Throat:     Mouth: Mucous membranes are moist.     Pharynx: Oropharynx is clear.     Comments: Left lower molar with unfilled central area Eyes:     Extraocular Movements: Extraocular movements intact.     Conjunctiva/sclera: Conjunctivae normal.     Pupils:  Pupils are equal, round, and reactive to light.     Comments: Discs sharp  Neck:     Thyroid : Thyromegaly (nodular) present.  Cardiovascular:     Rate and Rhythm: Normal rate and regular rhythm.     Heart sounds: S1 normal and S2 normal. No murmur heard.    No friction rub. No S3 or S4 sounds.     Comments: No carotid bruits.  Carotid, radial, femoral, DP and PT pulses normal and equal.   Pulmonary:     Effort: Pulmonary effort is normal.     Breath sounds: Normal breath sounds and air entry.  Chest:  Breasts:    Right: No inverted nipple, mass or nipple discharge.     Left: No inverted nipple, mass or nipple discharge.  Abdominal:     General: Bowel sounds are normal.     Palpations: Abdomen is soft. There is no hepatomegaly, splenomegaly or  mass.     Tenderness: There is no abdominal tenderness.     Hernia: No hernia is present.  Genitourinary:    Comments: Deferred with recent surgery and follow up tomorrow with gynecology Musculoskeletal:        General: Normal range of motion.     Cervical back: Normal range of motion and neck supple.     Right lower leg: No edema.     Left lower leg: No edema.  Lymphadenopathy:     Head:     Right side of head: No submental or submandibular adenopathy.     Left side of head: No submental or submandibular adenopathy.     Cervical: No cervical adenopathy.     Upper Body:     Right upper body: No supraclavicular or axillary adenopathy.     Left upper body: No supraclavicular or axillary adenopathy.     Lower Body: No right inguinal adenopathy. No left inguinal adenopathy.  Skin:    General: Skin is warm.     Capillary Refill: Capillary refill takes less than 2 seconds.     Findings: Lesion (acne on back, mild) present. No rash.  Neurological:     General: No focal deficit present.     Mental Status: She is alert and oriented to person, place, and time.     Cranial Nerves: Cranial nerves 2-12 are intact.     Sensory: Sensation is intact.     Motor: Motor function is intact.     Coordination: Coordination is intact.     Gait: Gait is intact.     Deep Tendon Reflexes: Reflexes are normal and symmetric.  Psychiatric:        Speech: Speech normal.        Behavior: Behavior normal. Behavior is cooperative.      Assessment & Plan   CPE without pap FIT to return in 2 weeks. Influenza vaccine CBC, CMP, A1C, FLP, TSH, Hep C screen  2.  Anemia with menorrhagia and fibroid uterus:  recent myomectomy and endometrial ablation.  Has follow up with gynecology tomorrow.  CBC  3.  Prediabetes:  A1C  4.  Low HDL:  FLP  5.  Hypothyroidism:  TSH in November normalized with replacement of T4.  TSH  6.  Need for dental care:  dental referral  7.  Decreased Visual acuity:  optometry  referral.

## 2023-05-04 ENCOUNTER — Encounter: Payer: Self-pay | Admitting: Obstetrics and Gynecology

## 2023-05-04 ENCOUNTER — Ambulatory Visit (INDEPENDENT_AMBULATORY_CARE_PROVIDER_SITE_OTHER): Payer: Self-pay | Admitting: Obstetrics and Gynecology

## 2023-05-04 ENCOUNTER — Other Ambulatory Visit: Payer: Self-pay

## 2023-05-04 VITALS — BP 118/72 | HR 69 | Wt 161.2 lb

## 2023-05-04 DIAGNOSIS — N939 Abnormal uterine and vaginal bleeding, unspecified: Secondary | ICD-10-CM

## 2023-05-04 LAB — COMPREHENSIVE METABOLIC PANEL
ALT: 20 [IU]/L (ref 0–32)
AST: 25 [IU]/L (ref 0–40)
Albumin: 4.4 g/dL (ref 3.9–4.9)
Alkaline Phosphatase: 77 [IU]/L (ref 44–121)
BUN/Creatinine Ratio: 11 (ref 9–23)
BUN: 10 mg/dL (ref 6–24)
Bilirubin Total: 0.6 mg/dL (ref 0.0–1.2)
CO2: 21 mmol/L (ref 20–29)
Calcium: 8.7 mg/dL (ref 8.7–10.2)
Chloride: 106 mmol/L (ref 96–106)
Creatinine, Ser: 0.88 mg/dL (ref 0.57–1.00)
Globulin, Total: 2.4 g/dL (ref 1.5–4.5)
Glucose: 96 mg/dL (ref 70–99)
Potassium: 4.5 mmol/L (ref 3.5–5.2)
Sodium: 139 mmol/L (ref 134–144)
Total Protein: 6.8 g/dL (ref 6.0–8.5)
eGFR: 81 mL/min/{1.73_m2} (ref >59)

## 2023-05-04 LAB — CBC WITH DIFFERENTIAL/PLATELET
Basophils Absolute: 0.1 10*3/uL (ref 0.0–0.2)
Basos: 1 %
EOS (ABSOLUTE): 0.1 10*3/uL (ref 0.0–0.4)
Eos: 1 %
Hematocrit: 33.4 % — ABNORMAL LOW (ref 34.0–46.6)
Hemoglobin: 9.6 g/dL — ABNORMAL LOW (ref 11.1–15.9)
Immature Grans (Abs): 0 10*3/uL (ref 0.0–0.1)
Immature Granulocytes: 0 %
Lymphocytes Absolute: 2.2 10*3/uL (ref 0.7–3.1)
Lymphs: 32 %
MCH: 20.8 pg — ABNORMAL LOW (ref 26.6–33.0)
MCHC: 28.7 g/dL — ABNORMAL LOW (ref 31.5–35.7)
MCV: 73 fL — ABNORMAL LOW (ref 79–97)
Monocytes Absolute: 0.6 10*3/uL (ref 0.1–0.9)
Monocytes: 8 %
Neutrophils Absolute: 4.2 10*3/uL (ref 1.4–7.0)
Neutrophils: 58 %
Platelets: 435 10*3/uL (ref 150–450)
RBC: 4.61 x10E6/uL (ref 3.77–5.28)
RDW: 15.9 % — ABNORMAL HIGH (ref 11.7–15.4)
WBC: 7.1 10*3/uL (ref 3.4–10.8)

## 2023-05-04 LAB — LIPID PANEL W/O CHOL/HDL RATIO
Cholesterol, Total: 186 mg/dL (ref 100–199)
HDL: 35 mg/dL — ABNORMAL LOW (ref >39)
LDL Chol Calc (NIH): 128 mg/dL — ABNORMAL HIGH (ref 0–99)
Triglycerides: 129 mg/dL (ref 0–149)
VLDL Cholesterol Cal: 23 mg/dL (ref 5–40)

## 2023-05-04 LAB — HEPATITIS C ANTIBODY: Hep C Virus Ab: NONREACTIVE

## 2023-05-04 LAB — TSH: TSH: 3.3 u[IU]/mL (ref 0.450–4.500)

## 2023-05-04 LAB — HGB A1C W/O EAG: Hgb A1c MFr Bld: 6.2 % — ABNORMAL HIGH (ref 4.8–5.6)

## 2023-05-04 NOTE — Progress Notes (Unsigned)
   POSTOPERATIVE VISIT NOTE   Subjective:     Paula Villegas is a 48 y.o. [id number] who presents to the clinic 6 weeks status post operative hysteroscopy and endometrial ablation  for abnormal uterine bleeding and fibroids. Eating a regular diet without difficulty. Bowel movements are normal. Pain is controlled without any medications. Took medication for 4 weeks and stopped Vaginal bleeding: has had bleeding twice since procedure and it has stopped; both episodes light Interested in restarting contraception - had been on depo and for years while she was younger used copper IUD. Has pills that were previously prescribed to her and she would be ok with restarting those No CHC contraindications Has a little bit of soreness but otherwise no pain Took aygestin for 4 weeks and then stopped as instructed  The following portions of the patient's history were reviewed and updated as appropriate: allergies, current medications, past family history, past medical history, past social history, past surgical history, and problem list..   Review of Systems Pertinent items are noted in HPI.    Objective:    BP 118/72   Pulse 69   Wt 161 lb 3.2 oz (73.1 kg)   LMP 03/29/2023   BMI 29.01 kg/m  General:  alert, cooperative, and no distress  Incision:   N/a  Pelvic:   Exam deferred.    Pathology Results: FINAL MICROSCOPIC DIAGNOSIS:   A. ENDOMETRIUM, CURETTAGE AND FIBROID:  -  Disordered proliferative endometrium with as well as a focal presence  of adenomyosis.  Fragments of smooth muscle/myometrium consistent with a  submucosal leiomyoma.  -  Negative for atypia/hyperplasia.   B. CERVIX, POLYPECTOMY:  -  Scant fragment of unremarkable endocervix in the background of  abundant mucin, negative for dysplasia.    Assessment:   Doing well postoperatively. Operative findings again reviewed. Pathology report discussed.   Plan:    1. Abnormal uterine bleeding (AUB) (Primary) Meeting postop  goals Will monitor menses over the next few months to assess if endometrial ablation was successful  Will resume OCPs for contraception   Activity restrictions: none Anticipated return to work: now. Follow up: as needed  Lorriane Shire, MD Obstetrician & Gynecologist, [facility] for [employer], [facility]

## 2023-05-17 ENCOUNTER — Telehealth: Payer: Self-pay

## 2023-05-17 NOTE — Telephone Encounter (Signed)
 Telephoned patient at mobile number using language line interpreter. Left a voice message with [facility] (scholarship) contact information.

## 2023-06-03 ENCOUNTER — Ambulatory Visit
Admission: RE | Admit: 2023-06-03 | Discharge: 2023-06-03 | Disposition: A | Discharge status: Home / Self Care | Payer: Self-pay | Source: Ambulatory Visit | Attending: Internal Medicine | Admitting: Internal Medicine

## 2023-06-03 DIAGNOSIS — Z1231 Encounter for screening mammogram for malignant neoplasm of breast: Secondary | ICD-10-CM

## 2023-07-05 ENCOUNTER — Other Ambulatory Visit: Payer: Self-pay

## 2023-07-05 DIAGNOSIS — D649 Anemia, unspecified: Secondary | ICD-10-CM

## 2023-07-06 LAB — CBC WITH DIFFERENTIAL/PLATELET
Basophils Absolute: 0 10*3/uL (ref 0.0–0.2)
Basos: 1 %
EOS (ABSOLUTE): 0.1 10*3/uL (ref 0.0–0.4)
Eos: 1 %
Hematocrit: 40.5 % (ref 34.0–46.6)
Hemoglobin: 13.1 g/dL (ref 11.1–15.9)
Immature Grans (Abs): 0 10*3/uL (ref 0.0–0.1)
Immature Granulocytes: 0 %
Lymphocytes Absolute: 2.7 10*3/uL (ref 0.7–3.1)
Lymphs: 32 %
MCH: 26.3 pg — ABNORMAL LOW (ref 26.6–33.0)
MCHC: 32.3 g/dL (ref 31.5–35.7)
MCV: 81 fL (ref 79–97)
Monocytes Absolute: 0.6 10*3/uL (ref 0.1–0.9)
Monocytes: 7 %
Neutrophils Absolute: 5.1 10*3/uL (ref 1.4–7.0)
Neutrophils: 59 %
Platelets: 293 10*3/uL (ref 150–450)
RBC: 4.99 x10E6/uL (ref 3.77–5.28)
WBC: 8.5 10*3/uL (ref 3.4–10.8)

## 2023-07-22 ENCOUNTER — Encounter: Payer: Self-pay | Admitting: Internal Medicine

## 2023-09-27 ENCOUNTER — Telehealth: Payer: Self-pay | Admitting: Internal Medicine

## 2023-09-27 NOTE — Telephone Encounter (Signed)
 Patient needs appointment for cough  Symptoms are fever, sore throat and cough since Thursday . Patient states that she also feels hard to breath and has head aches.   Has taken Theraflu but did not help and also Robitussin for cough but has not helped.

## 2023-09-28 ENCOUNTER — Ambulatory Visit: Payer: Self-pay | Admitting: Internal Medicine

## 2023-09-28 ENCOUNTER — Encounter: Payer: Self-pay | Admitting: Internal Medicine

## 2023-09-28 VITALS — BP 112/76 | HR 76 | Temp 98.2°F | Resp 16 | Ht 62.5 in | Wt 162.8 lb

## 2023-09-28 DIAGNOSIS — J069 Acute upper respiratory infection, unspecified: Secondary | ICD-10-CM

## 2023-09-28 DIAGNOSIS — R051 Acute cough: Secondary | ICD-10-CM

## 2023-09-28 LAB — POC COVID19 BINAXNOW: SARS Coronavirus 2 Ag: NEGATIVE

## 2023-09-28 LAB — POCT INFLUENZA A/B
Influenza A, POC: NEGATIVE
Influenza B, POC: NEGATIVE

## 2023-09-28 MED ORDER — PROMETHAZINE-CODEINE 6.25-10 MG/5ML PO SYRP: 7.5000 mL | ORAL_SOLUTION | Freq: Four times a day (QID) | ORAL | 0 refills | Status: DC | PRN

## 2023-09-28 NOTE — Progress Notes (Signed)
    Subjective:    Patient ID: Paula Villegas, female   DOB: January 27, 1976, 48 y.o.   MRN: [id number]   HPI  Coughing with sore throat and fever started 6 days ago.  Temp up to 102, last day with fever was 2 days ago.   Cough is nonproductive. Headache, more focused on left side.   Last time took ibuprofen  was this morning at 8 a.m. Started using her albuterol  inhaler yesterday with some improvement.   Using inhaler up to 3 times through night Continues with cough and sore throat.   No ear pain or sinus pressure. Feels short of breath, but only when coughing.  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  She did hear there is something similar going around the community at a friends home beginning of last week.  History of recurrent respiratory illness just before and following episode of COVID in 2020.    Current Meds  Medication Sig   albuterol  (VENTOLIN  HFA) 108 (90 Base) MCG/ACT inhaler Inhale 2 puffs into the lungs every 6 (six) hours as needed for wheezing or shortness of breath.   ibuprofen  (ADVIL ) 200 MG tablet Take 600 mg by mouth every 6 (six) hours as needed for moderate pain.   levocetirizine (XYZAL ) 5 MG tablet Take 1 tablet (5 mg total) by mouth every evening.   levothyroxine  (SYNTHROID ) 25 MCG tablet Take 1 tablet (25 mcg total) by mouth daily.   olopatadine  (PATANOL) 0.1 % ophthalmic solution Place 1 drop into both eyes 2 (two) times daily.   No Known Allergies   Review of Systems    Objective:   BP 112/76 (BP Location: Left Arm, Patient Position: Sitting, Cuff Size: Normal)   Pulse 76   Temp 98.2 F (36.8 C) (Oral)   Resp 16   Ht 5' 2.5 (1.588 m)   Wt 162 lb 12 oz (73.8 kg)   BMI 29.29 kg/m   Physical Exam Constant dry cough throughout history and exam, especially with speaking and deep breathing. NAD otherwise/nontoxic appearing. HEENT:  Mild tenderness over frontal head.  NT over maxillary areas.  PERRL, EOMI, TMs  pearly gray, posterior pharynx with mild injection no tonsillar area erythema or exudate Neck:  Supple, no adenopathy Chest:  CTA without wheeze CV:  RRR without murmur or rub.  Radial and DP pulses normal and equal Abd:  S, NT, No HSM or mass,  Skin:  no rash  COVID and influenza A & B negative.  Assessment & Plan   URI, likely viral and improving.  Will try to control cough with phenergan  with codeine , particularly at bedtime.  She admits to already trying DM without improvement.   Push clear liquids and rest. Elevated HOB Ibuprofen  for sore throat/HA Call if worsening.

## 2023-09-28 NOTE — Telephone Encounter (Signed)
 Patient has been scheduled

## 2023-09-29 ENCOUNTER — Telehealth: Payer: Self-pay | Admitting: Internal Medicine

## 2023-09-29 NOTE — Telephone Encounter (Signed)
 Received a call from [facility] pharmacy in regards to an electronic prescription they received for promethazine -codeine , pharmacists states they do not carry that medication and was calling to let doctor know if doctor would like to send it to another pharmacy or to send different prescription for cough medication.   Pharmacy number to call back is [phone].

## 2023-09-30 MED ORDER — PROMETHAZINE-CODEINE 6.25-10 MG/5ML PO SYRP: 7.5000 mL | ORAL_SOLUTION | Freq: Four times a day (QID) | ORAL | 0 refills | Status: AC | PRN

## 2023-09-30 NOTE — Telephone Encounter (Signed)
 Called back [facility] and spoke with [employer] in shift.  Pharmacists states they called office to ask questions that are [facility] pharmacy requirements which are just to verify we sent prescription in for medication promethazine -codeine .  Pharmacists also stated that medication seems to be prescribed for an acute illness and they can not fill over 120 mil . States they cam only dispense .  Pharmacists also states she needs prescriber to answer 3 questions that are the following  -can you confirm you prescribed this medication for patient? -can you confirm patient's address? Can you confirm prescriber NPI?

## 2023-09-30 NOTE — Telephone Encounter (Signed)
 Received a voicemail from [place] from pharmacy at [facility]  call back number [phone]  Pharmacist states he has additional questions that he needs to put answers to for them to order to fill prescription

## 2023-10-15 ENCOUNTER — Telehealth: Payer: Self-pay | Admitting: Internal Medicine

## 2023-10-15 NOTE — Telephone Encounter (Signed)
 Patient called today and states she came for an appointment 3 weeks ago and was asked to call back if her symptoms did not resolved .  Patient states she has finished medication prescribed, patient finish medication  a week ago and she continues to have cough. Patient states it is not as bad as before. Patient states she continues to have the sensation in her throat like if she has something stuck.   Patient reports there is times where she feels like she can not breath and she needs to cough in order to breath.

## 2023-11-15 ENCOUNTER — Telehealth: Payer: Self-pay | Admitting: Internal Medicine

## 2023-11-15 NOTE — Telephone Encounter (Signed)
 Patient needs an appointment for,   Patient states she has rash on both of her ankles patient states she thought it was  poison ivy at first and she used creams for poison ivy but did not help,  Patient states that now rash is getting big and red , patient states rash itches a lot.  We will call patient if there is a cancellation.

## 2023-11-24 ENCOUNTER — Ambulatory Visit: Payer: Self-pay | Admitting: Internal Medicine

## 2023-11-24 VITALS — BP 118/79 | HR 71 | Resp 18 | Ht 62.0 in | Wt 164.0 lb

## 2023-11-24 DIAGNOSIS — R21 Rash and other nonspecific skin eruption: Secondary | ICD-10-CM

## 2023-11-24 DIAGNOSIS — D649 Anemia, unspecified: Secondary | ICD-10-CM

## 2023-11-24 NOTE — Telephone Encounter (Signed)
 Patient has been scheduled

## 2023-11-24 NOTE — Patient Instructions (Signed)
 Okay to finish Triamcinolone  cream twice daily or switch to your other cream with betamethasone  Cool water soaks for feet and ankles  Pat dry--no rubbing. Apply cream  Above twice daily

## 2023-11-24 NOTE — Progress Notes (Signed)
 Subjective:    Patient ID: Paula Villegas, female    DOB: March 31, 1975, 48 y.o.   MRN: [id number] I, MD in training, did initial H/P, then presented to Dr. Adella who then repeated H/P, and did final A/P, and Discharge instructions with me in the room. This is a record of both evaluations,  CC- B ankle rash 4 weeks ago noticed rash on both ankles.  Was in sister-in-law to help with branches.  2 days after being at her sisters gathering branches she noticed little macular dots that coalesced over a 3 day period while others did not coalesce.  Itchy.  No recall of bite..  She thought it was poison ivy. Put anti-histamine cream for poison ivy that sh had at home followed by beclamethasone/clotrimazol/gentamycine cream then cylopirox which is for toe fungus.  It did not get better.  Gradual onset. Now itchiness is down but 2 of the coalesed areas is hurting like it is pinching.  No f/c/nausea, worsening of chronic cough.  + HA -frontal, that started a week after and got worse with doxycycline.Frontal pressure. A week ago on 11/16/23 saw [facility]. Lyme test, [place] mountain fever tests  done and pt started on doxy 100 bid for 7 days.  11/18/23 received a notice that the lime and rocky mountain spotted fever tests came back negative.    She was told to f/u with her PMD.  Was also started on Trimcinilone which she has been using.  Iphone picture of it on 11/16/23 shows small macules, eczematous vs violacious and 1 coalesced macules medially. No rash anywhere else. No sore throat.  + runny nose 2 weeks after onset. Sexually active, last sex with husband who she is separated from  2 months ago.   Not know if he is sexually active with others. No h/o syphilis and other sexual disease.    Applying Trimamcinilone since 11/15/25 bis.  The top of the left foot is itching now.  No new scoks. Was weaing socks when picking branches.  No vaginal discharge.   No prior h/o of this. No Fhx.  Appetite is OK. Sleeping  OK.  Fatigue unchanged.  Initially had some arthalgias in knees and elbows that lasted 1 week that followed the rash.   Heel pain B 1 week prior.   Heel pain is still there worse in the morning and still present. No back ache. No eye pain. No dysrurea.  No recent travel . No xposure to others that has travelled.  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 No change in her jobs.   NO new shoes or sandals or foods :had to call pt back to ask about the shoes or foods.    +fhx of asthma.  +PMH of allergies but no eczema   Rash Associated symptoms include coughing, fatigue and rhinorrhea. Pertinent negatives include no congestion, eye pain, fever or sore throat.      Review of Systems  Constitutional:  Positive for fatigue. Negative for activity change, appetite change, chills, fever and unexpected weight change.  HENT:  Positive for rhinorrhea. Negative for congestion, ear discharge, ear pain, facial swelling, hearing loss, mouth sores, sneezing and sore throat.   Eyes:  Negative for photophobia, pain, discharge, redness, itching and visual disturbance.  Respiratory:  Positive for cough. Negative for chest tightness and wheezing.   Cardiovascular:  Negative for chest pain and leg swelling.  Genitourinary:  Negative for difficulty urinating, genital sores, urgency, vaginal discharge and  vaginal pain.  Musculoskeletal:  Positive for arthralgias. Negative for gait problem, joint swelling, myalgias and neck pain.  Skin:  Positive for rash.  Neurological:  Positive for headaches.  Psychiatric/Behavioral:  Positive for agitation (mild due to rash).    Current Meds  Medication Sig   albuterol  (VENTOLIN  HFA) 108 (90 Base) MCG/ACT inhaler Inhale 2 puffs into the lungs every 6 (six) hours as needed for wheezing or shortness of breath.   ibuprofen  (ADVIL ) 200 MG tablet Take 600 mg by mouth every 6 (six) hours as needed for moderate pain.   levocetirizine (XYZAL ) 5 MG tablet Take 1 tablet (5 mg  total) by mouth every evening.   levothyroxine  (SYNTHROID ) 25 MCG tablet Take 1 tablet (25 mcg total) by mouth daily.   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 10/20/2021   Elevated TSH 10/20/2021   Nodular goiter 07/24/2021   Seasonal allergies 07/24/2021   Menorrhagia with regular cycle 07/24/2021   Chronic left shoulder pain 07/24/2021   Overweight (BMI 25.0-29.9) 12/26/2018   Prediabetes 07/2018   Hypothyroidism 12/14/2017   Intractable back pain 06/10/2016   BV (bacterial vaginosis) 06/10/2016   Abnormal uterine bleeding 06/10/2016   Bradycardia 06/10/2016   Sciatica of right side    Ovarian cyst 03/23/2012   Appendicitis 05/22/2011   IUD migration 03/02/2011   ANEMIA-NOS 12/14/2008   Calculus of gallbladder 12/14/2008       Objective:    Vitals:   11/24/23 1702  BP: 118/79  Pulse: 71  Resp: 18    Physical Exam Vitals and nursing note reviewed.  Constitutional:      Appearance: Normal appearance. She is not ill-appearing or diaphoretic.     Comments: overweight  HENT:     Head: Normocephalic.     Right Ear: Tympanic membrane normal.     Ears:     Comments: Left TM cannot be visualized    Nose:     Comments: No obvious rhinorrhea    Mouth/Throat:     Mouth: Mucous membranes are moist.     Pharynx: Oropharynx is clear.     Comments: Slight erythema over tonsils. Cannot see back of throat Eyes:     General: No scleral icterus.    Extraocular Movements: Extraocular movements intact.     Conjunctiva/sclera:  Conjunctivae normal.     Pupils: Pupils are equal, round, and reactive to light.  Cardiovascular:     Rate and Rhythm: Normal rate and regular rhythm.     Pulses: Normal pulses.     Heart sounds: Normal heart sounds.  Pulmonary:     Effort: Pulmonary effort is normal.     Breath sounds: Normal breath sounds.     Comments: Dry cough on deep inspiration Musculoskeletal:        General: No swelling, tenderness, deformity or signs of injury. Normal range of motion.     Cervical back: Normal  range of motion.     Right lower leg: No edema.     Left lower leg: No edema.     Comments: Tenderness over B heels.   Left heel- over medial and lateral distal parts of the heel.   Right heel- tender prox heel tenderness.   Skin:    Coloration: Skin is not jaundiced or pale.     Findings: No bruising, erythema, lesion or rash.     Comments: B ankles.  Circumferencial. Eczematous type patches ( my eval) vs violatious macules ( Dr. Felix eval) with satelite maculo-papular areas around it on review of pt video of the rash at 8/26; this is not accurate as it is on iphone. Currently the lesions are hyperpigmented macules and seem to be disappearing.  A 2 by 2 cm one ankle and 1 by 1.5 the other ankle  macules that are healing but seems to be getting pt's attention.  No oozing.  Top of right foot there is slight rash.  Nothing between toes and bottom of feet or palms or neck or face or back or  stomach or top of the buttock.   Small 1 by 2 mm bump on right top part of the scalp that is mildly tender- cystlike,  occaisonal macule on the legs which pt attributes to mosquito bites  Neurological:     General: No focal deficit present.     Mental Status: She is alert and oriented to person, place, and time.  Psychiatric:        Mood and Affect: Mood normal.        Thought Content: Thought content normal.        Judgment: Judgment normal.        Assessment & Plan:  1- Rash of 1 month duration B ankles-  ressolving with steroid cream.  Viral exanthem vs eczema w Fhx of [place] and personal h/o allergies and chornic cough which may be an asthma variant vs some kind of bite.  Lyme and test for [facility] mountain spotted fever negative by outside clinic.  -Continue Trimacinolone as prescribed. Cold soaks. Pt reassured.  - test for syphilis and HIV   2- incidental flu shot due- Order to bring pt back on flu shot week.   3- incidental  chronica nemia- repeat cbc as long as pt is getting blood draw.  RTC prn/ pending lab work.

## 2023-11-25 ENCOUNTER — Encounter: Payer: Self-pay | Admitting: Internal Medicine

## 2023-11-25 ENCOUNTER — Ambulatory Visit: Payer: Self-pay | Admitting: Internal Medicine

## 2023-11-25 LAB — CBC WITH DIFFERENTIAL/PLATELET
Basophils Absolute: 0 x10E3/uL (ref 0.0–0.2)
Basos: 1 %
EOS (ABSOLUTE): 0.1 x10E3/uL (ref 0.0–0.4)
Eos: 1 %
Hematocrit: 42.4 % (ref 34.0–46.6)
Hemoglobin: 13.8 g/dL (ref 11.1–15.9)
Immature Grans (Abs): 0 x10E3/uL (ref 0.0–0.1)
Immature Granulocytes: 0 %
Lymphocytes Absolute: 2.5 x10E3/uL (ref 0.7–3.1)
Lymphs: 36 %
MCH: 30.3 pg (ref 26.6–33.0)
MCHC: 32.5 g/dL (ref 31.5–35.7)
MCV: 93 fL (ref 79–97)
Monocytes Absolute: 0.6 x10E3/uL (ref 0.1–0.9)
Monocytes: 8 %
Neutrophils Absolute: 3.7 x10E3/uL (ref 1.4–7.0)
Neutrophils: 54 %
Platelets: 287 x10E3/uL (ref 150–450)
RBC: 4.56 x10E6/uL (ref 3.77–5.28)
RDW: 13.1 % (ref 11.7–15.4)
WBC: 7 x10E3/uL (ref 3.4–10.8)

## 2023-11-25 LAB — RPR, QUANT. (REFLEX): Rapid Plasma Reagin, Quant: 1:1 {titer} — ABNORMAL HIGH

## 2023-11-25 LAB — SYPHILIS: RPR WITH REFLEX TO RPR TITER: RPR Ser Ql: REACTIVE — AB

## 2023-11-25 LAB — HIV ANTIBODY (ROUTINE TESTING W REFLEX): HIV Screen 4th Generation wRfx: NONREACTIVE

## 2023-11-29 ENCOUNTER — Other Ambulatory Visit: Payer: Self-pay

## 2023-12-22 ENCOUNTER — Telehealth: Payer: Self-pay | Admitting: Internal Medicine

## 2023-12-22 NOTE — Telephone Encounter (Signed)
 Patient would like to be seen by Doctor, patient states she was seen by doctor last month with same issue.  Patient states she has rash on her foot patient was given creams by Doctor and rash looked like it was going away as it dried and peeled skin but patient continue to have itchiness and three days ago patient states she noticed same rash again but now has more rash on her right feet, patient states continues with itchiness but also feels burning, patient states she has applied the cream previously prescribed by doctor but has not helped.

## 2023-12-23 NOTE — Telephone Encounter (Signed)
 Called patient to offer appointment, patient is unable to take appointment.

## 2023-12-27 ENCOUNTER — Other Ambulatory Visit: Payer: Self-pay

## 2023-12-27 MED ORDER — LEVOTHYROXINE SODIUM 25 MCG PO TABS: 25.0000 ug | ORAL_TABLET | Freq: Every day | ORAL | 11 refills | Status: AC

## 2023-12-29 NOTE — Telephone Encounter (Signed)
 Called patient to offer appointment, patient states symptoms have resolved

## 2024-02-07 NOTE — Progress Notes (Signed)
 Patient seen with Dr. Fleta Not clear what etiology of very localized rash around foot and ankles may be, but appears to be resolving.  Would expect most likely due to insect bites of some type.  To call if does not completely resolve with continue corticosteroid use. 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    Anemia due to previous menorrhagia and resolved after myomectomy and D & C at beginning of this year.

## 2024-05-05 ENCOUNTER — Other Ambulatory Visit: Payer: Self-pay

## 2024-05-09 ENCOUNTER — Encounter: Payer: Self-pay | Admitting: Internal Medicine
